# Patient Record
Sex: Male | Born: 1965 | Race: White | Hispanic: No | Marital: Single | Smoking: Never smoker
Health system: Southern US, Community
[De-identification: ages and names within clinical notes are randomized; demographics above are authoritative.]

## PROBLEM LIST (undated history)

## (undated) ENCOUNTER — Emergency Department (HOSPITAL_COMMUNITY): Payer: Medicaid Other

## (undated) DIAGNOSIS — S3992XA Unspecified injury of lower back, initial encounter: Secondary | ICD-10-CM

## (undated) DIAGNOSIS — E119 Type 2 diabetes mellitus without complications: Secondary | ICD-10-CM

## (undated) DIAGNOSIS — I10 Essential (primary) hypertension: Secondary | ICD-10-CM

## (undated) DIAGNOSIS — K5790 Diverticulosis of intestine, part unspecified, without perforation or abscess without bleeding: Secondary | ICD-10-CM

## (undated) DIAGNOSIS — G8929 Other chronic pain: Secondary | ICD-10-CM

## (undated) DIAGNOSIS — I639 Cerebral infarction, unspecified: Secondary | ICD-10-CM

## (undated) DIAGNOSIS — M549 Dorsalgia, unspecified: Secondary | ICD-10-CM

---

## 1999-11-16 ENCOUNTER — Emergency Department (HOSPITAL_COMMUNITY): Admission: EM | Admit: 1999-11-16 | Discharge: 1999-11-16 | Payer: Self-pay | Admitting: Emergency Medicine

## 2002-09-24 ENCOUNTER — Emergency Department (HOSPITAL_COMMUNITY): Admission: EM | Admit: 2002-09-24 | Discharge: 2002-09-24 | Payer: Self-pay | Admitting: Emergency Medicine

## 2006-03-27 ENCOUNTER — Emergency Department: Payer: Self-pay | Admitting: Emergency Medicine

## 2011-06-26 ENCOUNTER — Emergency Department (HOSPITAL_COMMUNITY): Payer: Self-pay

## 2011-06-26 ENCOUNTER — Emergency Department (HOSPITAL_COMMUNITY)
Admission: EM | Admit: 2011-06-26 | Discharge: 2011-06-27 | Disposition: A | Discharge status: Home / Self Care | Payer: No Typology Code available for payment source | Attending: Emergency Medicine | Admitting: Emergency Medicine

## 2011-06-26 DIAGNOSIS — M546 Pain in thoracic spine: Secondary | ICD-10-CM | POA: Insufficient documentation

## 2011-06-26 DIAGNOSIS — I10 Essential (primary) hypertension: Secondary | ICD-10-CM | POA: Insufficient documentation

## 2011-06-26 DIAGNOSIS — W208XXA Other cause of strike by thrown, projected or falling object, initial encounter: Secondary | ICD-10-CM | POA: Insufficient documentation

## 2011-06-26 DIAGNOSIS — R0789 Other chest pain: Secondary | ICD-10-CM | POA: Insufficient documentation

## 2011-06-26 LAB — URINALYSIS, DIPSTICK ONLY
Bilirubin Urine: NEGATIVE
Glucose, UA: NEGATIVE mg/dL
Hgb urine dipstick: NEGATIVE
Specific Gravity, Urine: 1.021 (ref 1.005–1.030)
pH: 6 (ref 5.0–8.0)

## 2011-06-26 LAB — POCT I-STAT, CHEM 8
BUN: 10 mg/dL (ref 6–23)
Chloride: 103 mEq/L (ref 96–112)
Potassium: 4.4 mEq/L (ref 3.5–5.1)
Sodium: 136 mEq/L (ref 135–145)

## 2011-06-26 LAB — CK TOTAL AND CKMB (NOT AT ARMC)
CK, MB: 2.5 ng/mL (ref 0.3–4.0)
Relative Index: 1.7 (ref 0.0–2.5)

## 2015-02-28 ENCOUNTER — Emergency Department: Payer: Self-pay | Admitting: Emergency Medicine

## 2015-02-28 LAB — CBC
HCT: 45.3 % (ref 40.0–52.0)
HGB: 15.3 g/dL (ref 13.0–18.0)
MCH: 30.1 pg (ref 26.0–34.0)
MCHC: 33.8 g/dL (ref 32.0–36.0)
MCV: 89 fL (ref 80–100)
PLATELETS: 208 10*3/uL (ref 150–440)
RBC: 5.1 10*6/uL (ref 4.40–5.90)
RDW: 13.2 % (ref 11.5–14.5)
WBC: 11.4 10*3/uL — AB (ref 3.8–10.6)

## 2015-02-28 LAB — BASIC METABOLIC PANEL
ANION GAP: 8 (ref 7–16)
BUN: 14 mg/dL
CALCIUM: 9 mg/dL
CHLORIDE: 105 mmol/L
Co2: 23 mmol/L
Creatinine: 0.75 mg/dL
EGFR (African American): >60
EGFR (Non-African Amer.): >60
GLUCOSE: 241 mg/dL — AB
POTASSIUM: 4 mmol/L
Sodium: 136 mmol/L

## 2015-02-28 LAB — TROPONIN I

## 2016-02-08 ENCOUNTER — Encounter: Payer: Self-pay | Admitting: Emergency Medicine

## 2016-02-08 ENCOUNTER — Emergency Department
Admission: EM | Admit: 2016-02-08 | Discharge: 2016-02-08 | Disposition: A | Discharge status: Home / Self Care | Payer: BLUE CROSS/BLUE SHIELD | Attending: Emergency Medicine | Admitting: Emergency Medicine

## 2016-02-08 ENCOUNTER — Emergency Department: Payer: BLUE CROSS/BLUE SHIELD

## 2016-02-08 DIAGNOSIS — X500XXA Overexertion from strenuous movement or load, initial encounter: Secondary | ICD-10-CM | POA: Diagnosis not present

## 2016-02-08 DIAGNOSIS — Y9289 Other specified places as the place of occurrence of the external cause: Secondary | ICD-10-CM | POA: Diagnosis not present

## 2016-02-08 DIAGNOSIS — I10 Essential (primary) hypertension: Secondary | ICD-10-CM | POA: Diagnosis not present

## 2016-02-08 DIAGNOSIS — M5441 Lumbago with sciatica, right side: Secondary | ICD-10-CM | POA: Diagnosis not present

## 2016-02-08 DIAGNOSIS — Y9389 Activity, other specified: Secondary | ICD-10-CM | POA: Diagnosis not present

## 2016-02-08 DIAGNOSIS — M5442 Lumbago with sciatica, left side: Secondary | ICD-10-CM | POA: Diagnosis not present

## 2016-02-08 DIAGNOSIS — Y998 Other external cause status: Secondary | ICD-10-CM | POA: Insufficient documentation

## 2016-02-08 DIAGNOSIS — S3992XA Unspecified injury of lower back, initial encounter: Secondary | ICD-10-CM | POA: Diagnosis present

## 2016-02-08 HISTORY — DX: Essential (primary) hypertension: I10

## 2016-02-08 MED ORDER — MELOXICAM 15 MG PO TABS: 15.0000 mg | ORAL_TABLET | Freq: Every day | ORAL | Status: DC

## 2016-02-08 MED ORDER — METHOCARBAMOL 500 MG PO TABS: 500.0000 mg | ORAL_TABLET | Freq: Four times a day (QID) | ORAL | Status: DC

## 2016-02-08 MED ORDER — HYDROCODONE-ACETAMINOPHEN 5-325 MG PO TABS
1.0000 | ORAL_TABLET | Freq: Once | ORAL | Status: AC
  Administered 2016-02-08: 1 via ORAL
  Filled 2016-02-08: qty 1

## 2016-02-08 MED ORDER — KETOROLAC TROMETHAMINE 60 MG/2ML IM SOLN
60.0000 mg | Freq: Once | INTRAMUSCULAR | Status: AC
  Administered 2016-02-08: 60 mg via INTRAMUSCULAR
  Filled 2016-02-08: qty 2

## 2016-02-08 NOTE — Discharge Instructions (Signed)

## 2016-02-08 NOTE — ED Notes (Signed)
Low back pain x 3 days. No new injury.

## 2016-02-08 NOTE — ED Notes (Signed)
Pt rreports that he broke his back a year ago and he has continued to have chronic back pain since then. He states that he felt a pop on Thursday when he was picking up something and he has had increased pain since then. He states it feels like burning and his feet go numb so he has to get up and walk around during the night.

## 2016-02-08 NOTE — ED Provider Notes (Signed)
[facility] Emergency Department Provider Note  ____________________________________________  Time seen: Approximately 3:14 PM  I have reviewed the triage vital signs and the nursing notes.   HISTORY  Chief Complaint Back Pain    HPI Theodore Hinton is a 50 y.o. male who presents emergency department complaining of lower back pain. Patient states that he had a broken back times one year ago. He has intermittent chronic pain but states that 4 days prior he was lifting something when he felt a "popping" sensation. He is now having radicular symptoms to bilateral lower extremities. He denies any bowel or bladder dysfunction. He denies any saddle anesthesia. Patient is complaining of increased lower back pain with radicular symptoms. It  constant, moderate to severe.   Past Medical History  Diagnosis Date  . Hypertension     There are no active problems to display for this patient.   History reviewed. No pertinent past surgical history.  Current Outpatient Rx  Name  Route  Sig  Dispense  Refill  . meloxicam (MOBIC) 15 MG tablet   Oral   Take 1 tablet (15 mg total) by mouth daily.   30 tablet   0   . methocarbamol (ROBAXIN) 500 MG tablet   Oral   Take 1 tablet (500 mg total) by mouth 4 (four) times daily.   16 tablet   0     Allergies Review of patient's allergies indicates no known allergies.  No family history on file.  Social History Social History  Substance Use Topics  . Smoking status: Never Smoker   . Smokeless tobacco: None  . Alcohol Use: Yes     Review of Systems  Constitutional: No fever/chills Cardiovascular: no chest pain. Respiratory: no cough. No SOB. Musculoskeletal:  positive for lower back pain with radicular symptoms. Skin: Negative for rash. Neurological: Negative for headaches, focal weakness or numbness. 10-point ROS otherwise negative.  ____________________________________________   PHYSICAL  EXAM:  VITAL SIGNS: ED Triage Vitals  Enc Vitals Group     BP 02/08/16 1248 161/101 mmHg     Pulse Rate 02/08/16 1248 86     Resp 02/08/16 1248 18     Temp 02/08/16 1248 98 F (36.7 C)     Temp Source 02/08/16 1248 Oral     SpO2 02/08/16 1248 98 %     Weight 02/08/16 1248 212 lb (96.163 kg)     Height 02/08/16 1248 5\' 8"  (1.727 m)     Head Cir --      Peak Flow --      Pain Score 02/08/16 1252 10     Pain Loc --      Pain Edu? --      Excl. in GC? --      Constitutional: Alert and oriented. Well appearing and in no acute distress. Eyes: Conjunctivae are normal. PERRL. EOMI. Head: Atraumatic. Cardiovascular: Normal rate, regular rhythm. Normal S1 and S2.  Good peripheral circulation. Respiratory: Normal respiratory effort without tachypnea or retractions. Lungs CTAB. Musculoskeletal:  no visible deformity to 2 spine upon inspection. Patient is tender to palpation midline spinal processes in the L1-L2 range. No palpable abnormality. Patient is tender to palpation of bilateral sciatic notches. Positive straight leg raise on the right negative on left. Dorsalis pedis pulses appreciated bilaterally lower extremities. Sensation equal and intact distal lower extremity. Neurologic:  Normal speech and language. No gross focal neurologic deficits are appreciated.  Skin:  Skin is warm, dry and intact. No rash  noted. Psychiatric: Mood and affect are normal. Speech and behavior are normal. Patient exhibits appropriate insight and judgement.   ____________________________________________   LABS (all labs ordered are listed, but only abnormal results are displayed)  Labs Reviewed - No data to display ____________________________________________  EKG   ____________________________________________  RADIOLOGY Festus Barren Jahnessa Vanduyn, personally viewed and evaluated these images (plain radiographs) as part of my medical decision making, as well as reviewing the written report by the  radiologist.  Dg Lumbar Spine Complete  02/08/2016  CLINICAL DATA:  Back pain since lifting four days ago. History of lumbar spine fracture in 2016. EXAM: LUMBAR SPINE - COMPLETE 4+ VIEW COMPARISON:  Radiographs and CT 02/28/2015. FINDINGS: There is no progressive loss of vertebral body height at the previously demonstrated L1 fracture. No evidence of acute lumbar spine fracture or progressive malalignment. There is stable lumbar spondylosis with mild loss of disc height and facet hypertrophy inferiorly. Paraspinal osteophytes are present on the right at L2-3. There is mild aortoiliac atherosclerosis. IMPRESSION: No acute osseous findings. Stable appearance of L1 compression deformity. Electronically Signed   By: Carey Bullocks M.D.   On: 02/08/2016 15:06    ____________________________________________    PROCEDURES  Procedure(s) performed:       Medications  HYDROcodone-acetaminophen (NORCO/VICODIN) 5-325 MG per tablet 1 tablet (1 tablet Oral Given 02/08/16 1429)  ketorolac (TORADOL) injection 60 mg (60 mg Intramuscular Given 02/08/16 1534)     ____________________________________________   INITIAL IMPRESSION / ASSESSMENT AND PLAN / ED COURSE  Pertinent labs & imaging results that were available during my care of the patient were reviewed by me and considered in my medical decision making (see chart for details).  Patient's diagnosis is consistent wilumbago with bilateral radicular symptoms. Negative x-ray here in the emergency department for any change in his compression fracture. Patient is given a shot of Toradol here in the emergency department. Patient will be discharged home with prescriptions for muscle relaxers and anti-inflammatories for symptom control. Patient is to follow up with his orthopedic surgeon if symptoms persist past this treatment course. Patient is given ED precautions to return to the ED for any worsening or new  symptoms.     ____________________________________________  FINAL CLINICAL IMPRESSION(S) / ED DIAGNOSES  Final diagnoses:  Acute midline low back pain with bilateral sciatica      NEW MEDICATIONS STARTED DURING THIS VISIT:  New Prescriptions   MELOXICAM (MOBIC) 15 MG TABLET    Take 1 tablet (15 mg total) by mouth daily.   METHOCARBAMOL (ROBAXIN) 500 MG TABLET    Take 1 tablet (500 mg total) by mouth 4 (four) times daily.        This chart was dictated using voice recognition software/Dragon. Despite best efforts to proofread, errors can occur which can change the meaning. Any change was purely unintentional.    Racheal Patches, PA-C 02/08/16 1554  Emily Filbert, MD 02/09/16 [phone]

## 2016-03-16 ENCOUNTER — Other Ambulatory Visit: Payer: Self-pay | Admitting: Orthopedic Surgery

## 2016-03-16 DIAGNOSIS — M5440 Lumbago with sciatica, unspecified side: Secondary | ICD-10-CM

## 2016-03-28 ENCOUNTER — Ambulatory Visit
Admission: RE | Admit: 2016-03-28 | Discharge: 2016-03-28 | Disposition: A | Discharge status: Home / Self Care | Payer: BLUE CROSS/BLUE SHIELD | Source: Ambulatory Visit | Attending: Orthopedic Surgery | Admitting: Orthopedic Surgery

## 2016-03-28 DIAGNOSIS — M5136 Other intervertebral disc degeneration, lumbar region: Secondary | ICD-10-CM | POA: Insufficient documentation

## 2016-03-28 DIAGNOSIS — M4806 Spinal stenosis, lumbar region: Secondary | ICD-10-CM | POA: Diagnosis not present

## 2016-03-28 DIAGNOSIS — M5440 Lumbago with sciatica, unspecified side: Secondary | ICD-10-CM

## 2016-03-28 DIAGNOSIS — M4856XA Collapsed vertebra, not elsewhere classified, lumbar region, initial encounter for fracture: Secondary | ICD-10-CM | POA: Diagnosis not present

## 2016-03-28 DIAGNOSIS — M5146 Schmorl's nodes, lumbar region: Secondary | ICD-10-CM | POA: Insufficient documentation

## 2016-03-28 DIAGNOSIS — M545 Low back pain: Secondary | ICD-10-CM | POA: Insufficient documentation

## 2016-04-06 ENCOUNTER — Other Ambulatory Visit: Payer: Self-pay | Admitting: Orthopedic Surgery

## 2016-04-06 DIAGNOSIS — M545 Low back pain: Secondary | ICD-10-CM

## 2016-04-06 DIAGNOSIS — M5126 Other intervertebral disc displacement, lumbar region: Secondary | ICD-10-CM

## 2016-04-06 DIAGNOSIS — M5136 Other intervertebral disc degeneration, lumbar region: Secondary | ICD-10-CM

## 2016-04-06 DIAGNOSIS — G8929 Other chronic pain: Secondary | ICD-10-CM

## 2016-04-12 ENCOUNTER — Ambulatory Visit
Admission: RE | Admit: 2016-04-12 | Discharge: 2016-04-12 | Disposition: A | Discharge status: Home / Self Care | Payer: Self-pay | Source: Ambulatory Visit | Attending: Orthopedic Surgery | Admitting: Orthopedic Surgery

## 2016-04-12 DIAGNOSIS — M545 Low back pain, unspecified: Secondary | ICD-10-CM

## 2016-04-12 DIAGNOSIS — M5126 Other intervertebral disc displacement, lumbar region: Secondary | ICD-10-CM

## 2016-04-12 DIAGNOSIS — M5136 Other intervertebral disc degeneration, lumbar region: Secondary | ICD-10-CM

## 2016-04-12 DIAGNOSIS — G8929 Other chronic pain: Secondary | ICD-10-CM

## 2016-04-12 MED ORDER — METHYLPREDNISOLONE ACETATE 40 MG/ML INJ SUSP (RADIOLOG
120.0000 mg | Freq: Once | INTRAMUSCULAR | Status: AC
  Administered 2016-04-12: 120 mg via EPIDURAL

## 2016-04-12 MED ORDER — IOPAMIDOL (ISOVUE-M 200) INJECTION 41%
1.0000 mL | Freq: Once | INTRAMUSCULAR | Status: AC
  Administered 2016-04-12: 1 mL via EPIDURAL

## 2016-04-12 NOTE — Discharge Instructions (Signed)

## 2016-07-19 ENCOUNTER — Emergency Department (HOSPITAL_COMMUNITY)
Admission: EM | Admit: 2016-07-19 | Discharge: 2016-07-19 | Disposition: A | Discharge status: Home / Self Care | Payer: Self-pay | Attending: Emergency Medicine | Admitting: Emergency Medicine

## 2016-07-19 ENCOUNTER — Emergency Department (HOSPITAL_COMMUNITY): Payer: Self-pay

## 2016-07-19 ENCOUNTER — Encounter (HOSPITAL_COMMUNITY): Payer: Self-pay | Admitting: Emergency Medicine

## 2016-07-19 DIAGNOSIS — R52 Pain, unspecified: Secondary | ICD-10-CM

## 2016-07-19 DIAGNOSIS — M5440 Lumbago with sciatica, unspecified side: Secondary | ICD-10-CM | POA: Insufficient documentation

## 2016-07-19 DIAGNOSIS — I10 Essential (primary) hypertension: Secondary | ICD-10-CM | POA: Insufficient documentation

## 2016-07-19 DIAGNOSIS — Z79899 Other long term (current) drug therapy: Secondary | ICD-10-CM | POA: Insufficient documentation

## 2016-07-19 DIAGNOSIS — K921 Melena: Secondary | ICD-10-CM | POA: Insufficient documentation

## 2016-07-19 HISTORY — DX: Unspecified injury of lower back, initial encounter: S39.92XA

## 2016-07-19 LAB — COMPREHENSIVE METABOLIC PANEL
ALBUMIN: 4 g/dL (ref 3.5–5.0)
ALK PHOS: 68 U/L (ref 38–126)
ALT: 35 U/L (ref 17–63)
ANION GAP: 8 (ref 5–15)
AST: 26 U/L (ref 15–41)
BILIRUBIN TOTAL: 1.8 mg/dL — AB (ref 0.3–1.2)
BUN: 11 mg/dL (ref 6–20)
CALCIUM: 9 mg/dL (ref 8.9–10.3)
CO2: 23 mmol/L (ref 22–32)
CREATININE: 0.83 mg/dL (ref 0.61–1.24)
Chloride: 102 mmol/L (ref 101–111)
GFR calc Af Amer: >60 mL/min (ref >60)
GFR calc non Af Amer: >60 mL/min (ref >60)
GLUCOSE: 259 mg/dL — AB (ref 65–99)
Potassium: 3.6 mmol/L (ref 3.5–5.1)
Sodium: 133 mmol/L — ABNORMAL LOW (ref 135–145)
TOTAL PROTEIN: 7.3 g/dL (ref 6.5–8.1)

## 2016-07-19 LAB — URINALYSIS, ROUTINE W REFLEX MICROSCOPIC
Glucose, UA: NEGATIVE mg/dL
Hgb urine dipstick: NEGATIVE
Leukocytes, UA: NEGATIVE
NITRITE: NEGATIVE
PROTEIN: 30 mg/dL — AB
Specific Gravity, Urine: 1.025 (ref 1.005–1.030)
pH: 6 (ref 5.0–8.0)

## 2016-07-19 LAB — CBC
HCT: 52.1 % — ABNORMAL HIGH (ref 39.0–52.0)
HEMOGLOBIN: 18.9 g/dL — AB (ref 13.0–17.0)
MCH: 31.9 pg (ref 26.0–34.0)
MCHC: 36.3 g/dL — AB (ref 30.0–36.0)
MCV: 88 fL (ref 78.0–100.0)
PLATELETS: 221 10*3/uL (ref 150–400)
RBC: 5.92 MIL/uL — ABNORMAL HIGH (ref 4.22–5.81)
RDW: 12.9 % (ref 11.5–15.5)
WBC: 11.4 10*3/uL — ABNORMAL HIGH (ref 4.0–10.5)

## 2016-07-19 LAB — URINE MICROSCOPIC-ADD ON

## 2016-07-19 MED ORDER — PREDNISONE 10 MG PO TABS: 20.0000 mg | ORAL_TABLET | Freq: Every day | ORAL | 0 refills | Status: DC

## 2016-07-19 MED ORDER — ONDANSETRON 4 MG PO TBDP
ORAL_TABLET | ORAL | 0 refills | Status: DC
Start: 2016-07-19 — End: 2020-12-01

## 2016-07-19 MED ORDER — HYDROMORPHONE HCL 1 MG/ML IJ SOLN
1.0000 mg | Freq: Once | INTRAMUSCULAR | Status: AC
  Administered 2016-07-19: 1 mg via INTRAVENOUS
  Filled 2016-07-19: qty 1

## 2016-07-19 MED ORDER — HYDROCODONE-ACETAMINOPHEN 5-325 MG PO TABS: 1.0000 | ORAL_TABLET | Freq: Four times a day (QID) | ORAL | 0 refills | Status: DC | PRN

## 2016-07-19 MED ORDER — LISINOPRIL 20 MG PO TABS: 20.0000 mg | ORAL_TABLET | Freq: Every day | ORAL | 1 refills | Status: DC

## 2016-07-19 MED ORDER — LISINOPRIL 10 MG PO TABS
20.0000 mg | ORAL_TABLET | Freq: Once | ORAL | Status: AC
  Administered 2016-07-19: 20 mg via ORAL
  Filled 2016-07-19: qty 2

## 2016-07-19 MED ORDER — METHYLPREDNISOLONE SODIUM SUCC 125 MG IJ SOLR
125.0000 mg | Freq: Once | INTRAMUSCULAR | Status: AC
  Administered 2016-07-19: 125 mg via INTRAVENOUS
  Filled 2016-07-19: qty 2

## 2016-07-19 MED ORDER — ONDANSETRON HCL 4 MG/2ML IJ SOLN
4.0000 mg | Freq: Once | INTRAMUSCULAR | Status: AC
  Administered 2016-07-19: 4 mg via INTRAVENOUS
  Filled 2016-07-19: qty 2

## 2016-07-19 NOTE — ED Triage Notes (Signed)
Patient complaining of back pain x 2 days with rectal bleeding starting today. States he has history of back problems.

## 2016-07-19 NOTE — Discharge Instructions (Signed)
Follow-up with your family doctor next week. He needs to check your back your blood pressure and keep an eye on your sugar.

## 2016-07-19 NOTE — ED Provider Notes (Signed)
[facility]-EMERGENCY DEPT Provider Note   CSN: [id number] Arrival date & time: 07/19/16  1706     History   Chief Complaint Chief Complaint  Patient presents with  . Back Pain  . Rectal Bleeding    HPI Theodore Hinton is a 50 y.o. male.  Patient complains of pain in his lower back radiating down both legs. Patient has had long history of back problems and has had injections in his back before   The history is provided by the patient. No language interpreter was used.  Back Pain   This is a recurrent problem. The problem occurs constantly. The problem has not changed since onset.The pain is associated with no known injury. The pain is present in the lumbar spine. The quality of the pain is described as burning and aching. Radiates to: Radiates down both legs. The pain is at a severity of 6/10. The pain is moderate. The symptoms are aggravated by bending. The pain is the same all the time. Pertinent negatives include no chest pain, no headaches and no abdominal pain. He has tried NSAIDs for the symptoms. The treatment provided no relief. Risk factors include lack of exercise.  Rectal Bleeding  Associated symptoms: no abdominal pain     Past Medical History:  Diagnosis Date  . Back injury   . Hypertension     There are no active problems to display for this patient.   History reviewed. No pertinent surgical history.     Home Medications    Prior to Admission medications   Medication Sig Start Date End Date Taking? Authorizing Provider  gabapentin (NEURONTIN) 300 MG capsule Take by mouth. 03/09/16 05/08/16  Historical Provider, MD  HYDROcodone-acetaminophen (NORCO/VICODIN) 5-325 MG tablet Take 1 tablet by mouth every 6 (six) hours as needed. 07/19/16   Bethann BerkshireJoseph Amylia Collazos, MD  lisinopril (PRINIVIL,ZESTRIL) 20 MG tablet Take 1 tablet (20 mg total) by mouth daily. 07/19/16   Bethann BerkshireJoseph Elior Robinette, MD  meloxicam (MOBIC) 15 MG tablet Take 1 tablet (15 mg total) by mouth daily. 02/08/16   Delorise RoyalsJonathan  D Cuthriell, PA-C  methocarbamol (ROBAXIN) 500 MG tablet Take 1 tablet (500 mg total) by mouth 4 (four) times daily. 02/08/16   Delorise RoyalsJonathan D Cuthriell, PA-C  ondansetron (ZOFRAN ODT) 4 MG disintegrating tablet 4mg  ODT q4 hours prn nausea/vomit 07/19/16   Bethann BerkshireJoseph Edmund Holcomb, MD  predniSONE (DELTASONE) 10 MG tablet Take 2 tablets (20 mg total) by mouth daily. 07/19/16   Bethann BerkshireJoseph Makailyn Mccormick, MD  traMADol (ULTRAM) 50 MG tablet Take by mouth. 03/09/16   Historical Provider, MD    Family History History reviewed. No pertinent family history.  Social History Social History  Substance Use Topics  . Smoking status: Never Smoker  . Smokeless tobacco: Current User    Types: Chew  . Alcohol use Yes     Comment: occasionally     Allergies   Review of patient's allergies indicates no known allergies.   Review of Systems Review of Systems  Constitutional: Negative for appetite change and fatigue.  HENT: Negative for congestion, ear discharge and sinus pressure.   Eyes: Negative for discharge.  Respiratory: Negative for cough.   Cardiovascular: Negative for chest pain.  Gastrointestinal: Positive for hematochezia. Negative for abdominal pain and diarrhea.  Genitourinary: Negative for frequency and hematuria.  Musculoskeletal: Positive for back pain.  Skin: Negative for rash.  Neurological: Negative for seizures and headaches.  Psychiatric/Behavioral: Negative for hallucinations.     Physical Exam Updated Vital Signs BP (S) (!) 159/118 (BP  Location: Left Arm) Comment: taken twice for validation  Pulse 94   Temp 98.9 F (37.2 C) (Oral)   Resp 16   Ht 5\' 8"  (1.727 m)   Wt 198 lb (89.8 kg)   SpO2 99%   BMI 30.11 kg/m   Physical Exam  Constitutional: He is oriented to person, place, and time. He appears well-developed.  HENT:  Head: Normocephalic.  Eyes: Conjunctivae and EOM are normal. No scleral icterus.  Neck: Neck supple. No thyromegaly present.  Cardiovascular: Normal rate and regular  rhythm.  Exam reveals no gallop and no friction rub.   No murmur heard. Pulmonary/Chest: No stridor. He has no wheezes. He has no rales. He exhibits no tenderness.  Abdominal: He exhibits no distension. There is no tenderness. There is no rebound.  Musculoskeletal: Normal range of motion. He exhibits tenderness. He exhibits no edema.  Tenderness to lumbar spine,  patient has positive straight leg raise to both legs. Normal strength  Lymphadenopathy:    He has no cervical adenopathy.  Neurological: He is oriented to person, place, and time. He exhibits normal muscle tone. Coordination normal.  Skin: No rash noted. No erythema.  Psychiatric: He has a normal mood and affect. His behavior is normal.     ED Treatments / Results  Labs (all labs ordered are listed, but only abnormal results are displayed) Labs Reviewed  COMPREHENSIVE METABOLIC PANEL - Abnormal; Notable for the following:       Result Value   Sodium 133 (*)    Glucose, Bld 259 (*)    Total Bilirubin 1.8 (*)    All other components within normal limits  CBC - Abnormal; Notable for the following:    WBC 11.4 (*)    RBC 5.92 (*)    Hemoglobin 18.9 (*)    HCT 52.1 (*)    MCHC 36.3 (*)    All other components within normal limits  URINALYSIS, ROUTINE W REFLEX MICROSCOPIC (NOT AT [facility]) - Abnormal; Notable for the following:    Bilirubin Urine MODERATE (*)    Ketones, ur TRACE (*)    Protein, ur 30 (*)    All other components within normal limits  URINE MICROSCOPIC-ADD ON - Abnormal; Notable for the following:    Squamous Epithelial / LPF 6-30 (*)    Bacteria, UA RARE (*)    All other components within normal limits  TYPE AND SCREEN    EKG  EKG Interpretation None       Radiology Mr Lumbar Spine Wo Contrast  Result Date: 07/19/2016 CLINICAL DATA:  Low back pain over the last few days without known injury. EXAM: MRI LUMBAR SPINE WITHOUT CONTRAST TECHNIQUE: Multiplanar, multisequence MR imaging of the lumbar spine  was performed. No intravenous contrast was administered. COMPARISON:  MRI of the lumbar spine 03/28/2016. FINDINGS: Segmentation: 5 non rib-bearing lumbar type vertebral bodies are present. Alignment: AP alignment is anatomic. Mild leftward curvature is centered at L2. Vertebrae: A remote superior endplate compression fracture is noted at L1. Edematous changes along the left inferior aspect of L4 associated with a Schmorl's node, similar to the prior study. Conus medullaris: Extends to the L1-2 level and appears normal. Paraspinal and other soft tissues: Limited imaging of the abdomen is unremarkable. There is no significant adenopathy. The paraspinous musculature is within normal limits. Disc levels: T12-L1:  Negative. L1-2: Mild facet hypertrophy is present without significant stenosis. L2-3: A broad-based disc protrusion is asymmetric to the right. Mild subarticular narrowing is present bilaterally. The  foramina are patent. L3-4: A broad-based disc protrusion is present. Mild facet hypertrophy is noted bilaterally. Mild central and foraminal narrowing bilaterally is stable. L4-5: A broad-based disc protrusion is present. Short pedicles and facet hypertrophy contribute to moderate subarticular narrowing bilaterally. Mild foraminal narrowing bilaterally is stable. L5-S1: A mild broad-based disc protrusion is present. Facet hypertrophy and spurring is worse right than left. Mild subarticular narrowing is worse on the right. Moderate right and mild left foraminal stenosis is stable. IMPRESSION: 1. 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 2. Mild subarticular narrowing bilaterally at L2-3. 3. Mild central and bilateral foraminal stenosis at L3-4. 4. Moderate subarticular and mild foraminal narrowing bilaterally at L4-5. 5. Mild subarticular narrowing at L5-S1 is worse on the right. 6. Moderate right and mild left foraminal stenosis at L5-S1 is stable.  Electronically Signed   By: Marin Robertshristopher  Mattern M.D.   On: 07/19/2016 20:38    Procedures Procedures (including critical care time)  Medications Ordered in ED Medications  lisinopril (PRINIVIL,ZESTRIL) tablet 20 mg (not administered)  HYDROmorphone (DILAUDID) injection 1 mg (1 mg Intravenous Given 07/19/16 1945)  ondansetron (ZOFRAN) injection 4 mg (4 mg Intravenous Given 07/19/16 1945)  methylPREDNISolone sodium succinate (SOLU-MEDROL) 125 mg/2 mL injection 125 mg (125 mg Intravenous Given 07/19/16 1945)     Initial Impression / Assessment and Plan / ED Course  I have reviewed the triage vital signs and the nursing notes.  Pertinent labs & imaging results that were available during my care of the patient were reviewed by me and considered in my medical decision making (see chart for details).  Clinical Course    MRI shows chronic problems with back. No acute changes. Patient will be put on steroids and pain medicine and nausea medicine. Patient's blood pressure is elevated he will be started on lisinopril. He states he's been told numerous times blood pressure has been elevated but has not started any medicine. He also has an elevated glucose he will follow-up with his PCP about that. Patient will follow-up with PCP next week  Final Clinical Impressions(s) / ED Diagnoses   Final diagnoses:  Pain  Midline low back pain with sciatica, sciatica laterality unspecified  HTN (hypertension), benign    New Prescriptions New Prescriptions   HYDROCODONE-ACETAMINOPHEN (NORCO/VICODIN) 5-325 MG TABLET    Take 1 tablet by mouth every 6 (six) hours as needed.   LISINOPRIL (PRINIVIL,ZESTRIL) 20 MG TABLET    Take 1 tablet (20 mg total) by mouth daily.   ONDANSETRON (ZOFRAN ODT) 4 MG DISINTEGRATING TABLET    4mg  ODT q4 hours prn nausea/vomit   PREDNISONE (DELTASONE) 10 MG TABLET    Take 2 tablets (20 mg total) by mouth daily.     Bethann BerkshireJoseph Vishnu Moeller, MD 07/19/16 2200

## 2016-07-20 ENCOUNTER — Emergency Department (HOSPITAL_COMMUNITY)
Admission: EM | Admit: 2016-07-20 | Discharge: 2016-07-20 | Disposition: A | Discharge status: Home / Self Care | Payer: Self-pay | Attending: Emergency Medicine | Admitting: Emergency Medicine

## 2016-07-20 ENCOUNTER — Encounter (HOSPITAL_COMMUNITY): Payer: Self-pay | Admitting: Cardiology

## 2016-07-20 ENCOUNTER — Emergency Department (HOSPITAL_COMMUNITY): Payer: Self-pay

## 2016-07-20 DIAGNOSIS — I10 Essential (primary) hypertension: Secondary | ICD-10-CM | POA: Insufficient documentation

## 2016-07-20 DIAGNOSIS — R197 Diarrhea, unspecified: Secondary | ICD-10-CM

## 2016-07-20 DIAGNOSIS — F17220 Nicotine dependence, chewing tobacco, uncomplicated: Secondary | ICD-10-CM | POA: Insufficient documentation

## 2016-07-20 DIAGNOSIS — G8929 Other chronic pain: Secondary | ICD-10-CM | POA: Insufficient documentation

## 2016-07-20 DIAGNOSIS — K5792 Diverticulitis of intestine, part unspecified, without perforation or abscess without bleeding: Secondary | ICD-10-CM | POA: Insufficient documentation

## 2016-07-20 DIAGNOSIS — Z79899 Other long term (current) drug therapy: Secondary | ICD-10-CM | POA: Insufficient documentation

## 2016-07-20 DIAGNOSIS — R739 Hyperglycemia, unspecified: Secondary | ICD-10-CM | POA: Insufficient documentation

## 2016-07-20 DIAGNOSIS — R112 Nausea with vomiting, unspecified: Secondary | ICD-10-CM

## 2016-07-20 DIAGNOSIS — M549 Dorsalgia, unspecified: Secondary | ICD-10-CM | POA: Insufficient documentation

## 2016-07-20 HISTORY — DX: Other chronic pain: G89.29

## 2016-07-20 HISTORY — DX: Diverticulosis of intestine, part unspecified, without perforation or abscess without bleeding: K57.90

## 2016-07-20 HISTORY — DX: Dorsalgia, unspecified: M54.9

## 2016-07-20 LAB — COMPREHENSIVE METABOLIC PANEL
ALT: 40 U/L (ref 17–63)
AST: 25 U/L (ref 15–41)
Albumin: 4.3 g/dL (ref 3.5–5.0)
Alkaline Phosphatase: 69 U/L (ref 38–126)
Anion gap: 13 (ref 5–15)
BUN: 13 mg/dL (ref 6–20)
CHLORIDE: 98 mmol/L — AB (ref 101–111)
CO2: 22 mmol/L (ref 22–32)
CREATININE: 0.84 mg/dL (ref 0.61–1.24)
Calcium: 10.9 mg/dL — ABNORMAL HIGH (ref 8.9–10.3)
GFR calc Af Amer: >60 mL/min (ref >60)
GLUCOSE: 356 mg/dL — AB (ref 65–99)
Potassium: 3.6 mmol/L (ref 3.5–5.1)
Sodium: 133 mmol/L — ABNORMAL LOW (ref 135–145)
Total Bilirubin: 1.6 mg/dL — ABNORMAL HIGH (ref 0.3–1.2)
Total Protein: 7.8 g/dL (ref 6.5–8.1)

## 2016-07-20 LAB — CBC WITH DIFFERENTIAL/PLATELET
Basophils Absolute: 0 10*3/uL (ref 0.0–0.1)
Basophils Relative: 0 %
Eosinophils Absolute: 0 10*3/uL (ref 0.0–0.7)
Eosinophils Relative: 0 %
HEMATOCRIT: 54.5 % — AB (ref 39.0–52.0)
Hemoglobin: 19.9 g/dL — ABNORMAL HIGH (ref 13.0–17.0)
LYMPHS ABS: 2.3 10*3/uL (ref 0.7–4.0)
LYMPHS PCT: 16 %
MCH: 31.7 pg (ref 26.0–34.0)
MCHC: 36.5 g/dL — AB (ref 30.0–36.0)
MCV: 86.8 fL (ref 78.0–100.0)
MONO ABS: 0.7 10*3/uL (ref 0.1–1.0)
MONOS PCT: 5 %
NEUTROS ABS: 11.1 10*3/uL — AB (ref 1.7–7.7)
Neutrophils Relative %: 79 %
Platelets: 257 10*3/uL (ref 150–400)
RBC: 6.28 MIL/uL — ABNORMAL HIGH (ref 4.22–5.81)
RDW: 12.8 % (ref 11.5–15.5)
WBC: 14 10*3/uL — ABNORMAL HIGH (ref 4.0–10.5)

## 2016-07-20 LAB — POC OCCULT BLOOD, ED: Fecal Occult Bld: NEGATIVE

## 2016-07-20 LAB — URINALYSIS, ROUTINE W REFLEX MICROSCOPIC
BILIRUBIN URINE: NEGATIVE
GLUCOSE, UA: 500 mg/dL — AB
Hgb urine dipstick: NEGATIVE
KETONES UR: NEGATIVE mg/dL
Leukocytes, UA: NEGATIVE
Nitrite: NEGATIVE
Protein, ur: NEGATIVE mg/dL
pH: 6.5 (ref 5.0–8.0)

## 2016-07-20 LAB — CBG MONITORING, ED: GLUCOSE-CAPILLARY: 266 mg/dL — AB (ref 65–99)

## 2016-07-20 LAB — LIPASE, BLOOD: LIPASE: 33 U/L (ref 11–51)

## 2016-07-20 LAB — TROPONIN I: Troponin I: <0.03 ng/mL (ref <0.03)

## 2016-07-20 MED ORDER — IOPAMIDOL (ISOVUE-300) INJECTION 61%
100.0000 mL | Freq: Once | INTRAVENOUS | Status: AC | PRN
  Administered 2016-07-20: 100 mL via INTRAVENOUS

## 2016-07-20 MED ORDER — DIATRIZOATE MEGLUMINE & SODIUM 66-10 % PO SOLN
ORAL | Status: AC
  Filled 2016-07-20: qty 30

## 2016-07-20 MED ORDER — SODIUM CHLORIDE 0.9 % IV BOLUS (SEPSIS)
1000.0000 mL | Freq: Once | INTRAVENOUS | Status: AC
  Administered 2016-07-20: 1000 mL via INTRAVENOUS

## 2016-07-20 MED ORDER — LISINOPRIL 10 MG PO TABS
10.0000 mg | ORAL_TABLET | Freq: Once | ORAL | Status: AC
  Administered 2016-07-20: 10 mg via ORAL
  Filled 2016-07-20: qty 1

## 2016-07-20 MED ORDER — FENTANYL CITRATE (PF) 100 MCG/2ML IJ SOLN
50.0000 ug | INTRAMUSCULAR | Status: DC | PRN
  Administered 2016-07-20: 50 ug via INTRAVENOUS
  Filled 2016-07-20: qty 2

## 2016-07-20 MED ORDER — ONDANSETRON HCL 4 MG/2ML IJ SOLN
4.0000 mg | INTRAMUSCULAR | Status: DC | PRN
  Administered 2016-07-20: 4 mg via INTRAVENOUS
  Filled 2016-07-20: qty 2

## 2016-07-20 MED ORDER — ONDANSETRON HCL 4 MG PO TABS: 4.0000 mg | ORAL_TABLET | Freq: Three times a day (TID) | ORAL | 0 refills | Status: DC | PRN

## 2016-07-20 MED ORDER — SODIUM CHLORIDE 0.9 % IV SOLN: INTRAVENOUS | Status: DC

## 2016-07-20 MED ORDER — FAMOTIDINE IN NACL 20-0.9 MG/50ML-% IV SOLN
20.0000 mg | Freq: Once | INTRAVENOUS | Status: AC
  Administered 2016-07-20: 20 mg via INTRAVENOUS
  Filled 2016-07-20: qty 50

## 2016-07-20 NOTE — Discharge Instructions (Signed)
Eat a bland diet, avoiding greasy, fatty, fried foods, as well as spicy and acidic foods or beverages.  Avoid eating within the hour or 2 before going to bed or laying down.  Also avoid teas, colas, coffee, chocolate, pepermint and spearment.  Take over the counter pepcid, one tablet by mouth twice a day, for the next 3 to 4 weeks.  May also take over the counter maalox/mylanta, as directed on packaging, as needed for discomfort.  Take the prescription as directed.  Call your regular medical doctor today to schedule a follow up appointment in the next 2 days to re-check your blood pressure and obtain further testing regarding your elevated blood sugar. Call the GI doctor today to schedule a follow up appointment within the next week.  Return to the Emergency Department immediately if worsening.

## 2016-07-20 NOTE — ED Provider Notes (Signed)
[facility] DEPT Provider Note   CSN: [id number] Arrival date & time: 07/20/16  1044     History   Chief Complaint Chief Complaint  Patient presents with  . Emesis    HPI Theodore Hinton is a 50 y.o. male.  HPI  Pt was seen at 1100. Per pt, c/o gradual onset and persistence of multiple intermittent episodes of N/V/D that began yesterday.   Describes the stools as "seeing blood in them," and describes the emesis as "dark" since yesterday.  Denies abd pain, no CP/SOB, no back pain, no fevers.  Pt also c/o gradual onset and persistence of constant acute flair of his chronic low back "pain" for the past several days.  Denies any change in his usual chronic pain pattern.  Pain worsens with palpation of the area and body position changes. Denies incont/retention of bowel or bladder, no saddle anesthesia, no focal motor weakness, no tingling/numbness in extremities, no fevers, no injury, no abd pain. The patient has a significant history of similar symptoms previously, recently being evaluated for this complaint and multiple prior evals for same. Pt was evaluated in the ED for this complaint yesterday with reassuring MRI LS.      Past Medical History:  Diagnosis Date  . Back injury   . Chronic back pain   . Diverticulosis   . Hypertension     There are no active problems to display for this patient.   History reviewed. No pertinent surgical history.     Home Medications    Prior to Admission medications   Medication Sig Start Date End Date Taking? Authorizing Provider  gabapentin (NEURONTIN) 300 MG capsule Take by mouth. 03/09/16 05/08/16  Historical Provider, MD  HYDROcodone-acetaminophen (NORCO/VICODIN) 5-325 MG tablet Take 1 tablet by mouth every 6 (six) hours as needed. 07/19/16   Bethann BerkshireJoseph Zammit, MD  lisinopril (PRINIVIL,ZESTRIL) 20 MG tablet Take 1 tablet (20 mg total) by mouth daily. 07/19/16   Bethann BerkshireJoseph Zammit, MD  meloxicam (MOBIC) 15 MG tablet Take 1 tablet (15 mg total) by  mouth daily. 02/08/16   Delorise RoyalsJonathan D Cuthriell, PA-C  methocarbamol (ROBAXIN) 500 MG tablet Take 1 tablet (500 mg total) by mouth 4 (four) times daily. 02/08/16   Delorise RoyalsJonathan D Cuthriell, PA-C  ondansetron (ZOFRAN ODT) 4 MG disintegrating tablet 4mg  ODT q4 hours prn nausea/vomit 07/19/16   Bethann BerkshireJoseph Zammit, MD  predniSONE (DELTASONE) 10 MG tablet Take 2 tablets (20 mg total) by mouth daily. 07/19/16   Bethann BerkshireJoseph Zammit, MD  traMADol (ULTRAM) 50 MG tablet Take by mouth. 03/09/16   Historical Provider, MD    Family History History reviewed. No pertinent family history.  Social History Social History  Substance Use Topics  . Smoking status: Never Smoker  . Smokeless tobacco: Current User    Types: Chew  . Alcohol use Yes     Comment: occasionally     Allergies   Review of patient's allergies indicates no known allergies.   Review of Systems Review of Systems ROS: Statement: All systems negative except as marked or noted in the HPI; Constitutional: Negative for fever and chills. ; ; Eyes: Negative for eye pain, redness and discharge. ; ; ENMT: Negative for ear pain, hoarseness, nasal congestion, sinus pressure and sore throat. ; ; Cardiovascular: Negative for chest pain, palpitations, diaphoresis, dyspnea and peripheral edema. ; ; Respiratory: Negative for cough, wheezing and stridor. ; ; Gastrointestinal: +N/V/D, "dark emesis," "blood in stool." Negative for abdominal pain, hematemesis, jaundice and rectal bleeding. . ; ; Genitourinary: Negative  for dysuria, flank pain and hematuria. ; ; Musculoskeletal: +chronic LBP. Negative for neck pain. Negative for swelling and trauma.; ; Skin: Negative for pruritus, rash, abrasions, blisters, bruising and skin lesion.; ; Neuro: Negative for headache, lightheadedness and neck stiffness. Negative for weakness, altered level of consciousness, altered mental status, extremity weakness, paresthesias, involuntary movement, seizure and syncope.      Physical Exam Updated  Vital Signs BP (!) 161/125   Pulse 113   Temp 98 F (36.7 C) (Oral)   Resp 19   Ht 5\' 8"  (1.727 m)   Wt 198 lb (89.8 kg)   SpO2 95%   BMI 30.11 kg/m   BP 144/99   Pulse 88   Temp 98 F (36.7 C) (Oral)   Resp 21   Ht 5\' 8"  (1.727 m)   Wt 198 lb (89.8 kg)   SpO2 98%   BMI 30.11 kg/m    Physical Exam 1105: Physical examination:  Nursing notes reviewed; Vital signs and O2 SAT reviewed;  Constitutional: Well developed, Well nourished, Well hydrated, In no acute distress; Head:  Normocephalic, atraumatic; Eyes: EOMI, PERRL, No scleral icterus; ENMT: Mouth and pharynx normal, Mucous membranes moist; Neck: Supple, Full range of motion, No lymphadenopathy; Cardiovascular: Tachycardic rate and rhythm, No gallop; Respiratory: Breath sounds clear & equal bilaterally, No wheezes.  Speaking full sentences with ease, Normal respiratory effort/excursion; Chest: Nontender, Movement normal; Abdomen: Soft, +mild diffuse tenderness to palp. No rebound or guarding. Nondistended, Normal bowel sounds. Rectal exam performed w/permission of pt and ED RN chaperone present.  Anal tone normal.  Non-tender, soft brown stool in rectal vault, heme neg.  No fissures, no external hemorrhoids, no palp masses.;; Spine:  No midline CS, TS, LS tenderness. +TTP bilat lumbar paraspinal muscles.;; Genitourinary: No CVA tenderness; Extremities: Pulses normal, No tenderness, No edema, No calf edema or asymmetry.; Neuro: AA&Ox3, Major CN grossly intact.  Speech clear. No gross focal motor or sensory deficits in extremities.; Skin: Color normal, Warm, Dry.   ED Treatments / Results  Labs (all labs ordered are listed, but only abnormal results are displayed)   EKG  EKG Interpretation  Date/Time:  Wednesday July 20 2016 10:53:52 EDT Ventricular Rate:  108 PR Interval:    QRS Duration: 91 QT Interval:  330 QTC Calculation: 443 R Axis:   -80 Text Interpretation:  Sinus tachycardia Probable left atrial enlargement  Inferior infarct, old Probable anterior infarct, age indeterminate When compared with ECG of 02/28/2015 Rate faster Otherwise no significant change Confirmed by [facility]  MD, Nicholos Johns [phone]) on 07/20/2016 12:39:31 PM       Radiology   Procedures Procedures (including critical care time)  Medications Ordered in ED Medications  fentaNYL (SUBLIMAZE) injection 50 mcg (50 mcg Intravenous Given 07/20/16 1123)  ondansetron (ZOFRAN) injection 4 mg (4 mg Intravenous Given 07/20/16 1123)  0.9 %  sodium chloride infusion (not administered)  diatrizoate meglumine-sodium (GASTROGRAFIN) 66-10 % solution (not administered)  famotidine (PEPCID) IVPB 20 mg premix (20 mg Intravenous New Bag/Given 07/20/16 1248)  sodium chloride 0.9 % bolus 1,000 mL (0 mLs Intravenous Stopped 07/20/16 1226)  iopamidol (ISOVUE-300) 61 % injection 100 mL (100 mLs Intravenous Contrast Given 07/20/16 1159)  sodium chloride 0.9 % bolus 1,000 mL (1,000 mLs Intravenous New Bag/Given 07/20/16 1249)     Initial Impression / Assessment and Plan / ED Course  I have reviewed the triage vital signs and the nursing notes.  Pertinent labs & imaging results that were available during my care of the  patient were reviewed by me and considered in my medical decision making (see chart for details).  MDM Reviewed: previous chart, nursing note and vitals Reviewed previous: labs, MRI and ECG Interpretation: labs, ECG, x-ray and CT scan   Results for orders placed or performed during the hospital encounter of 07/20/16  Comprehensive metabolic panel  Result Value Ref Range   Sodium 133 (L) 135 - 145 mmol/L   Potassium 3.6 3.5 - 5.1 mmol/L   Chloride 98 (L) 101 - 111 mmol/L   CO2 22 22 - 32 mmol/L   Glucose, Bld 356 (H) 65 - 99 mg/dL   BUN 13 6 - 20 mg/dL   Creatinine, Ser 1.61 0.61 - 1.24 mg/dL   Calcium 09.6 (H) 8.9 - 10.3 mg/dL   Total Protein 7.8 6.5 - 8.1 g/dL   Albumin 4.3 3.5 - 5.0 g/dL   AST 25 15 - 41 U/L   ALT 40 17 - 63 U/L     Alkaline Phosphatase 69 38 - 126 U/L   Total Bilirubin 1.6 (H) 0.3 - 1.2 mg/dL   GFR calc non Af Amer >60 >60 mL/min   GFR calc Af Amer >60 >60 mL/min   Anion gap 13 5 - 15  Lipase, blood  Result Value Ref Range   Lipase 33 11 - 51 U/L  Troponin I  Result Value Ref Range   Troponin I <0.03 <0.03 ng/mL  CBC with Differential  Result Value Ref Range   WBC 14.0 (H) 4.0 - 10.5 K/uL   RBC 6.28 (H) 4.22 - 5.81 MIL/uL   Hemoglobin 19.9 (H) 13.0 - 17.0 g/dL   HCT 04.5 (H) 40.9 - 81.1 %   MCV 86.8 78.0 - 100.0 fL   MCH 31.7 26.0 - 34.0 pg   MCHC 36.5 (H) 30.0 - 36.0 g/dL   RDW 91.4 78.2 - 95.6 %   Platelets 257 150 - 400 K/uL   Neutrophils Relative % 79 %   Neutro Abs 11.1 (H) 1.7 - 7.7 K/uL   Lymphocytes Relative 16 %   Lymphs Abs 2.3 0.7 - 4.0 K/uL   Monocytes Relative 5 %   Monocytes Absolute 0.7 0.1 - 1.0 K/uL   Eosinophils Relative 0 %   Eosinophils Absolute 0.0 0.0 - 0.7 K/uL   Basophils Relative 0 %   Basophils Absolute 0.0 0.0 - 0.1 K/uL  POC occult blood, ED  Result Value Ref Range   Fecal Occult Bld NEGATIVE NEGATIVE  CBG monitoring, ED  Result Value Ref Range   Glucose-Capillary 266 (H) 65 - 99 mg/dL   Dg Chest 2 View Result Date: 07/20/2016 CLINICAL DATA:  Back pain and hypertension. New onset vomiting and tachycardia. EXAM: CHEST  2 VIEW COMPARISON:  02/28/2015 FINDINGS: Artifact overlies the chest. Heart size is normal. Mediastinal shadows are normal. The lungs are clear. No effusions. Old rib fracture of the left posterior seventh rib. IMPRESSION: No active cardiopulmonary disease. Electronically Signed   By: Paulina Fusi M.D.   On: 07/20/2016 12:09   Mr Lumbar Spine Wo Contrast Result Date: 07/19/2016 CLINICAL DATA:  Low back pain over the last few days without known injury. EXAM: MRI LUMBAR SPINE WITHOUT CONTRAST TECHNIQUE: Multiplanar, multisequence MR imaging of the lumbar spine was performed. No intravenous contrast was administered. COMPARISON:  MRI of the  lumbar spine 03/28/2016. FINDINGS: Segmentation: 5 non rib-bearing lumbar type vertebral bodies are present. Alignment: AP alignment is anatomic. Mild leftward curvature is centered at L2. Vertebrae: A remote superior endplate  compression fracture is noted at L1. Edematous changes along the left inferior aspect of L4 associated with a Schmorl's node, similar to the prior study. Conus medullaris: Extends to the L1-2 level and appears normal. Paraspinal and other soft tissues: Limited imaging of the abdomen is unremarkable. There is no significant adenopathy. The paraspinous musculature is within normal limits. Disc levels: T12-L1:  Negative. L1-2: Mild facet hypertrophy is present without significant stenosis. L2-3: A broad-based disc protrusion is asymmetric to the right. Mild subarticular narrowing is present bilaterally. The foramina are patent. L3-4: A broad-based disc protrusion is present. Mild facet hypertrophy is noted bilaterally. Mild central and foraminal narrowing bilaterally is stable. L4-5: A broad-based disc protrusion is present. Short pedicles and facet hypertrophy contribute to moderate subarticular narrowing bilaterally. Mild foraminal narrowing bilaterally is stable. L5-S1: A mild broad-based disc protrusion is present. Facet hypertrophy and spurring is worse right than left. Mild subarticular narrowing is worse on the right. Moderate right and mild left foraminal stenosis is stable. IMPRESSION: 1. 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 2. Mild subarticular narrowing bilaterally at L2-3. 3. Mild central and bilateral foraminal stenosis at L3-4. 4. Moderate subarticular and mild foraminal narrowing bilaterally at L4-5. 5. Mild subarticular narrowing at L5-S1 is worse on the right. 6. Moderate right and mild left foraminal stenosis at L5-S1 is stable. Electronically Signed   By: Christopher  Mattern M.D.  Marin Roberts On: 07/19/2016 20:38   Ct Abdomen  Pelvis W Contrast Result Date: 07/20/2016 CLINICAL DATA:  Back pain since last night. Elevated blood pressure. EXAM: CT ABDOMEN AND PELVIS WITH CONTRAST TECHNIQUE: Multidetector CT imaging of the abdomen and pelvis was performed using the standard protocol following bolus administration of intravenous contrast. CONTRAST:  100mL ISOVUE-300 IOPAMIDOL (ISOVUE-300) INJECTION 61% COMPARISON:  None. FINDINGS: Lower chest and abdominal wall: Small fatty bilateral inguinal hernia. Mildly thickened appearance of the distal esophagus with mild surrounding fluid. No pneumomediastinum. No fluid collection. Hepatobiliary: No focal liver abnormality.No evidence of biliary obstruction or stone. Pancreas: Unremarkable. Spleen: Unremarkable. Adrenals/Urinary Tract: Negative adrenals. No hydronephrosis or stone. Unremarkable bladder. Stomach/Bowel: Extensive colonic and terminal ileal diverticulosis. No active inflammation. No bowel obstruction. Appendectomy. Reproductive:No pathologic findings. Vascular/Lymphatic: Aortic and branch vessel atherosclerosis. No acute vascular abnormality. No mass or adenopathy. Other: No ascites or pneumoperitoneum. Musculoskeletal: Degenerative changes in lumbar spine as described on MRI from yesterday. Remote L1 compression fracture. IMPRESSION: 1. Probable distal esophagitis. 2. Extensive diverticulosis without active inflammation. 3. Small fatty bilateral inguinal hernia. 4.  Aortic Atherosclerosis (ICD10-170.0) Electronically Signed   By: Marnee SpringJonathon  Watts M.D.   On: 07/20/2016 12:31    1400:  Pt has been given his usual HTN meds while in the ED.  CBG trending downward with IVF; not acidotic. Pt has tol PO well while in the ED without N/V.  No stooling while in the ED.  Abd benign, VSS. Feels better and wants to go home now. Pt with elevated glucose on multiple ED evals since 02/2015. Pt strongly encouraged to f/u with PMD regarding his BP and glucose for good continuity of care and control of  his chronic medical conditions; verb understanding.  Dx and testing d/w pt and family.  Questions answered.  Verb understanding, agreeable to d/c home with outpt f/u.      Final Clinical Impressions(s) / ED Diagnoses   Final diagnoses:  None    New Prescriptions New Prescriptions   No medications on file     Nicholos JohnsKathleen  Clarene Duke, DO 07/23/16 [phone]

## 2016-07-20 NOTE — ED Triage Notes (Signed)
Seen here last night for back pain and was told his blood pressure too high.  Back this morning with new onset of vomiting and feeling like his heart is racing.

## 2016-07-28 ENCOUNTER — Encounter (INDEPENDENT_AMBULATORY_CARE_PROVIDER_SITE_OTHER): Payer: Self-pay | Admitting: Internal Medicine

## 2016-07-29 ENCOUNTER — Inpatient Hospital Stay: Payer: Self-pay

## 2016-08-10 ENCOUNTER — Encounter (INDEPENDENT_AMBULATORY_CARE_PROVIDER_SITE_OTHER): Payer: Self-pay | Admitting: Internal Medicine

## 2016-08-10 ENCOUNTER — Ambulatory Visit (INDEPENDENT_AMBULATORY_CARE_PROVIDER_SITE_OTHER): Payer: Self-pay | Admitting: Internal Medicine

## 2016-08-12 ENCOUNTER — Encounter (INDEPENDENT_AMBULATORY_CARE_PROVIDER_SITE_OTHER): Payer: Self-pay | Admitting: Internal Medicine

## 2016-08-12 ENCOUNTER — Ambulatory Visit (INDEPENDENT_AMBULATORY_CARE_PROVIDER_SITE_OTHER): Payer: Self-pay | Admitting: Internal Medicine

## 2016-09-13 ENCOUNTER — Ambulatory Visit (INDEPENDENT_AMBULATORY_CARE_PROVIDER_SITE_OTHER): Payer: Self-pay | Admitting: Internal Medicine

## 2016-09-13 ENCOUNTER — Encounter (INDEPENDENT_AMBULATORY_CARE_PROVIDER_SITE_OTHER): Payer: Self-pay | Admitting: Internal Medicine

## 2016-09-26 ENCOUNTER — Ambulatory Visit (INDEPENDENT_AMBULATORY_CARE_PROVIDER_SITE_OTHER): Payer: Self-pay | Admitting: Internal Medicine

## 2016-09-27 ENCOUNTER — Encounter (INDEPENDENT_AMBULATORY_CARE_PROVIDER_SITE_OTHER): Payer: Self-pay | Admitting: Internal Medicine

## 2019-02-04 ENCOUNTER — Emergency Department
Admission: EM | Admit: 2019-02-04 | Discharge: 2019-02-04 | Disposition: A | Discharge status: Home / Self Care | Payer: Self-pay | Attending: Emergency Medicine | Admitting: Emergency Medicine

## 2019-02-04 ENCOUNTER — Emergency Department: Payer: Self-pay

## 2019-02-04 ENCOUNTER — Other Ambulatory Visit: Payer: Self-pay

## 2019-02-04 ENCOUNTER — Encounter: Payer: Self-pay | Admitting: Emergency Medicine

## 2019-02-04 DIAGNOSIS — R109 Unspecified abdominal pain: Secondary | ICD-10-CM

## 2019-02-04 DIAGNOSIS — F17220 Nicotine dependence, chewing tobacco, uncomplicated: Secondary | ICD-10-CM | POA: Insufficient documentation

## 2019-02-04 DIAGNOSIS — R1032 Left lower quadrant pain: Secondary | ICD-10-CM | POA: Insufficient documentation

## 2019-02-04 DIAGNOSIS — I10 Essential (primary) hypertension: Secondary | ICD-10-CM | POA: Insufficient documentation

## 2019-02-04 DIAGNOSIS — Z79899 Other long term (current) drug therapy: Secondary | ICD-10-CM | POA: Insufficient documentation

## 2019-02-04 LAB — BASIC METABOLIC PANEL
Anion gap: 13 (ref 5–15)
BUN: 9 mg/dL (ref 6–20)
CALCIUM: 9 mg/dL (ref 8.9–10.3)
CO2: 17 mmol/L — ABNORMAL LOW (ref 22–32)
CREATININE: 0.65 mg/dL (ref 0.61–1.24)
Chloride: 104 mmol/L (ref 98–111)
GFR calc Af Amer: >60 mL/min (ref >60)
Glucose, Bld: 282 mg/dL — ABNORMAL HIGH (ref 70–99)
Potassium: 3.6 mmol/L (ref 3.5–5.1)
SODIUM: 134 mmol/L — AB (ref 135–145)

## 2019-02-04 LAB — CBC
HCT: 50.8 % (ref 39.0–52.0)
Hemoglobin: 18.2 g/dL — ABNORMAL HIGH (ref 13.0–17.0)
MCH: 30.6 pg (ref 26.0–34.0)
MCHC: 35.8 g/dL (ref 30.0–36.0)
MCV: 85.5 fL (ref 80.0–100.0)
Platelets: 256 10*3/uL (ref 150–400)
RBC: 5.94 MIL/uL — ABNORMAL HIGH (ref 4.22–5.81)
RDW: 12 % (ref 11.5–15.5)
WBC: 10.2 10*3/uL (ref 4.0–10.5)
nRBC: 0 % (ref 0.0–0.2)

## 2019-02-04 LAB — URINALYSIS, COMPLETE (UACMP) WITH MICROSCOPIC
BACTERIA UA: NONE SEEN
BILIRUBIN URINE: NEGATIVE
Glucose, UA: >=500 mg/dL — AB
HGB URINE DIPSTICK: NEGATIVE
KETONES UR: 5 mg/dL — AB
Leukocytes,Ua: NEGATIVE
NITRITE: NEGATIVE
PROTEIN: NEGATIVE mg/dL
Specific Gravity, Urine: 1.017 (ref 1.005–1.030)
pH: 7 (ref 5.0–8.0)

## 2019-02-04 MED ORDER — TRAMADOL HCL 50 MG PO TABS: 50.0000 mg | ORAL_TABLET | Freq: Four times a day (QID) | ORAL | 0 refills | Status: AC | PRN

## 2019-02-04 MED ORDER — IBUPROFEN 600 MG PO TABS: 600.0000 mg | ORAL_TABLET | Freq: Four times a day (QID) | ORAL | 0 refills | Status: DC | PRN

## 2019-02-04 MED ORDER — MORPHINE SULFATE (PF) 4 MG/ML IV SOLN
4.0000 mg | Freq: Once | INTRAVENOUS | Status: AC
  Administered 2019-02-04: 4 mg via INTRAVENOUS
  Filled 2019-02-04: qty 1

## 2019-02-04 MED ORDER — ONDANSETRON HCL 4 MG/2ML IJ SOLN
4.0000 mg | Freq: Once | INTRAMUSCULAR | Status: AC
  Administered 2019-02-04: 4 mg via INTRAVENOUS
  Filled 2019-02-04: qty 2

## 2019-02-04 NOTE — ED Notes (Signed)
NAD noted at time of D/C. Pt denies questions or concerns. Pt ambulatory to the lobby at this time. Pt refused wheelchair to the lobby.  MD aware of patient's BP at time of D/C, states okay for D/C.

## 2019-02-04 NOTE — Discharge Instructions (Addendum)
It is possible that your pain is nerve pain related to your prior spinal fracture.  You should take the ibuprofen every 6 hours to decrease inflammation, the tramadol only if needed for more severe pain.  Make an appointment to follow-up with your spine specialist and your primary care doctor.  Return to the ER for new, worsening, or persistent pain, weakness or numbness, difficulty walking, or any other new or worsening symptoms that concern you.

## 2019-02-04 NOTE — ED Triage Notes (Addendum)
Pt presents to ED via POV with c/o L sided flank pain. Pt states was sent by PCP at [facility], pt's states PCP ruled out kidney stone but was unable to do x-ray. Pt states was placed on muscle relaxer but states "I can't take this shit, it puts me to sleep". Pt reports difficulty urinating. Pt reports 2 episodes of vomiting blood, states dark in nature.

## 2019-02-04 NOTE — ED Provider Notes (Signed)
[facility] Emergency Department Provider Note ____________________________________________   First MD Initiated Contact with Patient 02/04/19 1529     (approximate)  I have reviewed the triage vital signs and the nursing notes.   HISTORY  Chief Complaint Back Pain and Flank Pain    HPI Theodore Hinton is a 53 y.o. male with PMH as noted below who presents with left lower quadrant and left flank pain, gradual onset over approximately last month but worse in the last 5 days, associated with nausea, not associated with any changes in his bowel movements.  He does report some difficulty urinating and some hematuria.  Patient also reports some hemoptysis last night.  He states that he was coughing and coughed up a small amount of blood twice although this has since resolved.  He denies any actual vomiting or hematemesis.  Past Medical History:  Diagnosis Date  . Back injury   . Chronic back pain   . Diverticulosis   . Hypertension     There are no active problems to display for this patient.   History reviewed. No pertinent surgical history.  Prior to Admission medications   Medication Sig Start Date End Date Taking? Authorizing Provider  HYDROcodone-acetaminophen (NORCO/VICODIN) 5-325 MG tablet Take 1 tablet by mouth every 6 (six) hours as needed. 07/19/16   Bethann BerkshireZammit, Joseph, MD  ibuprofen (ADVIL,MOTRIN) 600 MG tablet Take 1 tablet (600 mg total) by mouth every 6 (six) hours as needed. 02/04/19   Dionne BucySiadecki, Chemere Steffler, MD  lisinopril (PRINIVIL,ZESTRIL) 20 MG tablet Take 1 tablet (20 mg total) by mouth daily. 07/19/16   Bethann BerkshireZammit, Joseph, MD  meloxicam (MOBIC) 15 MG tablet Take 1 tablet (15 mg total) by mouth daily. Patient not taking: Reported on 07/20/2016 02/08/16   Cuthriell, Delorise RoyalsJonathan D, PA-C  methocarbamol (ROBAXIN) 500 MG tablet Take 1 tablet (500 mg total) by mouth 4 (four) times daily. Patient not taking: Reported on 07/20/2016 02/08/16   Cuthriell, Delorise RoyalsJonathan D,  PA-C  ondansetron (ZOFRAN ODT) 4 MG disintegrating tablet 4mg  ODT q4 hours prn nausea/vomit Patient not taking: Reported on 07/20/2016 07/19/16   Bethann BerkshireZammit, Joseph, MD  ondansetron (ZOFRAN) 4 MG tablet Take 1 tablet (4 mg total) by mouth every 8 (eight) hours as needed for nausea or vomiting. 07/20/16   Samuel JesterMcManus, Kathleen, DO  oxyCODONE-acetaminophen (PERCOCET/ROXICET) 5-325 MG tablet Take 1 tablet by mouth every 6 (six) hours.    [provider]  predniSONE (DELTASONE) 10 MG tablet Take 2 tablets (20 mg total) by mouth daily. 07/19/16   Bethann BerkshireZammit, Joseph, MD  traMADol (ULTRAM) 50 MG tablet Take 1 tablet (50 mg total) by mouth every 6 (six) hours as needed. 02/04/19 02/04/20  Dionne BucySiadecki, Teanna Elem, MD    Allergies Patient has no known allergies.  History reviewed. No pertinent family history.  Social History Social History   Tobacco Use  . Smoking status: Never Smoker  . Smokeless tobacco: Current User    Types: Chew  Substance Use Topics  . Alcohol use: Yes    Comment: occasionally  . Drug use: No    Review of Systems  Constitutional: No fever. Eyes: No redness. ENT: No sore throat. Cardiovascular: Denies chest pain. Respiratory: Denies shortness of breath. Gastrointestinal: Positive for nausea. Genitourinary: Positive for hematuria. Musculoskeletal: Positive for back pain. Skin: Negative for rash. Neurological: Negative for focal weakness or numbness.   ____________________________________________   PHYSICAL EXAM:  VITAL SIGNS: ED Triage Vitals  Enc Vitals Group     BP --  Pulse Rate 02/04/19 1358 83     Resp 02/04/19 1358 20     Temp 02/04/19 1358 99 F (37.2 C)     Temp Source 02/04/19 1358 Oral     SpO2 02/04/19 1358 100 %     Weight 02/04/19 1359 196 lb (88.9 kg)     Height 02/04/19 1359 5\' 8"  (1.727 m)     Head Circumference --      Peak Flow --      Pain Score 02/04/19 1358 10     Pain Loc --      Pain Edu? --      Excl. in GC? --      Constitutional: Alert and oriented.  Uncomfortable appearing but in no acute distress. Eyes: Conjunctivae are normal.  Head: Atraumatic. Nose: No congestion/rhinnorhea. Mouth/Throat: Mucous membranes are moist.   Neck: Normal range of motion.  Cardiovascular: Normal rate, regular rhythm. Grossly normal heart sounds.  Good peripheral circulation. Respiratory: Normal respiratory effort.  No retractions. Lungs CTAB. Gastrointestinal: Soft with mild left lower quadrant tenderness. No distention.  Genitourinary: No CVA tenderness.  Mild to moderate right flank tenderness. Musculoskeletal: Extremities warm and well perfused.  Neurologic:  Normal speech and language. No gross focal neurologic deficits are appreciated.  5/5 motor strength and intact sensation of bilateral lower extremities. Skin:  Skin is warm and dry. No rash noted. Psychiatric: Mood and affect are normal. Speech and behavior are normal.  ____________________________________________   LABS (all labs ordered are listed, but only abnormal results are displayed)  Labs Reviewed  URINALYSIS, COMPLETE (UACMP) WITH MICROSCOPIC - Abnormal; Notable for the following components:      Result Value   Color, Urine YELLOW (*)    APPearance CLEAR (*)    Glucose, UA >=500 (*)    Ketones, ur 5 (*)    All other components within normal limits  BASIC METABOLIC PANEL - Abnormal; Notable for the following components:   Sodium 134 (*)    CO2 17 (*)    Glucose, Bld 282 (*)    All other components within normal limits  CBC - Abnormal; Notable for the following components:   RBC 5.94 (*)    Hemoglobin 18.2 (*)    All other components within normal limits   ____________________________________________  EKG  ____________________________________________  RADIOLOGY  CT abdomen: No acute abnormality.  Chronic L1 compression fracture. CXR: No focal infiltrate or other acute  abnormality  ____________________________________________   PROCEDURES  Procedure(s) performed: No  Procedures  Critical Care performed: No ____________________________________________   INITIAL IMPRESSION / ASSESSMENT AND PLAN / ED COURSE  Pertinent labs & imaging results that were available during my care of the patient were reviewed by me and considered in my medical decision making (see chart for details).  53 year old male with PMH as noted above presents with left flank pain rating to the left lower abdomen over the last month, but worse in the last 5 days.  It is not associated with trauma.  He has had some urinary symptoms.  He also reports cough with a small amount of hemoptysis yesterday which has resolved.  On exam he is overall relatively well-appearing.  His vital signs are normal.  The remainder of the exam is as described above; he has some mild left lower quadrant and moderate left flank tenderness with no other significant findings.  Differential includes ureteral stone, pyelonephritis, diverticulitis, colitis, musculoskeletal pain, or radiculopathy.  We will obtain labs, UA, CT abdomen and reassess.  The small amount of hemoptysis is of unclear significance.  He has no active respiratory symptoms at this time and from additional history it is clear that he is not describing hematemesis.  We will obtain a chest x-ray to rule out acute pulmonary etiology although suspect most likely bronchitis.  ----------------------------------------- 4:55 PM on 02/04/2019 -----------------------------------------  Lab work-up, chest x-ray, and CT are all within normal limits except for elevated glucose.  Overall I suspect most likely musculoskeletal etiology or radiculopathy, especially as the patient has a chronic L1 fracture which could be related to his acute pain.  He has no neurologic deficits and is stable for discharge home.  He is more comfortable now.  I counseled the  patient on the results of the work-up.  I recommend that he follow-up with his spine specialist and primary care doctor and he agrees.  Return precautions given, and he expresses understanding. ____________________________________________   FINAL CLINICAL IMPRESSION(S) / ED DIAGNOSES  Final diagnoses:  Left flank pain      NEW MEDICATIONS STARTED DURING THIS VISIT:  New Prescriptions   IBUPROFEN (ADVIL,MOTRIN) 600 MG TABLET    Take 1 tablet (600 mg total) by mouth every 6 (six) hours as needed.   TRAMADOL (ULTRAM) 50 MG TABLET    Take 1 tablet (50 mg total) by mouth every 6 (six) hours as needed.     Note:  This document was prepared using Dragon voice recognition software and may include unintentional dictation errors.    Dionne Bucy, MD 02/04/19 [phone]

## 2020-09-21 ENCOUNTER — Encounter (HOSPITAL_BASED_OUTPATIENT_CLINIC_OR_DEPARTMENT_OTHER): Payer: Self-pay | Admitting: *Deleted

## 2020-09-21 ENCOUNTER — Emergency Department (HOSPITAL_BASED_OUTPATIENT_CLINIC_OR_DEPARTMENT_OTHER)
Admission: EM | Admit: 2020-09-21 | Discharge: 2020-09-21 | Disposition: A | Discharge status: Home / Self Care | Payer: Medicaid Other | Attending: Emergency Medicine | Admitting: Emergency Medicine

## 2020-09-21 ENCOUNTER — Other Ambulatory Visit: Payer: Self-pay

## 2020-09-21 ENCOUNTER — Emergency Department (HOSPITAL_BASED_OUTPATIENT_CLINIC_OR_DEPARTMENT_OTHER): Payer: Medicaid Other

## 2020-09-21 DIAGNOSIS — Z79899 Other long term (current) drug therapy: Secondary | ICD-10-CM | POA: Insufficient documentation

## 2020-09-21 DIAGNOSIS — I10 Essential (primary) hypertension: Secondary | ICD-10-CM | POA: Insufficient documentation

## 2020-09-21 DIAGNOSIS — R079 Chest pain, unspecified: Secondary | ICD-10-CM | POA: Insufficient documentation

## 2020-09-21 LAB — CBC WITH DIFFERENTIAL/PLATELET
Abs Immature Granulocytes: 0.06 10*3/uL (ref 0.00–0.07)
Basophils Absolute: 0.1 10*3/uL (ref 0.0–0.1)
Basophils Relative: 1 %
Eosinophils Absolute: 0.4 10*3/uL (ref 0.0–0.5)
Eosinophils Relative: 4 %
HCT: 51.2 % (ref 39.0–52.0)
Hemoglobin: 18.2 g/dL — ABNORMAL HIGH (ref 13.0–17.0)
Immature Granulocytes: 1 %
Lymphocytes Relative: 24 %
Lymphs Abs: 2.4 10*3/uL (ref 0.7–4.0)
MCH: 31.3 pg (ref 26.0–34.0)
MCHC: 35.5 g/dL (ref 30.0–36.0)
MCV: 88 fL (ref 80.0–100.0)
Monocytes Absolute: 0.5 10*3/uL (ref 0.1–1.0)
Monocytes Relative: 5 %
Neutro Abs: 6.7 10*3/uL (ref 1.7–7.7)
Neutrophils Relative %: 65 %
Platelets: 240 10*3/uL (ref 150–400)
RBC: 5.82 MIL/uL — ABNORMAL HIGH (ref 4.22–5.81)
RDW: 11.9 % (ref 11.5–15.5)
WBC: 10.1 10*3/uL (ref 4.0–10.5)
nRBC: 0 % (ref 0.0–0.2)

## 2020-09-21 LAB — BASIC METABOLIC PANEL
Anion gap: 11 (ref 5–15)
BUN: 7 mg/dL (ref 6–20)
CO2: 26 mmol/L (ref 22–32)
Calcium: 9.3 mg/dL (ref 8.9–10.3)
Chloride: 99 mmol/L (ref 98–111)
Creatinine, Ser: 0.77 mg/dL (ref 0.61–1.24)
GFR, Estimated: >60 mL/min (ref >60)
Glucose, Bld: 226 mg/dL — ABNORMAL HIGH (ref 70–99)
Potassium: 3.8 mmol/L (ref 3.5–5.1)
Sodium: 136 mmol/L (ref 135–145)

## 2020-09-21 LAB — TROPONIN I (HIGH SENSITIVITY): Troponin I (High Sensitivity): 4 ng/L (ref <18)

## 2020-09-21 MED ORDER — OXYCODONE-ACETAMINOPHEN 5-325 MG PO TABS
1.0000 | ORAL_TABLET | Freq: Once | ORAL | Status: AC
  Administered 2020-09-21: 1 via ORAL
  Filled 2020-09-21: qty 1

## 2020-09-21 NOTE — ED Triage Notes (Signed)
Chest pain for a month. Burning to the right side of his chest. He was seen at [facility] for same and told he might have shingles. No rash ever appeared. He started having SOB and tightness across his chest 2 days ago. If his shirt touches his right chest it makes his skin burn.

## 2020-09-21 NOTE — Discharge Instructions (Addendum)
Please follow-up with your primary doctor regarding your symptoms you experience today.  Return to ER if you develop worsening chest pain, difficulty in breathing or other new concerning symptom.

## 2020-09-22 NOTE — ED Provider Notes (Signed)
[facility] EMERGENCY DEPARTMENT Provider Note   CSN: [id number] Arrival date & time: 09/21/20  1659     History Chief Complaint  Patient presents with  . Chest Pain    Theodore Hinton is a 54 y.o. male. Presents to ER with concern for chest pain. Patient reports that he has been having chest pains over the past couple months, burning sensation across his right side of his chest, reports that he was at [facility] and told him he may have shingles. Reports that symptoms are not improving but also not worsening. Not associated with exertion, no alleviating factors. Has not taken medication for this today. No associated shortness of breath. Denies knowing of any rash. Pain is worse with touching his skin. Denies smoking history, denies family history of CAD, has personal history of hypertension. HPI     Past Medical History:  Diagnosis Date  . Back injury   . Chronic back pain   . Diverticulosis   . Hypertension     There are no problems to display for this patient.   History reviewed. No pertinent surgical history.     No family history on file.  Social History   Tobacco Use  . Smoking status: Never Smoker  . Smokeless tobacco: Current User    Types: Chew  Substance Use Topics  . Alcohol use: Yes    Comment: occasionally  . Drug use: No    Home Medications Prior to Admission medications   Medication Sig Start Date End Date Taking? Authorizing Provider  gabapentin (NEURONTIN) 300 MG capsule Take by mouth. 02/21/17  Yes [provider]  HYDROcodone-acetaminophen (NORCO/VICODIN) 5-325 MG tablet Take 1 tablet by mouth every 6 (six) hours as needed. 07/19/16   Bethann Berkshire, MD  ibuprofen (ADVIL,MOTRIN) 600 MG tablet Take 1 tablet (600 mg total) by mouth every 6 (six) hours as needed. 02/04/19   Dionne Bucy, MD  lisinopril (PRINIVIL,ZESTRIL) 20 MG tablet Take 1 tablet (20 mg total) by mouth daily. 07/19/16   Bethann Berkshire, MD  meloxicam (MOBIC) 15  MG tablet Take 1 tablet (15 mg total) by mouth daily. Patient not taking: Reported on 07/20/2016 02/08/16   Cuthriell, Delorise Royals, PA-C  methocarbamol (ROBAXIN) 500 MG tablet Take 1 tablet (500 mg total) by mouth 4 (four) times daily. Patient not taking: Reported on 07/20/2016 02/08/16   Cuthriell, Delorise Royals, PA-C  ondansetron (ZOFRAN ODT) 4 MG disintegrating tablet 4mg  ODT q4 hours prn nausea/vomit Patient not taking: Reported on 07/20/2016 07/19/16   07/21/16, MD  ondansetron (ZOFRAN) 4 MG tablet Take 1 tablet (4 mg total) by mouth every 8 (eight) hours as needed for nausea or vomiting. 07/20/16   07/22/16, DO  oxyCODONE-acetaminophen (PERCOCET/ROXICET) 5-325 MG tablet Take 1 tablet by mouth every 6 (six) hours.    [provider]  predniSONE (DELTASONE) 10 MG tablet Take 2 tablets (20 mg total) by mouth daily. 07/19/16   07/21/16, MD    Allergies    Patient has no known allergies.  Review of Systems   Review of Systems  Constitutional: Negative for chills and fever.  HENT: Negative for ear pain and sore throat.   Eyes: Negative for pain and visual disturbance.  Respiratory: Negative for cough and shortness of breath.   Cardiovascular: Positive for chest pain. Negative for palpitations.  Gastrointestinal: Negative for abdominal pain and vomiting.  Genitourinary: Negative for dysuria and hematuria.  Musculoskeletal: Negative for arthralgias and back pain.  Skin: Negative for color change  and rash.  Neurological: Negative for seizures and syncope.  All other systems reviewed and are negative.   Physical Exam Updated Vital Signs BP (!) 185/106 (BP Location: Right Arm)   Pulse 72   Temp 98 F (36.7 C) (Oral)   Resp 17   Ht 5\' 8"  (1.727 m)   Wt 81.6 kg   SpO2 98%   BMI 27.37 kg/m   Physical Exam Vitals and nursing note reviewed.  Constitutional:      Appearance: He is well-developed.  HENT:     Head: Normocephalic and atraumatic.  Eyes:      Conjunctiva/sclera: Conjunctivae normal.  Cardiovascular:     Rate and Rhythm: Normal rate and regular rhythm.     Heart sounds: No murmur heard.   Pulmonary:     Effort: Pulmonary effort is normal. No respiratory distress.     Breath sounds: Normal breath sounds.  Abdominal:     Palpations: Abdomen is soft.     Tenderness: There is no abdominal tenderness.  Musculoskeletal:     Cervical back: Neck supple.  Skin:    General: Skin is warm and dry.     Capillary Refill: Capillary refill takes less than 2 seconds.     Comments: No skin lesions appreciated on careful inspection of trunk  Neurological:     General: No focal deficit present.     Mental Status: He is alert and oriented to person, place, and time.     ED Results / Procedures / Treatments   Labs (all labs ordered are listed, but only abnormal results are displayed) Labs Reviewed  CBC WITH DIFFERENTIAL/PLATELET - Abnormal; Notable for the following components:      Result Value   RBC 5.82 (*)    Hemoglobin 18.2 (*)    All other components within normal limits  BASIC METABOLIC PANEL - Abnormal; Notable for the following components:   Glucose, Bld 226 (*)    All other components within normal limits  TROPONIN I (HIGH SENSITIVITY)    EKG EKG Interpretation  Date/Time:  Monday September 21 2020 17:01:02 EDT Ventricular Rate:  93 PR Interval:  176 QRS Duration: 78 QT Interval:  358 QTC Calculation: 445 R Axis:   7 Text Interpretation: Normal sinus rhythm Inferior infarct , age undetermined Abnormal ECG Confirmed by 10-31-1998 (Marianna Fuss) on 09/21/2020 8:32:20 PM Also confirmed by 09/23/2020 (Marianna Fuss), editor [id number] [phone])  on 09/22/2020 2:46:48 PM   Radiology DG Chest 2 View  Result Date: 09/21/2020 CLINICAL DATA:  Right-sided chest pain. EXAM: CHEST - 2 VIEW COMPARISON:  June 26, 2011 FINDINGS: The heart size and mediastinal contours are within normal limits. Both lungs are clear. The  visualized skeletal structures are unremarkable. IMPRESSION: No active cardiopulmonary disease. Electronically Signed   By: June 28, 2011 M.D.   On: 09/21/2020 20:46    Procedures Procedures (including critical care time)  Medications Ordered in ED Medications  oxyCODONE-acetaminophen (PERCOCET/ROXICET) 5-325 MG per tablet 1 tablet (1 tablet Oral Given 09/21/20 2046)    ED Course  I have reviewed the triage vital signs and the nursing notes.  Pertinent labs & imaging results that were available during my care of the patient were reviewed by me and considered in my medical decision making (see chart for details).    MDM Rules/Calculators/A&P                         54 year old male who presented to ER with  concern for right-sided lower chest pain. Pain occurring for months, reports diagnosed with shingles. Based on his description of pain, postherpetic neuralgia is high on differential. Do not appreciate any lesions today. He has no acute ischemic changes on EKG, troponins normal limits, doubt ACS. His CXR is clear per my review and per radiology. Believe he can be discharged and managed in the outpatient setting, recommended follow-up with his primary doctor.  After the discussed management above, the patient was determined to be safe for discharge.  The patient was in agreement with this plan and all questions regarding their care were answered.  ED return precautions were discussed and the patient will return to the ED with any significant worsening of condition.    Final Clinical Impression(s) / ED Diagnoses Final diagnoses:  Chest pain, unspecified type    Rx / DC Orders ED Discharge Orders    None       Milagros Loll, MD 09/22/20 [phone]

## 2020-12-01 ENCOUNTER — Other Ambulatory Visit: Payer: Self-pay

## 2020-12-01 ENCOUNTER — Emergency Department (HOSPITAL_BASED_OUTPATIENT_CLINIC_OR_DEPARTMENT_OTHER)
Admission: EM | Admit: 2020-12-01 | Discharge: 2020-12-01 | Disposition: A | Discharge status: Home / Self Care | Payer: Medicaid Other | Attending: Emergency Medicine | Admitting: Emergency Medicine

## 2020-12-01 ENCOUNTER — Emergency Department (HOSPITAL_BASED_OUTPATIENT_CLINIC_OR_DEPARTMENT_OTHER): Payer: Self-pay

## 2020-12-01 ENCOUNTER — Encounter (HOSPITAL_BASED_OUTPATIENT_CLINIC_OR_DEPARTMENT_OTHER): Payer: Self-pay | Admitting: *Deleted

## 2020-12-01 DIAGNOSIS — R112 Nausea with vomiting, unspecified: Secondary | ICD-10-CM | POA: Insufficient documentation

## 2020-12-01 DIAGNOSIS — R079 Chest pain, unspecified: Secondary | ICD-10-CM | POA: Insufficient documentation

## 2020-12-01 DIAGNOSIS — I10 Essential (primary) hypertension: Secondary | ICD-10-CM | POA: Insufficient documentation

## 2020-12-01 DIAGNOSIS — Z79899 Other long term (current) drug therapy: Secondary | ICD-10-CM | POA: Insufficient documentation

## 2020-12-01 DIAGNOSIS — R0789 Other chest pain: Secondary | ICD-10-CM

## 2020-12-01 DIAGNOSIS — F17290 Nicotine dependence, other tobacco product, uncomplicated: Secondary | ICD-10-CM | POA: Insufficient documentation

## 2020-12-01 DIAGNOSIS — M545 Low back pain, unspecified: Secondary | ICD-10-CM | POA: Insufficient documentation

## 2020-12-01 DIAGNOSIS — R739 Hyperglycemia, unspecified: Secondary | ICD-10-CM

## 2020-12-01 LAB — COMPREHENSIVE METABOLIC PANEL
ALT: 27 U/L (ref 0–44)
AST: 27 U/L (ref 15–41)
Albumin: 3.9 g/dL (ref 3.5–5.0)
Alkaline Phosphatase: 70 U/L (ref 38–126)
Anion gap: 12 (ref 5–15)
BUN: 9 mg/dL (ref 6–20)
CO2: 21 mmol/L — ABNORMAL LOW (ref 22–32)
Calcium: 9 mg/dL (ref 8.9–10.3)
Chloride: 99 mmol/L (ref 98–111)
Creatinine, Ser: 0.67 mg/dL (ref 0.61–1.24)
GFR, Estimated: >60 mL/min (ref >60)
Glucose, Bld: 313 mg/dL — ABNORMAL HIGH (ref 70–99)
Potassium: 3.7 mmol/L (ref 3.5–5.1)
Sodium: 132 mmol/L — ABNORMAL LOW (ref 135–145)
Total Bilirubin: 1.3 mg/dL — ABNORMAL HIGH (ref 0.3–1.2)
Total Protein: 7.3 g/dL (ref 6.5–8.1)

## 2020-12-01 LAB — CBC WITH DIFFERENTIAL/PLATELET
Abs Immature Granulocytes: 0.07 10*3/uL (ref 0.00–0.07)
Basophils Absolute: 0.1 10*3/uL (ref 0.0–0.1)
Basophils Relative: 1 %
Eosinophils Absolute: 0.5 10*3/uL (ref 0.0–0.5)
Eosinophils Relative: 4 %
HCT: 51 % (ref 39.0–52.0)
Hemoglobin: 18.4 g/dL — ABNORMAL HIGH (ref 13.0–17.0)
Immature Granulocytes: 1 %
Lymphocytes Relative: 21 %
Lymphs Abs: 2.7 10*3/uL (ref 0.7–4.0)
MCH: 31.6 pg (ref 26.0–34.0)
MCHC: 36.1 g/dL — ABNORMAL HIGH (ref 30.0–36.0)
MCV: 87.6 fL (ref 80.0–100.0)
Monocytes Absolute: 0.6 10*3/uL (ref 0.1–1.0)
Monocytes Relative: 5 %
Neutro Abs: 8.5 10*3/uL — ABNORMAL HIGH (ref 1.7–7.7)
Neutrophils Relative %: 68 %
Platelets: 249 10*3/uL (ref 150–400)
RBC: 5.82 MIL/uL — ABNORMAL HIGH (ref 4.22–5.81)
RDW: 12.2 % (ref 11.5–15.5)
WBC: 12.5 10*3/uL — ABNORMAL HIGH (ref 4.0–10.5)
nRBC: 0 % (ref 0.0–0.2)

## 2020-12-01 LAB — TROPONIN I (HIGH SENSITIVITY): Troponin I (High Sensitivity): 4 ng/L (ref <18)

## 2020-12-01 MED ORDER — KETOROLAC TROMETHAMINE 60 MG/2ML IM SOLN
60.0000 mg | Freq: Once | INTRAMUSCULAR | Status: AC
  Administered 2020-12-01: 60 mg via INTRAMUSCULAR
  Filled 2020-12-01: qty 2

## 2020-12-01 MED ORDER — ONDANSETRON 4 MG PO TBDP: ORAL_TABLET | ORAL | 0 refills | Status: DC

## 2020-12-01 MED ORDER — PANTOPRAZOLE SODIUM 40 MG PO TBEC
40.0000 mg | DELAYED_RELEASE_TABLET | Freq: Once | ORAL | Status: AC
  Administered 2020-12-01: 40 mg via ORAL
  Filled 2020-12-01: qty 1

## 2020-12-01 MED ORDER — ONDANSETRON 4 MG PO TBDP
4.0000 mg | ORAL_TABLET | Freq: Once | ORAL | Status: AC
  Administered 2020-12-01: 4 mg via ORAL
  Filled 2020-12-01: qty 1

## 2020-12-01 MED ORDER — MORPHINE SULFATE (PF) 4 MG/ML IV SOLN
4.0000 mg | Freq: Once | INTRAVENOUS | Status: AC
  Administered 2020-12-01: 4 mg via INTRAMUSCULAR
  Filled 2020-12-01: qty 1

## 2020-12-01 MED ORDER — METFORMIN HCL 500 MG PO TABS: 500.0000 mg | ORAL_TABLET | Freq: Two times a day (BID) | ORAL | 0 refills | Status: DC

## 2020-12-01 MED ORDER — ESOMEPRAZOLE MAGNESIUM 40 MG PO CPDR: 40.0000 mg | DELAYED_RELEASE_CAPSULE | Freq: Every day | ORAL | 0 refills | Status: DC

## 2020-12-01 NOTE — ED Provider Notes (Signed)
[facility] EMERGENCY DEPARTMENT Provider Note   CSN: [id number] Arrival date & time: 12/01/20  1113     History Chief Complaint  Patient presents with  . Chest Pain    Ishaaq Penna is a 54 y.o. male here with chest pain. Patient has been having left sided chest pain and back pain for the last 3 weeks.  States that the pain is like a burning sensation.  Patient had this pain before and it made his troponins and evaluate cardiac problems.  Of note he states that been going on for a year or two after he was taken off pain medicine by his pain doctor.  Patient was seen previously for this as well.  Patient was referred to GI but never had an endoscopy.  Patient had some nausea vomiting and burning sensation patient is PPI for now.  The history is provided by the patient.       Past Medical History:  Diagnosis Date  . Back injury   . Chronic back pain   . Diverticulosis   . Hypertension     There are no problems to display for this patient.   History reviewed. No pertinent surgical history.     No family history on file.  Social History   Tobacco Use  . Smoking status: Never Smoker  . Smokeless tobacco: Current User    Types: Chew  Substance Use Topics  . Alcohol use: Yes    Comment: occasionally  . Drug use: No    Home Medications Prior to Admission medications   Medication Sig Start Date End Date Taking? Authorizing Provider  gabapentin (NEURONTIN) 300 MG capsule Take by mouth. 02/21/17   [provider]  HYDROcodone-acetaminophen (NORCO/VICODIN) 5-325 MG tablet Take 1 tablet by mouth every 6 (six) hours as needed. 07/19/16   Bethann Berkshire, MD  ibuprofen (ADVIL,MOTRIN) 600 MG tablet Take 1 tablet (600 mg total) by mouth every 6 (six) hours as needed. 02/04/19   Dionne Bucy, MD  lisinopril (PRINIVIL,ZESTRIL) 20 MG tablet Take 1 tablet (20 mg total) by mouth daily. 07/19/16   Bethann Berkshire, MD  meloxicam (MOBIC) 15 MG tablet Take 1  tablet (15 mg total) by mouth daily. Patient not taking: Reported on 07/20/2016 02/08/16   Cuthriell, Delorise Royals, PA-C  methocarbamol (ROBAXIN) 500 MG tablet Take 1 tablet (500 mg total) by mouth 4 (four) times daily. Patient not taking: Reported on 07/20/2016 02/08/16   Cuthriell, Delorise Royals, PA-C  ondansetron (ZOFRAN ODT) 4 MG disintegrating tablet 4mg  ODT q4 hours prn nausea/vomit Patient not taking: Reported on 07/20/2016 07/19/16   07/21/16, MD  ondansetron (ZOFRAN) 4 MG tablet Take 1 tablet (4 mg total) by mouth every 8 (eight) hours as needed for nausea or vomiting. 07/20/16   07/22/16, DO  oxyCODONE-acetaminophen (PERCOCET/ROXICET) 5-325 MG tablet Take 1 tablet by mouth every 6 (six) hours.    [provider]  predniSONE (DELTASONE) 10 MG tablet Take 2 tablets (20 mg total) by mouth daily. 07/19/16   07/21/16, MD    Allergies    Patient has no known allergies.  Review of Systems   Review of Systems  Cardiovascular: Positive for chest pain.  All other systems reviewed and are negative.   Physical Exam Updated Vital Signs BP (!) 160/106 (BP Location: Right Arm)   Pulse 85   Temp 98 F (36.7 C)   Resp 18   Ht 5\' 8"  (1.727 m)   Wt 65.8 kg   SpO2  96%   BMI 22.05 kg/m   Physical Exam Vitals and nursing note reviewed.  Constitutional:      Appearance: He is well-developed.  HENT:     Head: Normocephalic.  Eyes:     Extraocular Movements: Extraocular movements intact.     Pupils: Pupils are equal, round, and reactive to light.  Cardiovascular:     Rate and Rhythm: Normal rate and regular rhythm.     Heart sounds: Normal heart sounds.  Pulmonary:     Effort: Pulmonary effort is normal.     Breath sounds: Normal breath sounds.  Chest:     Comments: Mild L scapula tenderness, no deformity  Abdominal:     General: Bowel sounds are normal.     Palpations: Abdomen is soft.  Musculoskeletal:        General: Normal range of motion.     Cervical back:  Normal range of motion and neck supple.  Skin:    General: Skin is warm.     Capillary Refill: Capillary refill takes less than 2 seconds.  Neurological:     General: No focal deficit present.     Mental Status: He is alert and oriented to person, place, and time.  Psychiatric:        Mood and Affect: Mood normal.        Behavior: Behavior normal.     ED Results / Procedures / Treatments   Labs (all labs ordered are listed, but only abnormal results are displayed) Labs Reviewed  CBC WITH DIFFERENTIAL/PLATELET - Abnormal; Notable for the following components:      Result Value   WBC 12.5 (*)    RBC 5.82 (*)    Hemoglobin 18.4 (*)    MCHC 36.1 (*)    Neutro Abs 8.5 (*)    All other components within normal limits  COMPREHENSIVE METABOLIC PANEL - Abnormal; Notable for the following components:   Sodium 132 (*)    CO2 21 (*)    Glucose, Bld 313 (*)    Total Bilirubin 1.3 (*)    All other components within normal limits  TROPONIN I (HIGH SENSITIVITY)    EKG EKG Interpretation  Date/Time:  Tuesday December 01 2020 11:23:52 EST Ventricular Rate:  92 PR Interval:  172 QRS Duration: 84 QT Interval:  384 QTC Calculation: 474 R Axis:   -39 Text Interpretation: Normal sinus rhythm Left axis deviation Inferior infarct , age undetermined Cannot rule out Anterior infarct , age undetermined Abnormal ECG NSR, t wave changes in v43-3 old Confirmed by Coralee Pesa [id number]) on 12/01/2020 11:33:49 AM   Radiology DG Chest 2 View  Result Date: 12/01/2020 CLINICAL DATA:  Left scapular pain radiating into chest. EXAM: CHEST - 2 VIEW COMPARISON:  09/21/2020. FINDINGS: Mediastinum and hilar structures normal. Low lung volumes with mild right base atelectasis. No pleural effusion or pneumothorax. Old left seventh rib fracture noted. No acute bony abnormality identified. IMPRESSION: 1.  Low lung volumes with mild right base atelectasis. 2. Old left seventh rib fracture. No acute bony  abnormality. No pneumothorax. Electronically Signed   By: Maisie Fus  Register   On: 12/01/2020 12:12    Procedures Procedures (including critical care time)  Medications Ordered in ED Medications  ketorolac (TORADOL) injection 60 mg (has no administration in time range)  morphine 4 MG/ML injection 4 mg (has no administration in time range)  pantoprazole (PROTONIX) EC tablet 40 mg (has no administration in time range)  ondansetron (ZOFRAN-ODT) disintegrating tablet 4 mg (has  no administration in time range)    ED Course  I have reviewed the triage vital signs and the nursing notes.  Pertinent labs & imaging results that were available during my care of the patient were reviewed by me and considered in my medical decision making (see chart for details).    MDM Rules/Calculators/A&P                         Binnie Vonderhaar is a 54 y.o. male here with chest pain for 3 weeks. Burning sensation. Likely reflux. Also mild reproducible L scapula tenderness. I doubt ACS and trop neg x 1 sufficient. CXR and labs unremarkable. Of note, patient's glucose is 300. Even though its not a fasting glucose, suspect that he may be developing diabetes. Will start on metformin, will give nexium and refer to GI outpatient   Final Clinical Impression(s) / ED Diagnoses Final diagnoses:  None    Rx / DC Orders ED Discharge Orders    None       Charlynne Pander, MD 12/01/20 1521

## 2020-12-01 NOTE — ED Triage Notes (Signed)
C/o left upper back pain which radiates to left chest x 3 weeks. HX chronic chest pain

## 2020-12-01 NOTE — Discharge Instructions (Signed)
Your blood sugar is elevated. Take metformin 500 mg twice daily. You need to see your primary care doctor and get evaluation for diabetes   You may have reflux. Take nexium daily.   Take zofran for nausea   Stay hydrated   Your heart enzyme is normal today   See GI for endoscopy   Return to ER if you have worse chest pain, trouble breathing, fever, vomiting

## 2021-03-07 ENCOUNTER — Encounter (HOSPITAL_BASED_OUTPATIENT_CLINIC_OR_DEPARTMENT_OTHER): Payer: Self-pay | Admitting: *Deleted

## 2021-03-07 ENCOUNTER — Emergency Department (HOSPITAL_BASED_OUTPATIENT_CLINIC_OR_DEPARTMENT_OTHER)
Admission: EM | Admit: 2021-03-07 | Discharge: 2021-03-07 | Disposition: A | Discharge status: Home / Self Care | Payer: Medicaid Other | Attending: Emergency Medicine | Admitting: Emergency Medicine

## 2021-03-07 ENCOUNTER — Other Ambulatory Visit: Payer: Self-pay

## 2021-03-07 ENCOUNTER — Emergency Department (HOSPITAL_BASED_OUTPATIENT_CLINIC_OR_DEPARTMENT_OTHER): Payer: Medicaid Other

## 2021-03-07 DIAGNOSIS — K6389 Other specified diseases of intestine: Secondary | ICD-10-CM | POA: Insufficient documentation

## 2021-03-07 DIAGNOSIS — I10 Essential (primary) hypertension: Secondary | ICD-10-CM | POA: Insufficient documentation

## 2021-03-07 DIAGNOSIS — K639 Disease of intestine, unspecified: Secondary | ICD-10-CM

## 2021-03-07 DIAGNOSIS — R109 Unspecified abdominal pain: Secondary | ICD-10-CM

## 2021-03-07 DIAGNOSIS — E119 Type 2 diabetes mellitus without complications: Secondary | ICD-10-CM | POA: Insufficient documentation

## 2021-03-07 DIAGNOSIS — M5432 Sciatica, left side: Secondary | ICD-10-CM

## 2021-03-07 DIAGNOSIS — M5442 Lumbago with sciatica, left side: Secondary | ICD-10-CM | POA: Insufficient documentation

## 2021-03-07 HISTORY — DX: Type 2 diabetes mellitus without complications: E11.9

## 2021-03-07 MED ORDER — METHOCARBAMOL 500 MG PO TABS: 500.0000 mg | ORAL_TABLET | Freq: Three times a day (TID) | ORAL | 0 refills | Status: DC | PRN

## 2021-03-07 MED ORDER — IBUPROFEN 800 MG PO TABS: 800.0000 mg | ORAL_TABLET | Freq: Three times a day (TID) | ORAL | 0 refills | Status: DC

## 2021-03-07 MED ORDER — KETOROLAC TROMETHAMINE 30 MG/ML IJ SOLN
30.0000 mg | Freq: Once | INTRAMUSCULAR | Status: AC
  Administered 2021-03-07: 30 mg via INTRAMUSCULAR
  Filled 2021-03-07: qty 1

## 2021-03-07 MED ORDER — DICLOFENAC SODIUM 1 % EX GEL: 2.0000 g | Freq: Four times a day (QID) | CUTANEOUS | 0 refills | Status: DC | PRN

## 2021-03-07 NOTE — ED Triage Notes (Addendum)
Left lower back pain x 1 week. Reports increased pain with movement. Ambulatory. Denies known recent injury.  Also reports vomiting x 1 day

## 2021-03-07 NOTE — ED Provider Notes (Signed)
Emergency Department Provider Note   I have reviewed the triage vital signs and the nursing notes.   HISTORY  Chief Complaint Back Pain   HPI Theodore Hinton is a 55 y.o. male presents to the ED with left lower back pain for the last week. No known trauma. No dysuria, hesitancy, or urgency. No CP or SOB. No numbness or weakness in the legs. No groin numbness. No urinary retention, incontinence, or stool incontinence. Patient has had sciatica in the past and seen a spine physician but symptoms improved and so has not seen them recently.   Past Medical History:  Diagnosis Date  . Back injury   . Chronic back pain   . Diabetes mellitus without complication (HCC)   . Diverticulosis   . Hypertension     There are no problems to display for this patient.   History reviewed. No pertinent surgical history.  Allergies Patient has no known allergies.  History reviewed. No pertinent family history.  Social History Social History   Tobacco Use  . Smoking status: Never Smoker  . Smokeless tobacco: Current User    Types: Chew  Substance Use Topics  . Alcohol use: Yes    Comment: occasionally  . Drug use: No    Review of Systems  Constitutional: No fever/chills Eyes: No visual changes. ENT: No sore throat. Cardiovascular: Denies chest pain. Respiratory: Denies shortness of breath. Gastrointestinal: No abdominal pain.  Positive left flank pain. No nausea, no vomiting.  No diarrhea.  No constipation. Genitourinary: Negative for dysuria. Musculoskeletal: Positive for back pain. Skin: Negative for rash. Neurological: Negative for headaches, focal weakness or numbness.  10-point ROS otherwise negative.  ____________________________________________   PHYSICAL EXAM:  VITAL SIGNS: ED Triage Vitals  Enc Vitals Group     BP 03/07/21 1258 (!) 195/116     Pulse Rate 03/07/21 1258 85     Resp 03/07/21 1258 18     Temp 03/07/21 1258 98.1 F (36.7 C)     Temp Source  03/07/21 1258 Oral     SpO2 03/07/21 1258 98 %     Weight 03/07/21 1259 178 lb (80.7 kg)     Height 03/07/21 1259 5\' 8"  (1.727 m)   Constitutional: Alert and oriented. Well appearing and in no acute distress. Eyes: Conjunctivae are normal. Head: Atraumatic. Nose: No congestion/rhinnorhea. Mouth/Throat: Mucous membranes are moist.  Neck: No stridor.  Cardiovascular: Normal rate, regular rhythm. Good peripheral circulation. Grossly normal heart sounds.   Respiratory: Normal respiratory effort.  No retractions. Lungs CTAB. Gastrointestinal: Soft and nontender. No distention.  Musculoskeletal: No lower extremity tenderness nor edema. No gross deformities of extremities. Diffuse midline and paraspinal tenderness in the lower back.  Neurologic:  Normal speech and language. No gross focal neurologic deficits are appreciated.  Skin:  Skin is warm, dry and intact. No rash noted.  ____________________________________________  RADIOLOGY  CT renal reviewed.  ____________________________________________   PROCEDURES  Procedure(s) performed:   Procedures  None ____________________________________________   INITIAL IMPRESSION / ASSESSMENT AND PLAN / ED COURSE  Pertinent labs & imaging results that were available during my care of the patient were reviewed by me and considered in my medical decision making (see chart for details).   Patient presents to the ED with 1 week of low back and flank pain. No fever or UTI symptoms. Doubt aortic dissection with 1 week of pain and equal pulses on exam. Seems most consistent with radicular pain vs myofascial strain. No red flag signs/symptoms to  prompt emergent MRI. CT renal with no acute bony abnormality or kidney stone. Plan for supportive care and re-establish care with his spine team. He plans to call on Monday.    ____________________________________________  FINAL CLINICAL IMPRESSION(S) / ED DIAGNOSES  Final diagnoses:  Sciatica of left  side  Left flank pain  Colon wall thickening     MEDICATIONS GIVEN DURING THIS VISIT:  Medications  ketorolac (TORADOL) 30 MG/ML injection 30 mg (30 mg Intramuscular Given 03/07/21 1348)     NEW OUTPATIENT MEDICATIONS STARTED DURING THIS VISIT:  Discharge Medication List as of 03/07/2021  2:50 PM    START taking these medications   Details  diclofenac Sodium (VOLTAREN) 1 % GEL Apply 2 g topically 4 (four) times daily as needed., Starting Sun 03/07/2021, Normal        Note:  This document was prepared using Dragon voice recognition software and may include unintentional dictation errors.  Alona Bene, MD, [facility]    Xiomara Sevillano, Arlyss Repress, MD 03/09/21 [phone]

## 2021-03-07 NOTE — Discharge Instructions (Signed)
You were seen in the emergency room today with back and flank pain.  Your CT scan showed degenerative disc disease in your back but no fractures.  You do not have a kidney stone.  I am treating you with medications for your back pain and will have you call your spine physician team tomorrow to schedule a follow-up appointment.  You may need additional injections in your back or reevaluation if your symptoms do not resolve.  If you develop weakness or numbness in your legs, inability to hold your urine or get your urine completely out, or begin stooling on your self please call 911 and return to the emergency department immediately.  Your CT scan also found some thickening in 2 areas of your sigmoid colon.  The radiologist is recommending a colonoscopy to get a better look at these areas and make sure they are not masses or cancer.  I have placed a referral in our system but if you have seen another gastroenterologist in the past you can reach out to them to schedule a follow-up appointment and schedule a colonoscopy.

## 2021-03-10 ENCOUNTER — Ambulatory Visit: Payer: Medicaid Other | Admitting: Physician Assistant

## 2021-07-01 IMAGING — CT CT RENAL STONE PROTOCOL
2 of 4 series · 15 of 46 positions shown, 17 images · non-contrast
Comparison: 02/04/2019; 07/20/2026

CLINICAL DATA: Left-sided flank pain.

EXAM:
CT ABDOMEN AND PELVIS WITHOUT CONTRAST
TECHNIQUE: Multidetector CT imaging of the abdomen and pelvis was performed
following the standard protocol without IV contrast.

[Series 2: axial st · axial · 0.88mm/px · z∈[+384,+824]mm · 12 of 100 slices shown, 14 images]
[im 8/100  soft-tissue]
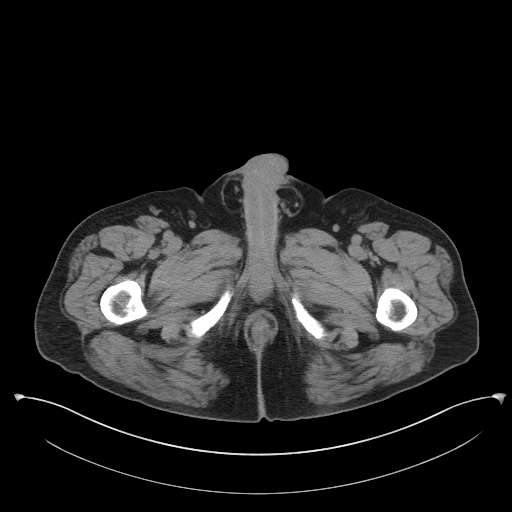
[im 8/100  bone]
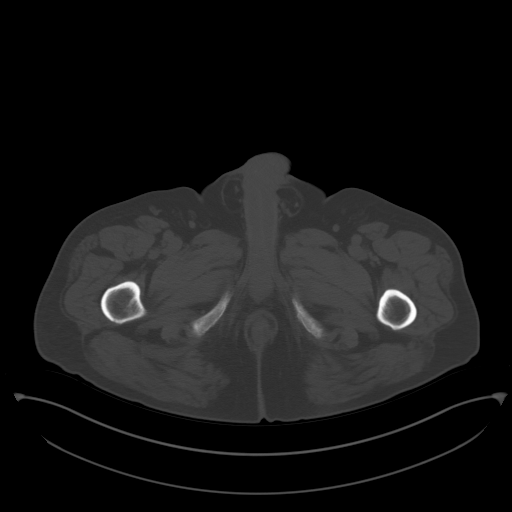
[im 16/100  soft-tissue]
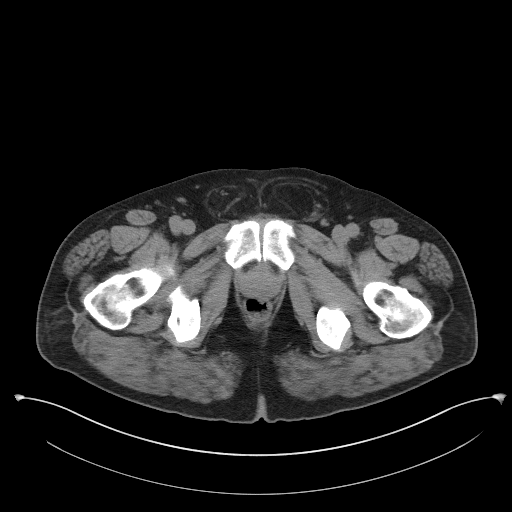
[im 24/100  soft-tissue]
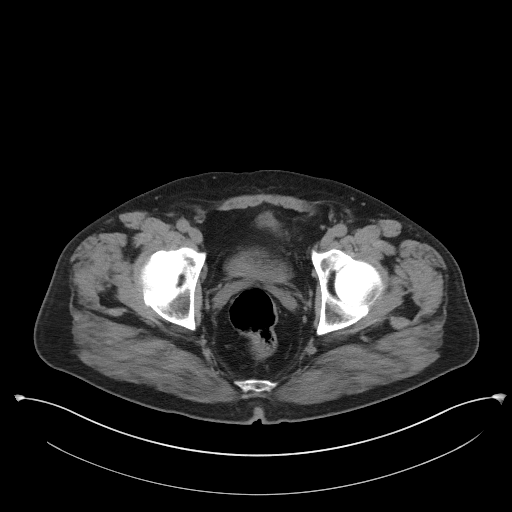
[im 32/100  soft-tissue]
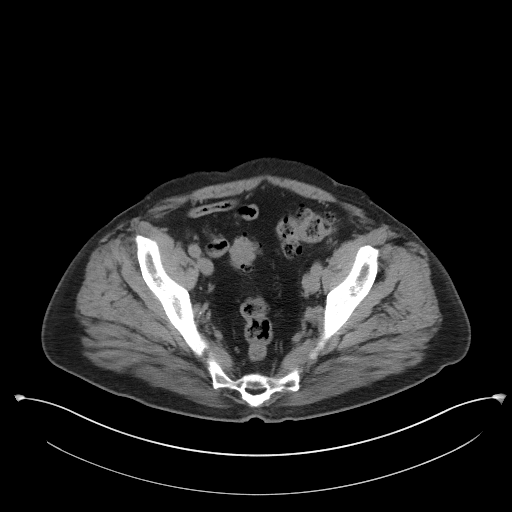
[im 40/100  soft-tissue]
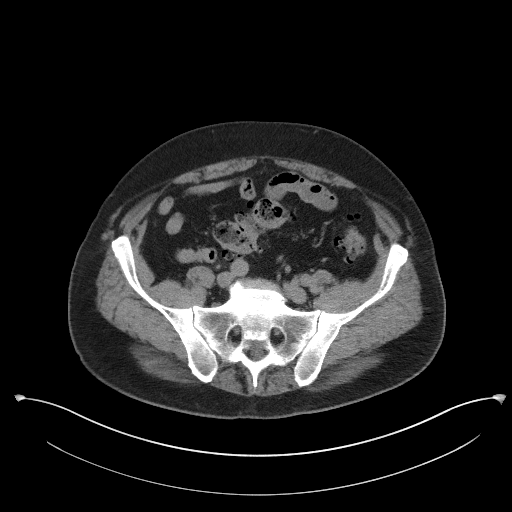
[im 48/100  soft-tissue]
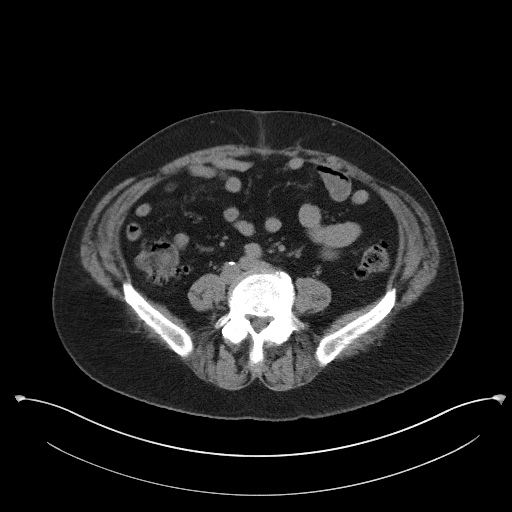
[im 56/100  soft-tissue]
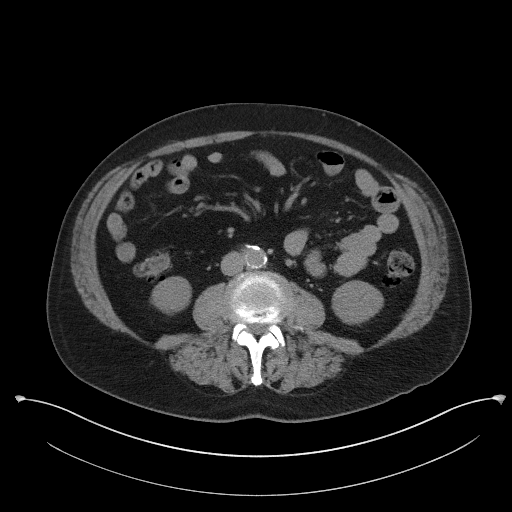
[im 64/100  soft-tissue]
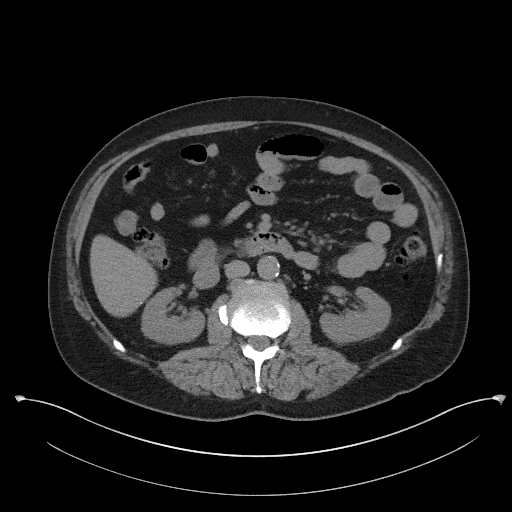
[im 72/100  soft-tissue]
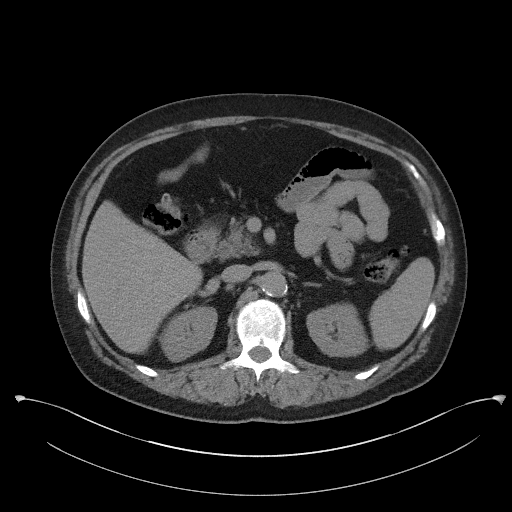
[im 72/100  bone]
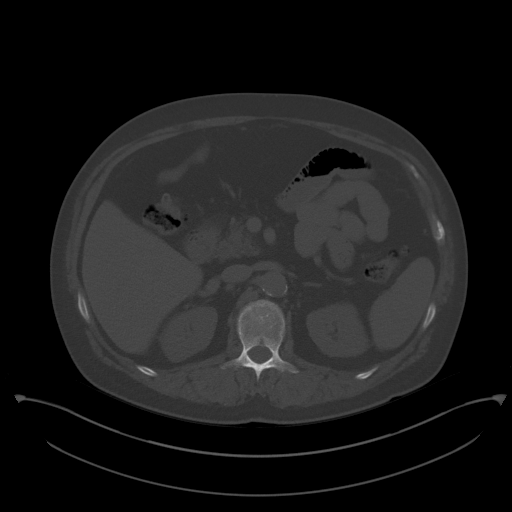
[im 80/100  soft-tissue]
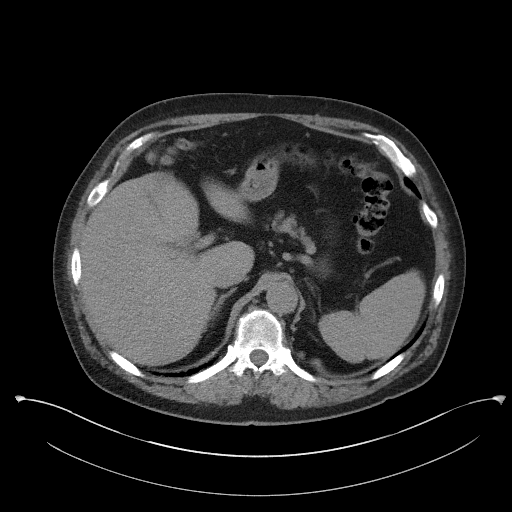
[im 88/100  soft-tissue]
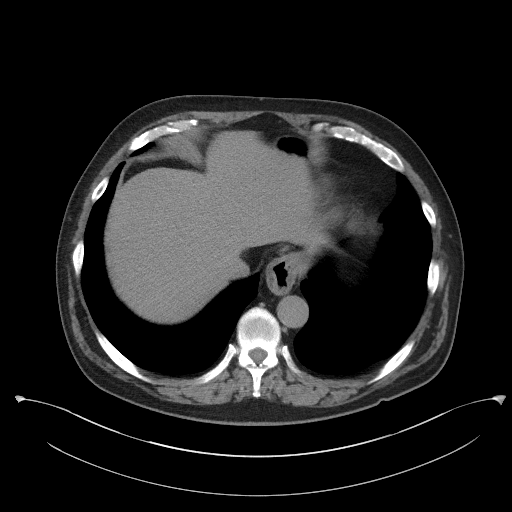
[im 96/100  soft-tissue]
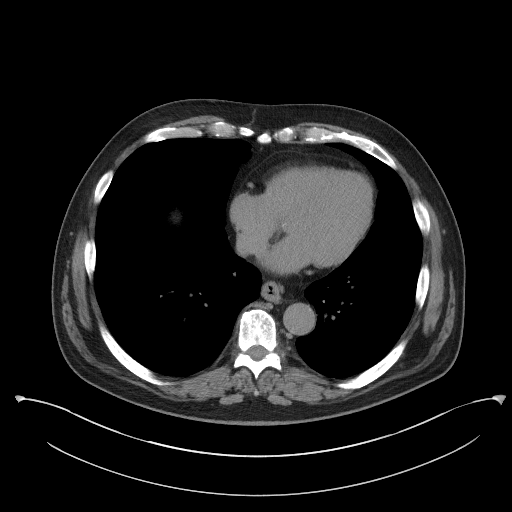

[Series 5: coronal st · coronal · 0.87mm/px · 3 of 103 slices shown]
[im 35/103  soft-tissue]
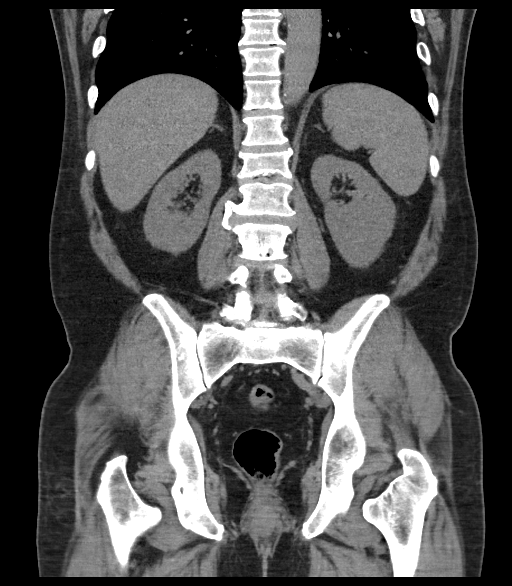
[im 46/103  soft-tissue]
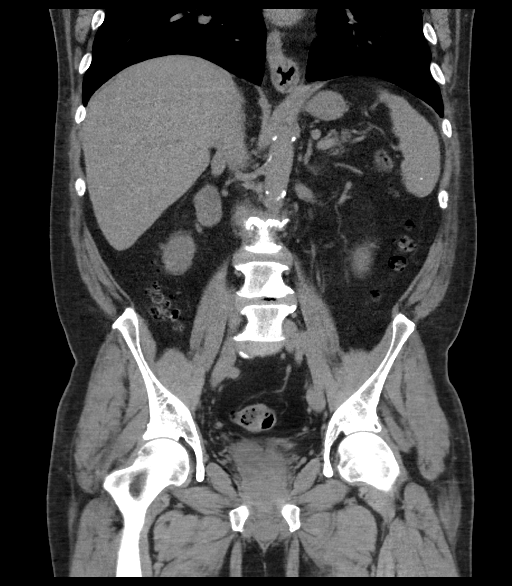
[im 57/103  soft-tissue]
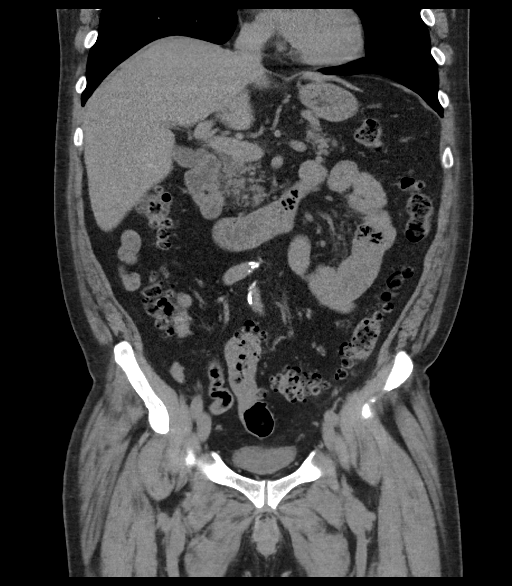

[15 of 46 positions shown; findings below may reference images not displayed]

FINDINGS: The lack of intravenous contrast limits the ability to evaluate
solid abdominal organs.

Lower chest: Limited visualization of the lower thorax is negative
for focal airspace opacity or pleural effusion.

Normal heart size. Calcifications within the root of the ascending
aorta. No pericardial effusion.

Hepatobiliary: Normal hepatic contour. Normal noncontrast appearance
of the gallbladder. No radiopaque gallstones. No ascites.

Pancreas: Normal appearance of the pancreas.

Spleen: Normal appearance of the spleen.

Adrenals/Urinary Tract: Normal noncontrast appearance of the
bilateral kidneys. No renal stones. No renal stones are seen along
the expected location of either ureter or within the urinary
bladder. Several phleboliths are seen with the lower pelvis
bilaterally. Normal noncontrast appearance of the urinary bladder
given underdistention. No urine obstruction or perinephric
stranding.

Normal noncontrast appearance of the bilateral adrenal glands.

Stomach/Bowel: Rather extensive colonic diverticulosis involving
near the entirety of the colon without definitive evidence of
superimposed acute diverticulitis however there are areas of
circumferential wall thickening involving the sigmoid colon within
the left lower abdomen (coronal image 63, series 5) and within the
midline of the pelvis (image 56, series 5), not resulting in enteric
obstruction. Normal noncontrast appearance of the terminal ileum.
Post appendectomy. Small hiatal hernia. No pneumoperitoneum,
pneumatosis or portal venous gas.

Vascular/Lymphatic: Calcified atherosclerotic plaque within a
tortuous but normal caliber abdominal aorta. Incidental note made of
a circumaortic left renal vein.

No bulky retroperitoneal, mesenteric, pelvic or inguinal
lymphadenopathy on this noncontrast examination.

Reproductive: Normal noncontrast appearance of the prostate gland.
No free fluid within the pelvic cul-de-sac.

Other: Small indirect mesenteric fat containing hernias are seen
bilaterally, left greater than right, similar to the [DATE]
examination. Tiny mesenteric fat containing periumbilical hernia.

Musculoskeletal: No acute or aggressive osseous abnormalities.
Moderate severe multilevel lumbar spine DDD, worse at L4-L5 with
disc space height loss, endplate irregularity and sclerosis. Minimal
(approximately 3 mm) of retrolisthesis of L3 upon L4, presumably
degenerative in etiology. Mild degenerative change the bilateral
hips with joint space loss, subchondral sclerosis and subchondral
cyst formation.
IMPRESSION: 1. No definite explanation for patient's left-sided flank pain.
Specifically, no evidence of nephrolithiasis, urinary or enteric
obstruction.
2. Extensive colonic diverticulosis without definitive evidence of
superimposed acute diverticulitis,
3. Two discrete areas of circumferential wall thickening involving
the sigmoid colon not resulting in enteric obstruction. If not
recently performed, further evaluation with colonoscopy is advised
to exclude the presence of an underlying mass.
4. Small hiatal hernia.
5. Small bilateral mesenteric fat containing indirect inguinal
hernias, left greater than right.
6. Moderate severe multilevel lumbar spine DDD, worse at L4-L5.
7. Aortic atherosclerosis ([id number]).

## 2021-07-13 DIAGNOSIS — E1142 Type 2 diabetes mellitus with diabetic polyneuropathy: Secondary | ICD-10-CM | POA: Insufficient documentation

## 2021-07-28 ENCOUNTER — Other Ambulatory Visit: Payer: Self-pay | Admitting: Orthopedic Surgery

## 2021-07-28 DIAGNOSIS — G8929 Other chronic pain: Secondary | ICD-10-CM

## 2021-08-18 ENCOUNTER — Inpatient Hospital Stay: Admission: RE | Admit: 2021-08-18 | Payer: Medicaid Other | Source: Ambulatory Visit

## 2022-01-06 ENCOUNTER — Emergency Department (HOSPITAL_BASED_OUTPATIENT_CLINIC_OR_DEPARTMENT_OTHER)
Admission: EM | Admit: 2022-01-06 | Discharge: 2022-01-06 | Disposition: A | Discharge status: Home / Self Care | Payer: Medicaid Other | Attending: Emergency Medicine | Admitting: Emergency Medicine

## 2022-01-06 ENCOUNTER — Emergency Department (HOSPITAL_BASED_OUTPATIENT_CLINIC_OR_DEPARTMENT_OTHER): Payer: Medicaid Other

## 2022-01-06 ENCOUNTER — Other Ambulatory Visit: Payer: Self-pay

## 2022-01-06 ENCOUNTER — Encounter (HOSPITAL_BASED_OUTPATIENT_CLINIC_OR_DEPARTMENT_OTHER): Payer: Self-pay | Admitting: *Deleted

## 2022-01-06 DIAGNOSIS — Z79899 Other long term (current) drug therapy: Secondary | ICD-10-CM | POA: Insufficient documentation

## 2022-01-06 DIAGNOSIS — M5410 Radiculopathy, site unspecified: Secondary | ICD-10-CM | POA: Insufficient documentation

## 2022-01-06 DIAGNOSIS — I160 Hypertensive urgency: Secondary | ICD-10-CM

## 2022-01-06 DIAGNOSIS — I999 Unspecified disorder of circulatory system: Secondary | ICD-10-CM

## 2022-01-06 DIAGNOSIS — I10 Essential (primary) hypertension: Secondary | ICD-10-CM | POA: Insufficient documentation

## 2022-01-06 DIAGNOSIS — R072 Precordial pain: Secondary | ICD-10-CM | POA: Insufficient documentation

## 2022-01-06 DIAGNOSIS — Z7984 Long term (current) use of oral hypoglycemic drugs: Secondary | ICD-10-CM | POA: Insufficient documentation

## 2022-01-06 DIAGNOSIS — R202 Paresthesia of skin: Secondary | ICD-10-CM | POA: Insufficient documentation

## 2022-01-06 DIAGNOSIS — E119 Type 2 diabetes mellitus without complications: Secondary | ICD-10-CM | POA: Insufficient documentation

## 2022-01-06 LAB — BASIC METABOLIC PANEL
Anion gap: 9 (ref 5–15)
BUN: 9 mg/dL (ref 6–20)
CO2: 24 mmol/L (ref 22–32)
Calcium: 8.5 mg/dL — ABNORMAL LOW (ref 8.9–10.3)
Chloride: 104 mmol/L (ref 98–111)
Creatinine, Ser: 0.77 mg/dL (ref 0.61–1.24)
GFR, Estimated: >60 mL/min (ref >60)
Glucose, Bld: 222 mg/dL — ABNORMAL HIGH (ref 70–99)
Potassium: 3.5 mmol/L (ref 3.5–5.1)
Sodium: 137 mmol/L (ref 135–145)

## 2022-01-06 LAB — CBC
HCT: 45.2 % (ref 39.0–52.0)
Hemoglobin: 16.5 g/dL (ref 13.0–17.0)
MCH: 32 pg (ref 26.0–34.0)
MCHC: 36.5 g/dL — ABNORMAL HIGH (ref 30.0–36.0)
MCV: 87.6 fL (ref 80.0–100.0)
Platelets: 215 10*3/uL (ref 150–400)
RBC: 5.16 MIL/uL (ref 4.22–5.81)
RDW: 12.4 % (ref 11.5–15.5)
WBC: 10.4 10*3/uL (ref 4.0–10.5)
nRBC: 0 % (ref 0.0–0.2)

## 2022-01-06 LAB — TROPONIN I (HIGH SENSITIVITY)
Troponin I (High Sensitivity): 4 ng/L (ref <18)
Troponin I (High Sensitivity): 4 ng/L (ref <18)

## 2022-01-06 LAB — CBG MONITORING, ED: Glucose-Capillary: 236 mg/dL — ABNORMAL HIGH (ref 70–99)

## 2022-01-06 MED ORDER — OXYCODONE-ACETAMINOPHEN 5-325 MG PO TABS
1.0000 | ORAL_TABLET | Freq: Once | ORAL | Status: AC
  Administered 2022-01-06: 1 via ORAL
  Filled 2022-01-06: qty 1

## 2022-01-06 MED ORDER — PREDNISONE 10 MG PO TABS
50.0000 mg | ORAL_TABLET | Freq: Every day | ORAL | 0 refills | Status: DC
  Filled 2022-01-06: qty 25, 5d supply, fill #0

## 2022-01-06 MED ORDER — MORPHINE SULFATE (PF) 4 MG/ML IV SOLN
8.0000 mg | Freq: Once | INTRAVENOUS | Status: AC
  Administered 2022-01-06: 8 mg via INTRAVENOUS
  Filled 2022-01-06: qty 2

## 2022-01-06 MED ORDER — AMLODIPINE BESYLATE 5 MG PO TABS
5.0000 mg | ORAL_TABLET | Freq: Every day | ORAL | 1 refills | Status: DC
  Filled 2022-01-06: qty 30, 30d supply, fill #0

## 2022-01-06 MED ORDER — METHOCARBAMOL 500 MG PO TABS
500.0000 mg | ORAL_TABLET | Freq: Two times a day (BID) | ORAL | 0 refills | Status: DC
  Filled 2022-01-06: qty 10, 5d supply, fill #0

## 2022-01-06 MED ORDER — NAPROXEN 500 MG PO TABS
500.0000 mg | ORAL_TABLET | Freq: Two times a day (BID) | ORAL | 0 refills | Status: DC
  Filled 2022-01-06: qty 20, 10d supply, fill #0

## 2022-01-06 MED ORDER — OXYCODONE-ACETAMINOPHEN 5-325 MG PO TABS
1.0000 | ORAL_TABLET | Freq: Four times a day (QID) | ORAL | 0 refills | Status: DC | PRN
  Filled 2022-01-06: qty 6, 2d supply, fill #0

## 2022-01-06 MED ORDER — ASPIRIN 81 MG PO CHEW
324.0000 mg | CHEWABLE_TABLET | Freq: Once | ORAL | Status: AC
  Administered 2022-01-06: 324 mg via ORAL
  Filled 2022-01-06: qty 4

## 2022-01-06 NOTE — Discharge Instructions (Signed)
You were seen in the ER for back pain, leg pain and chest pain.  Your cardiac work-up is normal.  We recommend that you see your primary care doctor to see if you need further evaluation. Please return to the ER if you have worsening chest pain, shortness of breath, pain radiating to your jaw, shoulder, or back, sweats or fainting. Otherwise see the Cardiologist or your primary care doctor as requested.  Your leg evaluation right now is normal.  The pain appears to be radicular in nature.  It appears that your spine doctor has ordered an MRI of your back.  We recommend that you contact your spine surgeon as soon as possible to figure out how to get that MRI completed.  We also recommend that you see your spine surgeon for further evaluation after the MRI.  Finally, we are unsure why your legs turn blue.  We recommend that he see the vascular surgeon for further evaluation.   Return to the ER if you start having worsening back pain, your legs turn blue again.
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
## 2022-01-06 NOTE — ED Notes (Signed)
Dr. Rhunette Croft aware of pts BP of 188/95, states ok to discharge. Pt prescribed BP meds and informed to follow up with cardiologist and PCP. Pt A&OX4 ambulatory at d/c with independent steady gait. Pt verbalized understanding of d/c instructions, prescriptions and the importance of follow up care.

## 2022-01-06 NOTE — ED Provider Notes (Signed)
[facility] EMERGENCY DEPARTMENT Provider Note   CSN: [id number] Arrival date & time: 01/06/22  1412     History  Chief Complaint  Patient presents with   Back Pain   Chest Pain    Theodore Hinton is a 56 y.o. male.  HPI    56 year old male comes in with chief complaint of chest pain and back pain.  Patient has history of hypertension, per chronic back pain, diabetes and severe back injury.  He indicates, that about 2 weeks ago he started having back pain.  Back pain is radiating down his left leg.  About a week ago, he had an episode where both of his legs turn blue.  That episode lasted for few minutes and then resolved spontaneously.  He also had associated numbness in his feet.  The back pain persisted.  He also had an episode of chest pain that lasted just for few seconds about a week ago.  Chest pain is described as sharp pain, and it was nonradiating.  He did not have any associated shortness of breath.  Patient denies any abdominal pain.  At no point did he have any focal one-sided numbness, weakness, slurred speech.   Patient denies any heavy smoking, drinking, illicit drug use.  He does not have any coronary artery disease or stroke history.  Now his primary issue has been back pain.  The back pain starts in the back and radiates down the left leg.  He has history of sciatica, the pain is similar to that but has been quite intense.  He is taking over-the-counter medications without significant relief.  In the last week, he has not had any chest pain.  He denies any exertional chest pain or shortness of breath.  He also denies any discoloration of his leg in the last week.   Home Medications Prior to Admission medications   Medication Sig Start Date End Date Taking? Authorizing Provider  methocarbamol (ROBAXIN) 500 MG tablet Take 1 tablet (500 mg total) by mouth 2 (two) times daily. 01/06/22  Yes Lania Zawistowski, MD  naproxen (NAPROSYN) 500 MG tablet Take 1  tablet (500 mg total) by mouth 2 (two) times daily. 01/06/22  Yes Derwood KaplanNanavati, Romin Divita, MD  oxyCODONE-acetaminophen (PERCOCET/ROXICET) 5-325 MG tablet Take 1 tablet by mouth every 6 (six) hours as needed for severe pain. 01/06/22  Yes Derwood KaplanNanavati, Annalaura Sauseda, MD  predniSONE (DELTASONE) 10 MG tablet Take 5 tablets (50 mg total) by mouth daily. 01/06/22  Yes Derwood KaplanNanavati, Tangy Drozdowski, MD  diclofenac Sodium (VOLTAREN) 1 % GEL Apply 2 g topically 4 (four) times daily as needed. 03/07/21   Long, Arlyss RepressJoshua G, MD  esomeprazole (NEXIUM) 40 MG capsule Take 1 capsule (40 mg total) by mouth daily. 12/01/20   Charlynne PanderYao, David Hsienta, MD  gabapentin (NEURONTIN) 300 MG capsule Take by mouth. 02/21/17   [provider]  lisinopril (PRINIVIL,ZESTRIL) 20 MG tablet Take 1 tablet (20 mg total) by mouth daily. 07/19/16   Bethann BerkshireZammit, Joseph, MD  metFORMIN (GLUCOPHAGE) 500 MG tablet Take 1 tablet (500 mg total) by mouth 2 (two) times daily with a meal. 12/01/20   Charlynne PanderYao, David Hsienta, MD  ondansetron (ZOFRAN ODT) 4 MG disintegrating tablet 4mg  ODT q4 hours prn nausea/vomit 12/01/20   Charlynne PanderYao, David Hsienta, MD  ondansetron (ZOFRAN) 4 MG tablet Take 1 tablet (4 mg total) by mouth every 8 (eight) hours as needed for nausea or vomiting. 07/20/16   Samuel JesterMcManus, Kathleen, DO      Allergies    Patient has  no known allergies.    Review of Systems   Review of Systems  All other systems reviewed and are negative.  Physical Exam Updated Vital Signs BP (!) 188/95    Pulse 64    Temp 98.3 F (36.8 C) (Oral)    Resp 20    Ht 5\' 8"  (1.727 m)    Wt 80.7 kg    SpO2 99%    BMI 27.05 kg/m  Physical Exam Vitals and nursing note reviewed.  Constitutional:      Appearance: He is well-developed.  HENT:     Head: Atraumatic.  Cardiovascular:     Rate and Rhythm: Normal rate.     Pulses:          Radial pulses are 2+ on the right side and 2+ on the left side.       Dorsalis pedis pulses are 1+ on the right side and 1+ on the left side.     Heart sounds: Normal heart  sounds. No murmur heard. Pulmonary:     Effort: Pulmonary effort is normal.     Breath sounds: Normal breath sounds.  Abdominal:     General: There is no abdominal bruit.     Palpations: There is no mass.     Tenderness: There is no abdominal tenderness.  Musculoskeletal:     Cervical back: Neck supple.     Right lower leg: No edema.     Left lower leg: No edema.     Comments: Positive passive straight leg raise test on the left side, focal lumbar spine tenderness over L4-L6  Skin:    General: Skin is warm.     Coloration: Skin is not cyanotic or pale.     Findings: No ecchymosis or erythema.  Neurological:     Mental Status: He is alert and oriented to person, place, and time.    ED Results / Procedures / Treatments   Labs (all labs ordered are listed, but only abnormal results are displayed) Labs Reviewed  BASIC METABOLIC PANEL - Abnormal; Notable for the following components:      Result Value   Glucose, Bld 222 (*)    Calcium 8.5 (*)    All other components within normal limits  CBC - Abnormal; Notable for the following components:   MCHC 36.5 (*)    All other components within normal limits  CBG MONITORING, ED - Abnormal; Notable for the following components:   Glucose-Capillary 236 (*)    All other components within normal limits  TROPONIN I (HIGH SENSITIVITY)  TROPONIN I (HIGH SENSITIVITY)    EKG EKG Interpretation  Date/Time:  Thursday January 06 2022 14:31:08 EST Ventricular Rate:  85 PR Interval:  186 QRS Duration: 78 QT Interval:  382 QTC Calculation: 454 R Axis:   -12 Text Interpretation: Normal sinus rhythm Low voltage QRS Inferior infarct , age undetermined Cannot rule out Anterior infarct , age undetermined Abnormal ECG When compared with ECG of 01-Dec-2020 11:23, No significant change since last tracing Confirmed by 03-Dec-2020 [phone]) on 01/06/2022 3:04:56 PM  Radiology DG Chest Portable 1 View  Result Date: 01/06/2022 CLINICAL DATA:  Chest  pain EXAM: PORTABLE CHEST 1 VIEW COMPARISON:  12/01/2020 FINDINGS: The heart size and mediastinal contours are within normal limits. Both lungs are clear. No pleural effusion or pneumothorax. The visualized skeletal structures are unremarkable. IMPRESSION: No active disease. Electronically Signed   By: 12/03/2020 M.D.   On: 01/06/2022 16:13    Procedures Procedures  Medications Ordered in ED Medications  aspirin chewable tablet 324 mg (324 mg Oral Given 01/06/22 1603)  morphine (PF) 4 MG/ML injection 8 mg (8 mg Intravenous Given 01/06/22 1604)  oxyCODONE-acetaminophen (PERCOCET/ROXICET) 5-325 MG per tablet 1 tablet (1 tablet Oral Given 01/06/22 1913)    ED Course/ Medical Decision Making/ A&P                           Medical Decision Making Problems Addressed: Hypertensive urgency: chronic illness or injury with severe exacerbation, progression, or side effects of treatment    Details: Poor Lee controlled, noncompliant Impaired circulation of lower extremity: undiagnosed new problem with uncertain prognosis Precordial chest pain: undiagnosed new problem with uncertain prognosis Radicular pain: chronic illness or injury with exacerbation, progression, or side effects of treatment  Amount and/or Complexity of Data Reviewed Independent Historian: friend External Data Reviewed: radiology and notes. Labs: ordered. Radiology: ordered and independent interpretation performed.    Details: X-ray of the chest is negative for any acute findings.  No pleural effusion, no mediastinal widening. ECG/medicine tests: ordered.  Risk OTC drugs. Prescription drug management.    This patient presents to the ED with chief complaint(s) of back pain radiating to his leg.  That is his primary complaint.  Secondarily, he mentions that a week ago he had chest pain and also his legs turned blue.   With pertinent past medical history of uncontrolled hypertension, diabetes which further complicates the  presenting complaint. The complaint involves an extensive differential diagnosis and treatment options and also carries with it a high risk of complications and morbidity.    The differential diagnosis includes  Differential diagnosis includes: ACS syndrome Aortic dissection Pericardial effusion / tamponade PE Musculoskeletal pain PUD / Gastritis / Esophagitis Esophageal spasm Aortic thrombosis AAA Herniated disc Sciatica   Had a long conversation with the patient.  He has multiple complaints which cover multiple days.  His primary complaint is leg pain and back pain. The initial plan is to review patient's prior work-up including the MRI and CT scan of his abdomen.  Start with basic labs, troponins, EKG and chest x-ray.  Reassess the patient and decide if further advanced imaging is indicated.  Additional history obtained: Records reviewed  previous MRI lumbar spine and CT scan abdomen and pelvis  Reassessment and review: Lab Tests: I Ordered, and personally interpreted labs.  The pertinent results include: Normal-appearing CBC, initial troponin which is also below cutoff for myocardial injury, normal creatinine, normal white count.   Imaging Studies ordered: I independently visualized and interpreted the following imaging X-ray of the chest which is reassuring    Patient reassessed again. Our plan is that patient will follow-up with his PCP for blood pressure management and chest pain evaluation. We have concluded that his back pain likely is because of sciatica and not because of life-threatening issue such as dissection or AAA at this time.  His abdominal exam, vascular exam are all reassuring and he has not had chest pain since that 1 episode last week.  We will not proceed with CT dissection study.  I have reviewed patient's prior CT scans there were no evidence of AAA at that time which is also reassuring.  It appears that a lumbar MRI has been ordered by his spine doctor.  We  have advised that the patient call the clinic to ensure that he gets the spine MRI that was ordered and then follow-up with the spine team.  In the interim we will focus on pain control.  For the blue legs, there is no claudication and he has palpable and equal dorsalis pedis bilaterally.  Patient is quite sure that his extremity had turned blue, his friend at the bedside also confirms.  We will send him to vascular surgery to see if they can further help with diagnosis.  Strict ER return precautions have been discussed, and patient is agreeing with the plan and is comfortable with the workup done and the recommendations from the ER.   Cardiac Monitoring: The patient was maintained on a cardiac monitor.  I personally viewed and interpreted the cardiac monitor which showed an underlying rhythm of:  sinus rhythm  Medicines ordered and prescription drug management: I ordered the following medications IV pain medication for back pain, patient did get better with the medicine.   Reevaluation of the patient after these medicines showed that the patient    improved  Complexity of problems addressed: Patients presentation is most consistent with  exacerbation of chronic illness, acute illness/injury with systemic symptoms, and acute complicated illness/injury requiring diagnostic workup During patient's assessment  Disposition: After consideration of the diagnostic results and the patients response to treatment,  I feel that the patent would benefit from discharge   .    Final Clinical Impression(s) / ED Diagnoses Final diagnoses:  Radicular pain  Precordial chest pain  Impaired circulation of lower extremity    Rx / DC Orders ED Discharge Orders          Ordered    oxyCODONE-acetaminophen (PERCOCET/ROXICET) 5-325 MG tablet  Every 6 hours PRN        01/06/22 1909    naproxen (NAPROSYN) 500 MG tablet  2 times daily        01/06/22 1909    predniSONE (DELTASONE) 10 MG tablet  Daily         01/06/22 1909    methocarbamol (ROBAXIN) 500 MG tablet  2 times daily        01/06/22 1909              Derwood Kaplan, MD 01/06/22 [phone]

## 2022-01-07 ENCOUNTER — Other Ambulatory Visit (HOSPITAL_BASED_OUTPATIENT_CLINIC_OR_DEPARTMENT_OTHER): Payer: Self-pay

## 2022-02-28 ENCOUNTER — Encounter (HOSPITAL_BASED_OUTPATIENT_CLINIC_OR_DEPARTMENT_OTHER): Payer: Self-pay | Admitting: Urology

## 2022-02-28 ENCOUNTER — Emergency Department (HOSPITAL_BASED_OUTPATIENT_CLINIC_OR_DEPARTMENT_OTHER)
Admission: EM | Admit: 2022-02-28 | Discharge: 2022-02-28 | Disposition: A | Discharge status: Home / Self Care | Payer: Medicaid Other | Attending: Emergency Medicine | Admitting: Emergency Medicine

## 2022-02-28 ENCOUNTER — Other Ambulatory Visit: Payer: Self-pay

## 2022-02-28 DIAGNOSIS — M549 Dorsalgia, unspecified: Secondary | ICD-10-CM | POA: Insufficient documentation

## 2022-02-28 DIAGNOSIS — Z7984 Long term (current) use of oral hypoglycemic drugs: Secondary | ICD-10-CM | POA: Insufficient documentation

## 2022-02-28 DIAGNOSIS — G8929 Other chronic pain: Secondary | ICD-10-CM | POA: Insufficient documentation

## 2022-02-28 DIAGNOSIS — Z79899 Other long term (current) drug therapy: Secondary | ICD-10-CM | POA: Insufficient documentation

## 2022-02-28 DIAGNOSIS — I10 Essential (primary) hypertension: Secondary | ICD-10-CM | POA: Insufficient documentation

## 2022-02-28 DIAGNOSIS — E119 Type 2 diabetes mellitus without complications: Secondary | ICD-10-CM | POA: Insufficient documentation

## 2022-02-28 MED ORDER — DEXAMETHASONE SODIUM PHOSPHATE 10 MG/ML IJ SOLN
10.0000 mg | Freq: Once | INTRAMUSCULAR | Status: AC
  Administered 2022-02-28: 10 mg via INTRAMUSCULAR
  Filled 2022-02-28: qty 1

## 2022-02-28 MED ORDER — HYDROMORPHONE HCL 1 MG/ML IJ SOLN
1.0000 mg | Freq: Once | INTRAMUSCULAR | Status: AC
  Administered 2022-02-28: 1 mg via INTRAMUSCULAR
  Filled 2022-02-28: qty 1

## 2022-02-28 MED ORDER — OXYCODONE-ACETAMINOPHEN 5-325 MG PO TABS: 1.0000 | ORAL_TABLET | Freq: Four times a day (QID) | ORAL | 0 refills | Status: DC | PRN
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
## 2022-02-28 NOTE — ED Provider Notes (Signed)
?[facility] EMERGENCY DEPARTMENT ?Provider Note ? ? ?CSN: [id number] ?Arrival date & time: 02/28/22  1836 ? ?  ? ?History ? ?Chief Complaint  ?Patient presents with  ? Hip Pain  ? ? ?Theodore Hinton is a 56 y.o. male. ? ? ?Hip Pain ? ? ?This is a 56 year old male with chronic back pain x8 years, hypertension, diabetes presenting today with hip pain/back pain.  The pain has been a constant problem for the last 8 years, it is flared up in the last few weeks.  He denies any trauma to the back but does state he has been exerting himself more physically.  He is not having any lower extremity weakness, urinary incontinence or stool incontinence, saddle anesthesia.  No fevers, no nausea or vomiting.  Has been trying his ibuprofen and Tylenol and steroid packs but none of that has been helping.  States it feels like his normal pain but it is worse and he just needs "some relief".  He has an MRI scheduled for 1 April through his neurosurgeon Dr. Monday.  States he is satisfied with the care and he used to get steroid injections in his back today having done that for some time and he is not sure why.  He states he would like recommendations for secondary neurosurgeon.  He states he has not tried physical therapy as the surgeon states this is a surgical issue and physical therapy did not help for the patient. ? ?Home Medications ?Prior to Admission medications   ?Medication Sig Start Date End Date Taking? Authorizing Provider  ?amLODipine (NORVASC) 5 MG tablet Take 1 tablet (5 mg total) by mouth daily. 01/06/22   Derwood Kaplan, MD  ?diclofenac Sodium (VOLTAREN) 1 % GEL Apply 2 g topically 4 (four) times daily as needed. 03/07/21   Long, Arlyss Repress, MD  ?esomeprazole (NEXIUM) 40 MG capsule Take 1 capsule (40 mg total) by mouth daily. 12/01/20   Charlynne Pander, MD  ?gabapentin (NEURONTIN) 300 MG capsule Take by mouth. 02/21/17   [provider]  ?lisinopril (PRINIVIL,ZESTRIL) 20 MG tablet Take 1 tablet (20 mg  total) by mouth daily. 07/19/16   Bethann Berkshire, MD  ?metFORMIN (GLUCOPHAGE) 500 MG tablet Take 1 tablet (500 mg total) by mouth 2 (two) times daily with a meal. 12/01/20   Charlynne Pander, MD  ?methocarbamol (ROBAXIN) 500 MG tablet Take 1 tablet (500 mg total) by mouth 2 (two) times daily. 01/06/22   Derwood Kaplan, MD  ?naproxen (NAPROSYN) 500 MG tablet Take 1 tablet (500 mg total) by mouth 2 (two) times daily. 01/06/22   Derwood Kaplan, MD  ?ondansetron (ZOFRAN ODT) 4 MG disintegrating tablet 4mg  ODT q4 hours prn nausea/vomit 12/01/20   12/03/20, MD  ?ondansetron (ZOFRAN) 4 MG tablet Take 1 tablet (4 mg total) by mouth every 8 (eight) hours as needed for nausea or vomiting. 07/20/16   07/22/16, DO  ?oxyCODONE-acetaminophen (PERCOCET/ROXICET) 5-325 MG tablet Take 1 tablet by mouth every 6 (six) hours as needed for severe pain. 01/06/22   03/06/22, MD  ?predniSONE (DELTASONE) 10 MG tablet Take 5 tablets (50 mg total) by mouth daily. 01/06/22   03/06/22, MD  ?   ? ?Allergies    ?Patient has no known allergies.   ? ?Review of Systems   ?Review of Systems ? ?Physical Exam ?Updated Vital Signs ?BP (!) 186/120 (BP Location: Right Arm)   Pulse 86   Temp 98.1 ?F (36.7 ?C) (Oral)   Resp 16  Ht 5\' 8"  (1.727 m)   Wt 80.7 kg   SpO2 97%   BMI 27.05 kg/m?  ?Physical Exam ?Vitals and nursing note reviewed. Exam conducted with a chaperone present.  ?Constitutional:   ?   General: He is not in acute distress. ?   Appearance: Normal appearance.  ?HENT:  ?   Head: Normocephalic and atraumatic.  ?Eyes:  ?   General: No scleral icterus. ?   Extraocular Movements: Extraocular movements intact.  ?   Pupils: Pupils are equal, round, and reactive to light.  ?Cardiovascular:  ?   Rate and Rhythm: Normal rate and regular rhythm.  ?   Pulses: Normal pulses.  ?Musculoskeletal:     ?   General: Tenderness present.  ?Skin: ?   Coloration: Skin is not jaundiced.  ?Neurological:  ?   Mental Status: He is  alert. Mental status is at baseline.  ?   Coordination: Coordination normal.  ?   Comments: Cranial nerves III through XII are grossly intact.  Grip strength equal bilaterally, patellar reflex intact.  Positive straight leg raise bilaterally.  ? ? ?ED Results / Procedures / Treatments   ?Labs ?(all labs ordered are listed, but only abnormal results are displayed) ?Labs Reviewed - No data to display ? ?EKG ?None ? ?Radiology ?No results found. ? ?Procedures ?Procedures  ? ? ?Medications Ordered in ED ?Medications - No data to display ? ?ED Course/ Medical Decision Making/ A&P ?  ?                        ?Medical Decision Making ?Risk ?Prescription drug management. ? ? ?This is a 56 year old male presenting today with back pain.  His colleague is an independent story and at bedside.  Physical exam is notable for positive straight leg raise bilaterally.  There are no new neurofocal deficits.  His vitals are stable, he is not febrile but he is hypertensive at 186/120.  States his blood pressure is "always high and he is in pain.  I reviewed his chart, this has been an issue for some time, he does have a MRI scheduled.  I do think this is an acute on chronic exacerbation of his chronic pain.  I did consider cauda equina, epidural abscess, slipped disc but based on physical exam and history I do not think anything new or emergent is the source.  We will provide patient a shot of pain medicine and steroids here.  I encouraged him to follow-up with his neurosurgeon, will provide resources for a second surgery group if he wanted second opinion.  Give him a short course of pain medicine, encouraged him to continue taking the Tylenol and ibuprofen as prescribed by his neurosurgeon.  Patient is comfortable with this plan. ? ? ? ? ? ? ? ?Final Clinical Impression(s) / ED Diagnoses ?Final diagnoses:  ?None  ? ? ?Rx / DC Orders ?ED Discharge Orders   ? ? None  ? ?  ? ? ?  ?56, PA-C ?02/28/22 2300 ? ?  ?03/02/22,  MD ?03/10/22 0715 ? ?

## 2022-02-28 NOTE — Discharge Instructions (Addendum)
Follow-up with neurosurgery.  Take the pain medicine as needed.  ?

## 2022-03-20 ENCOUNTER — Encounter (HOSPITAL_COMMUNITY): Payer: Self-pay | Admitting: Emergency Medicine

## 2022-03-20 ENCOUNTER — Emergency Department (HOSPITAL_COMMUNITY)
Admission: EM | Admit: 2022-03-20 | Discharge: 2022-03-21 | Discharge status: Against Medical Advice | Payer: Managed Care, Other (non HMO) | Attending: Physician Assistant | Admitting: Physician Assistant

## 2022-03-20 DIAGNOSIS — Z5321 Procedure and treatment not carried out due to patient leaving prior to being seen by health care provider: Secondary | ICD-10-CM | POA: Diagnosis not present

## 2022-03-20 DIAGNOSIS — G8929 Other chronic pain: Secondary | ICD-10-CM | POA: Insufficient documentation

## 2022-03-20 DIAGNOSIS — M545 Low back pain, unspecified: Secondary | ICD-10-CM | POA: Insufficient documentation

## 2022-03-20 NOTE — ED Triage Notes (Signed)
Patient here with complaint of chronic lower back pain, has an MRI scheduled to evaluate pain, patient states he has no medications to help with pain at home and requests help with pain management. Patient is alert, oriented, and in no apparent distress at this time. ?

## 2022-03-21 MED ORDER — OXYCODONE-ACETAMINOPHEN 5-325 MG PO TABS
1.0000 | ORAL_TABLET | Freq: Once | ORAL | Status: AC
  Administered 2022-03-21: 1 via ORAL
  Filled 2022-03-21 (×2): qty 1

## 2022-05-02 IMAGING — DX DG CHEST 1V PORT
2 series · 2 of 2 positions shown · non-contrast
Comparison: 12/01/2020

CLINICAL DATA: Chest pain

EXAM:
PORTABLE CHEST 1 VIEW

[chest ap (1 of 2)]
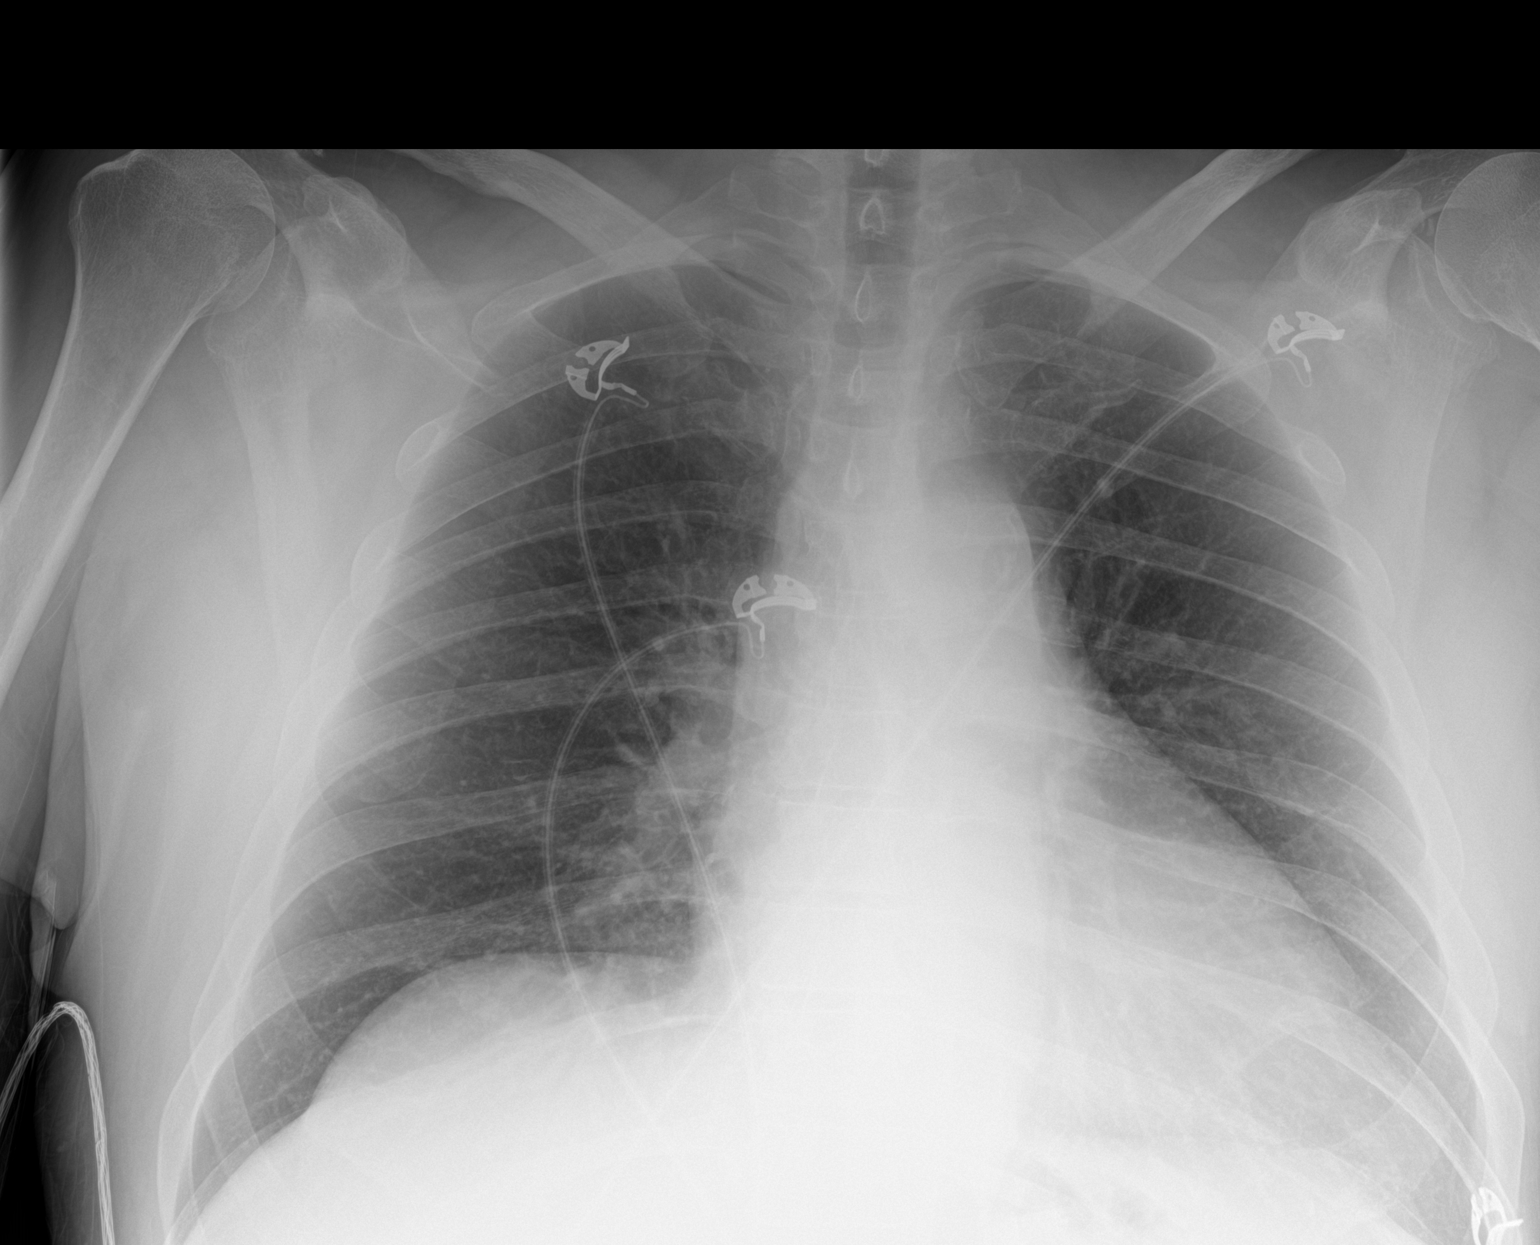

[chest ap (2 of 2)]
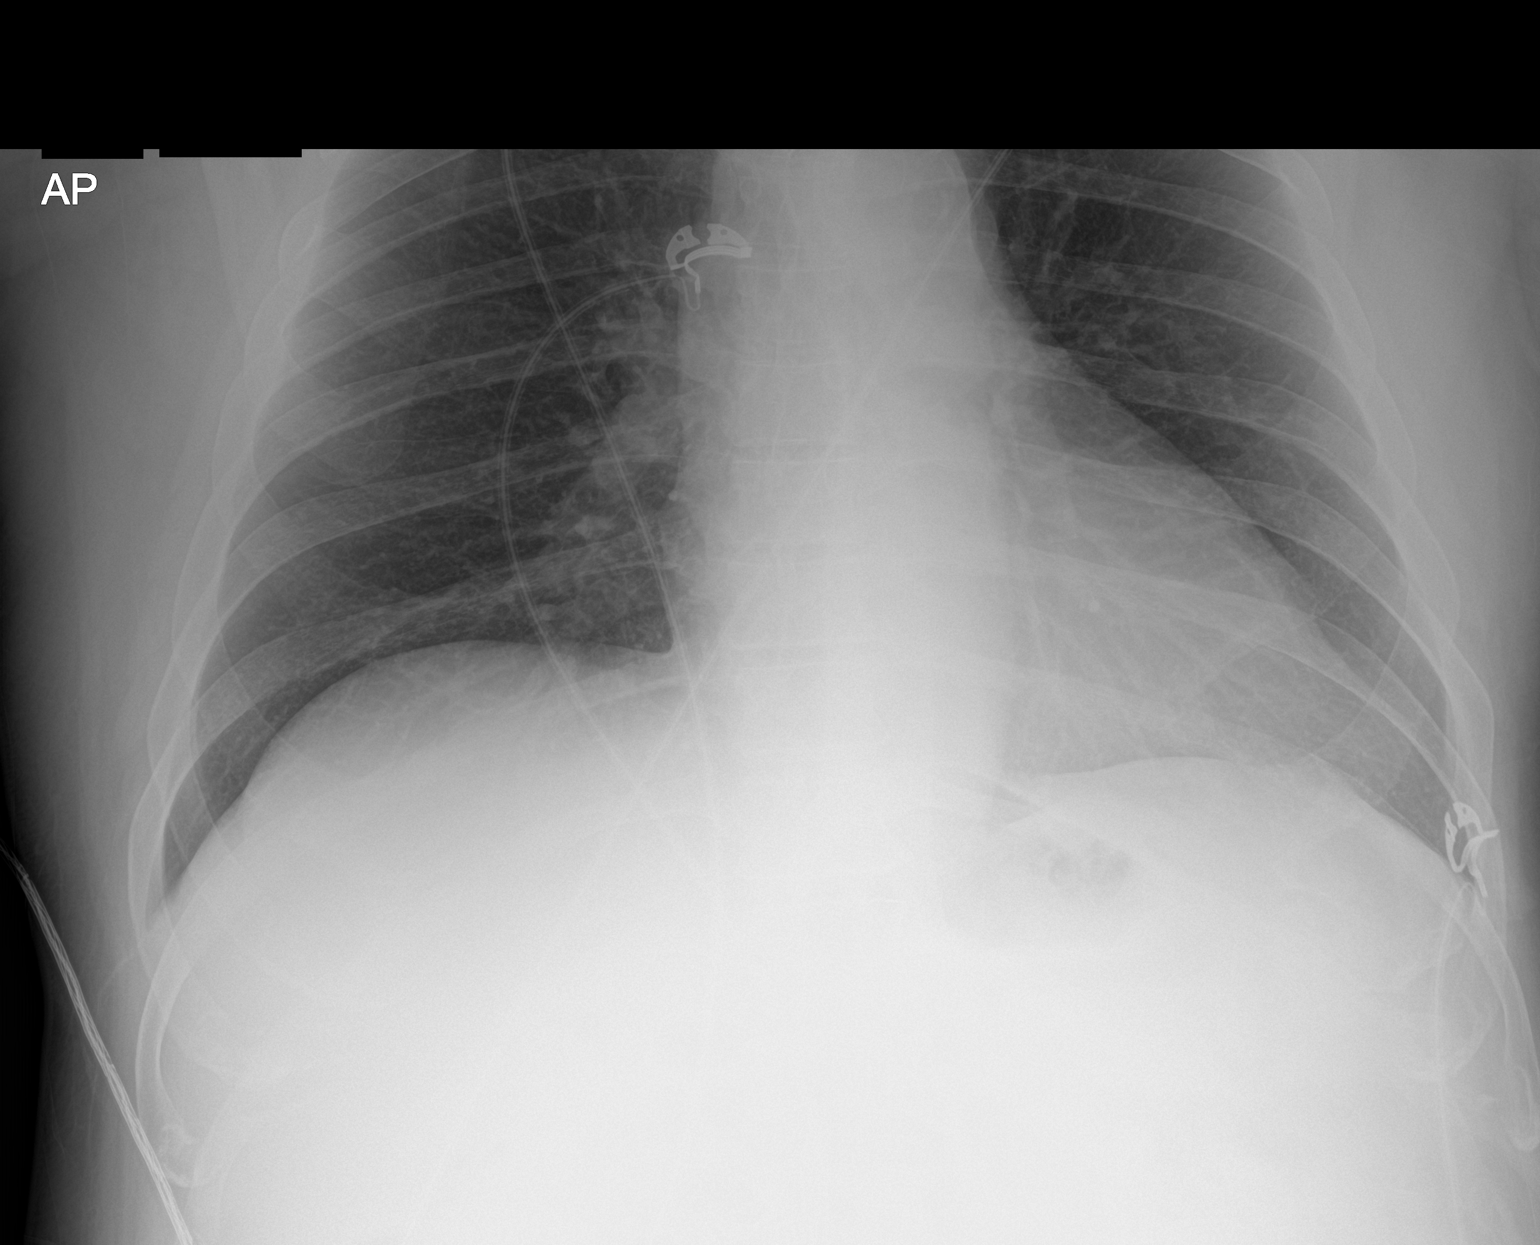

[2 of 2 positions shown; findings below may reference images not displayed]

FINDINGS: The heart size and mediastinal contours are within normal limits.
Both lungs are clear. No pleural effusion or pneumothorax. The
visualized skeletal structures are unremarkable.
IMPRESSION: No active disease.

## 2022-05-17 ENCOUNTER — Other Ambulatory Visit: Payer: Self-pay | Admitting: Orthopedic Surgery

## 2022-05-17 DIAGNOSIS — M48062 Spinal stenosis, lumbar region with neurogenic claudication: Secondary | ICD-10-CM

## 2022-05-17 DIAGNOSIS — G8929 Other chronic pain: Secondary | ICD-10-CM

## 2022-06-17 ENCOUNTER — Emergency Department (HOSPITAL_BASED_OUTPATIENT_CLINIC_OR_DEPARTMENT_OTHER): Payer: Managed Care, Other (non HMO)

## 2022-06-17 ENCOUNTER — Emergency Department (HOSPITAL_BASED_OUTPATIENT_CLINIC_OR_DEPARTMENT_OTHER)
Admission: EM | Admit: 2022-06-17 | Discharge: 2022-06-17 | Disposition: A | Discharge status: Home / Self Care | Payer: Managed Care, Other (non HMO) | Source: Home / Self Care | Attending: Emergency Medicine | Admitting: Emergency Medicine

## 2022-06-17 ENCOUNTER — Other Ambulatory Visit: Payer: Self-pay

## 2022-06-17 ENCOUNTER — Encounter (HOSPITAL_BASED_OUTPATIENT_CLINIC_OR_DEPARTMENT_OTHER): Payer: Self-pay

## 2022-06-17 DIAGNOSIS — R519 Headache, unspecified: Secondary | ICD-10-CM | POA: Insufficient documentation

## 2022-06-17 DIAGNOSIS — Z79899 Other long term (current) drug therapy: Secondary | ICD-10-CM | POA: Insufficient documentation

## 2022-06-17 DIAGNOSIS — I10 Essential (primary) hypertension: Secondary | ICD-10-CM | POA: Insufficient documentation

## 2022-06-17 DIAGNOSIS — E119 Type 2 diabetes mellitus without complications: Secondary | ICD-10-CM | POA: Insufficient documentation

## 2022-06-17 DIAGNOSIS — Z7984 Long term (current) use of oral hypoglycemic drugs: Secondary | ICD-10-CM | POA: Insufficient documentation

## 2022-06-17 DIAGNOSIS — I639 Cerebral infarction, unspecified: Secondary | ICD-10-CM | POA: Diagnosis not present

## 2022-06-17 DIAGNOSIS — I63512 Cerebral infarction due to unspecified occlusion or stenosis of left middle cerebral artery: Secondary | ICD-10-CM | POA: Diagnosis not present

## 2022-06-17 LAB — CBC WITH DIFFERENTIAL/PLATELET
Abs Immature Granulocytes: 0.04 10*3/uL (ref 0.00–0.07)
Basophils Absolute: 0.1 10*3/uL (ref 0.0–0.1)
Basophils Relative: 1 %
Eosinophils Absolute: 0.3 10*3/uL (ref 0.0–0.5)
Eosinophils Relative: 3 %
HCT: 55.1 % — ABNORMAL HIGH (ref 39.0–52.0)
Hemoglobin: 19.4 g/dL — ABNORMAL HIGH (ref 13.0–17.0)
Immature Granulocytes: 0 %
Lymphocytes Relative: 20 %
Lymphs Abs: 1.9 10*3/uL (ref 0.7–4.0)
MCH: 31.1 pg (ref 26.0–34.0)
MCHC: 35.2 g/dL (ref 30.0–36.0)
MCV: 88.3 fL (ref 80.0–100.0)
Monocytes Absolute: 0.5 10*3/uL (ref 0.1–1.0)
Monocytes Relative: 6 %
Neutro Abs: 6.5 10*3/uL (ref 1.7–7.7)
Neutrophils Relative %: 70 %
Platelets: 194 10*3/uL (ref 150–400)
RBC: 6.24 MIL/uL — ABNORMAL HIGH (ref 4.22–5.81)
RDW: 13.5 % (ref 11.5–15.5)
WBC: 9.4 10*3/uL (ref 4.0–10.5)
nRBC: 0 % (ref 0.0–0.2)

## 2022-06-17 LAB — CBG MONITORING, ED: Glucose-Capillary: 316 mg/dL — ABNORMAL HIGH (ref 70–99)

## 2022-06-17 LAB — BASIC METABOLIC PANEL
Anion gap: 10 (ref 5–15)
BUN: 9 mg/dL (ref 6–20)
CO2: 19 mmol/L — ABNORMAL LOW (ref 22–32)
Calcium: 8.9 mg/dL (ref 8.9–10.3)
Chloride: 104 mmol/L (ref 98–111)
Creatinine, Ser: 0.57 mg/dL — ABNORMAL LOW (ref 0.61–1.24)
GFR, Estimated: >60 mL/min (ref >60)
Glucose, Bld: 265 mg/dL — ABNORMAL HIGH (ref 70–99)
Potassium: 4.5 mmol/L (ref 3.5–5.1)
Sodium: 133 mmol/L — ABNORMAL LOW (ref 135–145)

## 2022-06-17 LAB — TROPONIN I (HIGH SENSITIVITY): Troponin I (High Sensitivity): 3 ng/L (ref <18)

## 2022-06-17 MED ORDER — LISINOPRIL 20 MG PO TABS: 20.0000 mg | ORAL_TABLET | Freq: Every day | ORAL | 0 refills | Status: DC

## 2022-06-17 MED ORDER — METFORMIN HCL 500 MG PO TABS: 500.0000 mg | ORAL_TABLET | Freq: Every day | ORAL | 0 refills | Status: DC

## 2022-06-17 MED ORDER — AMLODIPINE BESYLATE 5 MG PO TABS: 5.0000 mg | ORAL_TABLET | Freq: Every day | ORAL | 0 refills | Status: DC

## 2022-06-17 MED ORDER — HYDRALAZINE HCL 25 MG PO TABS
25.0000 mg | ORAL_TABLET | Freq: Once | ORAL | Status: AC
  Administered 2022-06-17: 25 mg via ORAL
  Filled 2022-06-17: qty 1

## 2022-06-17 MED ORDER — HYDROCODONE-ACETAMINOPHEN 5-325 MG PO TABS
2.0000 | ORAL_TABLET | Freq: Once | ORAL | Status: AC
  Administered 2022-06-17: 2 via ORAL
  Filled 2022-06-17: qty 2

## 2022-06-17 NOTE — Discharge Instructions (Signed)
I have refilled your prescriptions.  Please take amlodipine, lisinopril, and metformin daily.  I understand that the metformin upsets her stomach.  I would drop it down from taking it twice daily to just once daily with food.  This is going to help your blood sugars stabilize.  Please return to the emerged part for any worsening symptoms. I have also given you our primary care doctor service if you need one.

## 2022-06-17 NOTE — ED Notes (Signed)
Patient in radiology

## 2022-06-17 NOTE — ED Provider Notes (Signed)
[facility] EMERGENCY DEPARTMENT Provider Note   CSN: [id number] Arrival date & time: 06/17/22  1544     History Chief Complaint  Patient presents with   Hypertension    Theodore Hinton is a 56 y.o. male with history of hypertension and diabetes who presents to the emergency department with elevated blood pressures and elevated blood sugars been ongoing for the last week or 2.  Patient was at his doctor's office getting ready to receive some injections in his back secondary to chronic back pain when they took his blood pressure which was in the 200s systolic and had blood sugars in the 300-400s.  He was sent here for further evaluation.  Patient said he has been out of his blood pressure medications and diabetes medications for the last 2 weeks.  He states he does not take his metformin because it upsets his stomach.  He reports associated chest pain, headache, dizziness characterizes spinning room sensation.  He denies any focal weakness or numbness.  No abdominal pain, nausea, vomiting.   Hypertension       Home Medications Prior to Admission medications   Medication Sig Start Date End Date Taking? Authorizing Provider  amLODipine (NORVASC) 5 MG tablet Take 1 tablet (5 mg total) by mouth daily. 06/17/22  Yes Meredeth Ide, Letti Towell M, PA-C  lisinopril (ZESTRIL) 20 MG tablet Take 1 tablet (20 mg total) by mouth daily. 06/17/22  Yes Meredeth Ide, Varvara Legault M, PA-C  metFORMIN (GLUCOPHAGE) 500 MG tablet Take 1 tablet (500 mg total) by mouth daily. 06/17/22  Yes Meredeth Ide, Delano Scardino M, PA-C  amLODipine (NORVASC) 5 MG tablet Take 1 tablet (5 mg total) by mouth daily. 01/06/22   Derwood Kaplan, MD  diclofenac Sodium (VOLTAREN) 1 % GEL Apply 2 g topically 4 (four) times daily as needed. 03/07/21   Long, Arlyss Repress, MD  esomeprazole (NEXIUM) 40 MG capsule Take 1 capsule (40 mg total) by mouth daily. 12/01/20   Charlynne Pander, MD  gabapentin (NEURONTIN) 300 MG capsule Take by mouth. 02/21/17   [provider]  lisinopril (PRINIVIL,ZESTRIL) 20 MG tablet Take 1 tablet (20 mg total) by mouth daily. 07/19/16   Bethann Berkshire, MD  metFORMIN (GLUCOPHAGE) 500 MG tablet Take 1 tablet (500 mg total) by mouth 2 (two) times daily with a meal. 12/01/20   Charlynne Pander, MD  methocarbamol (ROBAXIN) 500 MG tablet Take 1 tablet (500 mg total) by mouth 2 (two) times daily. 01/06/22   Derwood Kaplan, MD  naproxen (NAPROSYN) 500 MG tablet Take 1 tablet (500 mg total) by mouth 2 (two) times daily. 01/06/22   Derwood Kaplan, MD  ondansetron (ZOFRAN ODT) 4 MG disintegrating tablet 4mg  ODT q4 hours prn nausea/vomit 12/01/20   12/03/20, MD  ondansetron (ZOFRAN) 4 MG tablet Take 1 tablet (4 mg total) by mouth every 8 (eight) hours as needed for nausea or vomiting. 07/20/16   07/22/16, DO  oxyCODONE-acetaminophen (PERCOCET/ROXICET) 5-325 MG tablet Take 1 tablet by mouth every 6 (six) hours as needed for severe pain. 02/28/22   03/02/22, PA-C  predniSONE (DELTASONE) 10 MG tablet Take 5 tablets (50 mg total) by mouth daily. 01/06/22   03/06/22, MD      Allergies    Patient has no known allergies.    Review of Systems   Review of Systems  All other systems reviewed and are negative.   Physical Exam Updated Vital Signs BP (!) 141/82   Pulse 74   Temp 98.5 F (  36.9 C)   Resp 15   Ht 5\' 8"  (1.727 m)   Wt 77.1 kg   SpO2 98%   BMI 25.85 kg/m  Physical Exam Vitals and nursing note reviewed.  Constitutional:      General: He is not in acute distress.    Appearance: Normal appearance.  HENT:     Head: Normocephalic and atraumatic.  Eyes:     General:        Right eye: No discharge.        Left eye: No discharge.  Cardiovascular:     Comments: Regular rate and rhythm.  S1/S2 are distinct without any evidence of murmur, rubs, or gallops.  Radial pulses are 2+ bilaterally.  Dorsalis pedis pulses are 2+ bilaterally.  No evidence of pedal edema. Pulmonary:     Comments: Clear  to auscultation bilaterally.  Normal effort.  No respiratory distress.  No evidence of wheezes, rales, or rhonchi heard throughout. Abdominal:     General: Abdomen is flat. Bowel sounds are normal. There is no distension.     Tenderness: There is no abdominal tenderness. There is no guarding or rebound.  Musculoskeletal:        General: Normal range of motion.     Cervical back: Neck supple.  Skin:    General: Skin is warm and dry.     Findings: No rash.  Neurological:     General: No focal deficit present.     Mental Status: He is alert and oriented to person, place, and time.     Comments: Cranial nerves II through XII are intact.  5/5 strength to the upper and lower extremities.  Normal sensation to the upper and lower extremities.  No dysmetria to finger-nose.  Psychiatric:        Mood and Affect: Mood normal.        Behavior: Behavior normal.     ED Results / Procedures / Treatments   Labs (all labs ordered are listed, but only abnormal results are displayed) Labs Reviewed  CBC WITH DIFFERENTIAL/PLATELET - Abnormal; Notable for the following components:      Result Value   RBC 6.24 (*)    Hemoglobin 19.4 (*)    HCT 55.1 (*)    All other components within normal limits  BASIC METABOLIC PANEL - Abnormal; Notable for the following components:   Sodium 133 (*)    CO2 19 (*)    Glucose, Bld 265 (*)    Creatinine, Ser 0.57 (*)    All other components within normal limits  CBG MONITORING, ED - Abnormal; Notable for the following components:   Glucose-Capillary 316 (*)    All other components within normal limits  TROPONIN I (HIGH SENSITIVITY)    EKG EKG Interpretation  Date/Time:  Friday June 17 2022 16:03:58 EDT Ventricular Rate:  75 PR Interval:  176 QRS Duration: 87 QT Interval:  412 QTC Calculation: 461 R Axis:   -56 Text Interpretation: Sinus rhythm No significant change since Confirmed by [id number] ([id number]) on 06/17/2022 4:06:03 PM  Radiology CT Head Wo  Contrast  Result Date: 06/17/2022 CLINICAL DATA:  Headache. EXAM: CT HEAD WITHOUT CONTRAST TECHNIQUE: Contiguous axial images were obtained from the base of the skull through the vertex without intravenous contrast. RADIATION DOSE REDUCTION: This exam was performed according to the departmental dose-optimization program which includes automated exposure control, adjustment of the mA and/or kV according to patient size and/or use of iterative reconstruction technique. COMPARISON:  February 28, 2015  FINDINGS: Brain: No evidence of acute infarction, hemorrhage, hydrocephalus, extra-axial collection or mass lesion/mass effect. Vascular: Calcified atherosclerotic changes are seen in the intracranial carotids. Skull: Normal. Negative for fracture or focal lesion. Sinuses/Orbits: Opacification most of the ethmoid air cells. Fluid identified in the sphenoid sinuses. Mucosal thickening identified in the maxillary sinuses. There is a small amount of fluid in the inferior right mastoid air cells without bony erosion. There may be some bony sclerosis in this region suggesting this may be a nonacute finding. Other: None. IMPRESSION: 1. Sinus disease as above. There is also small amount of fluid in the right mastoid air cells without bony erosion or overlying soft tissue inflammation. 2. No acute intracranial abnormalities. Electronically Signed   By: Gerome Sam III M.D.   On: 06/17/2022 17:06   DG Chest Portable 1 View  Result Date: 06/17/2022 CLINICAL DATA:  nausea and dizziness starting today. Diab and htn. Hx mini strokes. chest pain EXAM: PORTABLE CHEST 1 VIEW COMPARISON:  None Available. FINDINGS: Normal mediastinum and cardiac silhouette. Normal pulmonary vasculature. No evidence of effusion, infiltrate, or pneumothorax. No acute bony abnormality. IMPRESSION: No acute cardiopulmonary process. Electronically Signed   By: Genevive Bi M.D.   On: 06/17/2022 16:43    Procedures Procedures    Medications  Ordered in ED Medications  hydrALAZINE (APRESOLINE) tablet 25 mg (25 mg Oral Given 06/17/22 1704)  HYDROcodone-acetaminophen (NORCO/VICODIN) 5-325 MG per tablet 2 tablet (2 tablets Oral Given 06/17/22 1704)    ED Course/ Medical Decision Making/ A&P Clinical Course as of 06/17/22 1910  Fri Jun 17, 2022  1813 CBC with Differential(!) Leukocytosis negative.  Elevated RBC and hemoglobin. [CF]  1814 Basic metabolic panel(!) Mild hyponatremia.  Elevated glucose in the setting of diabetes. [CF]  1814 Troponin I (High Sensitivity) Initial troponin is negative. [CF]  1814 DG Chest Portable 1 View I personally interpreted this image.  No signs of pneumonia or cardiomegaly.  I do agree with the radiologist interpretation. [CF]  1815 CT Head Wo Contrast I personally interpreted this study.  I do not see any evidence of intracranial hemorrhage today.  I do agree with the radiologist interpretation. [CF]    Clinical Course User Index [CF] Teressa Lower, PA-C                           Medical Decision Making Theodore Hinton is a 56 y.o. male patient who presents to the emergency department for further evaluation of elevated blood pressure and elevated blood sugar.  Patient having blood pressures that meet criteria for hypertensive urgency versus emergency.  Patient is symptomatic.  We will get cardiac enzymes in addition to a CT head for further evaluation.  Neurologically he was intact.  Patient also complaining of a lot of chronic back pain which could be contributory to his elevated blood pressure.  We will also give him pain medication.  I will plan to reassess.  All of his labs are reassuring.  Blood pressure continues to normalize after 25 of hydralazine.  Second troponin is pending.  EKG does not show any significant signs of active ischemia. Initial troponin is negative. I have given him primary care follow-up and refilled his medications for the next month for his blood pressure and diabetes.  He does appear clinically dehydrated. He is feeling better. Will plan to discharge.   Amount and/or Complexity of Data Reviewed Labs: ordered. Decision-making details documented in ED Course. Radiology: ordered. Decision-making details  documented in ED Course.  Risk Prescription drug management.   Final Clinical Impression(s) / ED Diagnoses Final diagnoses:  Hypertension, unspecified type    Rx / DC Orders ED Discharge Orders          Ordered    amLODipine (NORVASC) 5 MG tablet  Daily        06/17/22 1850    lisinopril (ZESTRIL) 20 MG tablet  Daily        06/17/22 1850    metFORMIN (GLUCOPHAGE) 500 MG tablet  Daily        06/17/22 1850              Teressa Lower, [place] 06/17/22 1911    Virgina Norfolk, DO 06/17/22 1932

## 2022-06-17 NOTE — ED Triage Notes (Signed)
Patient states he was supposed to have a back procedure yesterday but they would not do it due to high blood pressure and blood sugar. Was told to follow up with PCP, but patient is in the middle of changing MD. Patient c/o nausea and dizziness starting today

## 2022-06-18 ENCOUNTER — Emergency Department (HOSPITAL_BASED_OUTPATIENT_CLINIC_OR_DEPARTMENT_OTHER): Payer: Commercial Managed Care - HMO

## 2022-06-18 ENCOUNTER — Inpatient Hospital Stay (HOSPITAL_BASED_OUTPATIENT_CLINIC_OR_DEPARTMENT_OTHER)
Admission: EM | Admit: 2022-06-18 | Discharge: 2022-06-20 | DRG: 062 | Disposition: A | Discharge status: Home / Self Care | Payer: Self-pay | Attending: Neurology | Admitting: Neurology

## 2022-06-18 ENCOUNTER — Encounter (HOSPITAL_BASED_OUTPATIENT_CLINIC_OR_DEPARTMENT_OTHER): Payer: Self-pay | Admitting: Emergency Medicine

## 2022-06-18 ENCOUNTER — Other Ambulatory Visit: Payer: Self-pay

## 2022-06-18 DIAGNOSIS — I6389 Other cerebral infarction: Secondary | ICD-10-CM | POA: Diagnosis not present

## 2022-06-18 DIAGNOSIS — Z7952 Long term (current) use of systemic steroids: Secondary | ICD-10-CM | POA: Diagnosis not present

## 2022-06-18 DIAGNOSIS — I10 Essential (primary) hypertension: Secondary | ICD-10-CM | POA: Diagnosis present

## 2022-06-18 DIAGNOSIS — R4781 Slurred speech: Secondary | ICD-10-CM | POA: Diagnosis present

## 2022-06-18 DIAGNOSIS — E1165 Type 2 diabetes mellitus with hyperglycemia: Secondary | ICD-10-CM | POA: Diagnosis present

## 2022-06-18 DIAGNOSIS — R29704 NIHSS score 4: Secondary | ICD-10-CM | POA: Diagnosis present

## 2022-06-18 DIAGNOSIS — I63512 Cerebral infarction due to unspecified occlusion or stenosis of left middle cerebral artery: Principal | ICD-10-CM | POA: Diagnosis present

## 2022-06-18 DIAGNOSIS — R29810 Facial weakness: Secondary | ICD-10-CM | POA: Diagnosis present

## 2022-06-18 DIAGNOSIS — E871 Hypo-osmolality and hyponatremia: Secondary | ICD-10-CM | POA: Diagnosis present

## 2022-06-18 DIAGNOSIS — G8191 Hemiplegia, unspecified affecting right dominant side: Secondary | ICD-10-CM | POA: Diagnosis present

## 2022-06-18 DIAGNOSIS — Z720 Tobacco use: Secondary | ICD-10-CM | POA: Diagnosis not present

## 2022-06-18 DIAGNOSIS — M549 Dorsalgia, unspecified: Secondary | ICD-10-CM | POA: Diagnosis present

## 2022-06-18 DIAGNOSIS — Z20822 Contact with and (suspected) exposure to covid-19: Secondary | ICD-10-CM | POA: Diagnosis present

## 2022-06-18 DIAGNOSIS — I639 Cerebral infarction, unspecified: Secondary | ICD-10-CM | POA: Diagnosis present

## 2022-06-18 DIAGNOSIS — Z91148 Patient's other noncompliance with medication regimen for other reason: Secondary | ICD-10-CM

## 2022-06-18 DIAGNOSIS — E785 Hyperlipidemia, unspecified: Secondary | ICD-10-CM | POA: Diagnosis present

## 2022-06-18 DIAGNOSIS — Z79899 Other long term (current) drug therapy: Secondary | ICD-10-CM | POA: Diagnosis not present

## 2022-06-18 DIAGNOSIS — G8929 Other chronic pain: Secondary | ICD-10-CM | POA: Diagnosis present

## 2022-06-18 LAB — CBC WITH DIFFERENTIAL/PLATELET
Abs Immature Granulocytes: 0.06 10*3/uL (ref 0.00–0.07)
Basophils Absolute: 0.1 10*3/uL (ref 0.0–0.1)
Basophils Relative: 1 %
Eosinophils Absolute: 0.2 10*3/uL (ref 0.0–0.5)
Eosinophils Relative: 2 %
HCT: 53.8 % — ABNORMAL HIGH (ref 39.0–52.0)
Hemoglobin: 19.2 g/dL — ABNORMAL HIGH (ref 13.0–17.0)
Immature Granulocytes: 1 %
Lymphocytes Relative: 18 %
Lymphs Abs: 2.1 10*3/uL (ref 0.7–4.0)
MCH: 31.7 pg (ref 26.0–34.0)
MCHC: 35.7 g/dL (ref 30.0–36.0)
MCV: 88.8 fL (ref 80.0–100.0)
Monocytes Absolute: 0.7 10*3/uL (ref 0.1–1.0)
Monocytes Relative: 7 %
Neutro Abs: 8.3 10*3/uL — ABNORMAL HIGH (ref 1.7–7.7)
Neutrophils Relative %: 71 %
Platelets: 235 10*3/uL (ref 150–400)
RBC: 6.06 MIL/uL — ABNORMAL HIGH (ref 4.22–5.81)
RDW: 13.7 % (ref 11.5–15.5)
WBC: 11.5 10*3/uL — ABNORMAL HIGH (ref 4.0–10.5)
nRBC: 0 % (ref 0.0–0.2)

## 2022-06-18 LAB — URINALYSIS, ROUTINE W REFLEX MICROSCOPIC
Bilirubin Urine: NEGATIVE
Glucose, UA: >=500 mg/dL — AB
Hgb urine dipstick: NEGATIVE
Ketones, ur: 15 mg/dL — AB
Leukocytes,Ua: NEGATIVE
Nitrite: NEGATIVE
Protein, ur: NEGATIVE mg/dL
Specific Gravity, Urine: 1.01 (ref 1.005–1.030)
pH: 6.5 (ref 5.0–8.0)

## 2022-06-18 LAB — I-STAT VENOUS BLOOD GAS, ED
Acid-base deficit: 5 mmol/L — ABNORMAL HIGH (ref 0.0–2.0)
Bicarbonate: 18.3 mmol/L — ABNORMAL LOW (ref 20.0–28.0)
Calcium, Ion: 1.11 mmol/L — ABNORMAL LOW (ref 1.15–1.40)
HCT: 61 % — ABNORMAL HIGH (ref 39.0–52.0)
Hemoglobin: 20.7 g/dL — ABNORMAL HIGH (ref 13.0–17.0)
O2 Saturation: 79 %
Potassium: 3.7 mmol/L (ref 3.5–5.1)
Sodium: 135 mmol/L (ref 135–145)
TCO2: 19 mmol/L — ABNORMAL LOW (ref 22–32)
pCO2, Ven: 31.8 mmHg — ABNORMAL LOW (ref 44–60)
pH, Ven: 7.368 (ref 7.25–7.43)
pO2, Ven: 44 mmHg (ref 32–45)

## 2022-06-18 LAB — URINALYSIS, MICROSCOPIC (REFLEX)

## 2022-06-18 LAB — COMPREHENSIVE METABOLIC PANEL
ALT: 27 U/L (ref 0–44)
AST: 38 U/L (ref 15–41)
Albumin: 4 g/dL (ref 3.5–5.0)
Alkaline Phosphatase: 72 U/L (ref 38–126)
Anion gap: 16 — ABNORMAL HIGH (ref 5–15)
BUN: 13 mg/dL (ref 6–20)
CO2: 18 mmol/L — ABNORMAL LOW (ref 22–32)
Calcium: 9.2 mg/dL (ref 8.9–10.3)
Chloride: 99 mmol/L (ref 98–111)
Creatinine, Ser: 1.15 mg/dL (ref 0.61–1.24)
GFR, Estimated: >60 mL/min (ref >60)
Glucose, Bld: 430 mg/dL — ABNORMAL HIGH (ref 70–99)
Potassium: 3.8 mmol/L (ref 3.5–5.1)
Sodium: 133 mmol/L — ABNORMAL LOW (ref 135–145)
Total Bilirubin: 2.2 mg/dL — ABNORMAL HIGH (ref 0.3–1.2)
Total Protein: 7.6 g/dL (ref 6.5–8.1)

## 2022-06-18 LAB — PROTIME-INR
INR: 1 (ref 0.8–1.2)
Prothrombin Time: 13.5 seconds (ref 11.4–15.2)

## 2022-06-18 LAB — RESP PANEL BY RT-PCR (FLU A&B, COVID) ARPGX2
Influenza A by PCR: NEGATIVE
Influenza B by PCR: NEGATIVE
SARS Coronavirus 2 by RT PCR: NEGATIVE

## 2022-06-18 LAB — RAPID URINE DRUG SCREEN, HOSP PERFORMED
Amphetamines: NOT DETECTED
Barbiturates: NOT DETECTED
Benzodiazepines: NOT DETECTED
Cocaine: NOT DETECTED
Opiates: POSITIVE — AB
Tetrahydrocannabinol: NOT DETECTED

## 2022-06-18 LAB — CBG MONITORING, ED
Glucose-Capillary: 410 mg/dL — ABNORMAL HIGH (ref 70–99)
Glucose-Capillary: 417 mg/dL — ABNORMAL HIGH (ref 70–99)

## 2022-06-18 LAB — ETHANOL: Alcohol, Ethyl (B): 18 mg/dL — ABNORMAL HIGH (ref <10)

## 2022-06-18 LAB — APTT: aPTT: 20 seconds — ABNORMAL LOW (ref 24–36)

## 2022-06-18 LAB — MRSA NEXT GEN BY PCR, NASAL: MRSA by PCR Next Gen: NOT DETECTED

## 2022-06-18 MED ORDER — SODIUM CHLORIDE 0.9 % IV BOLUS
1000.0000 mL | Freq: Once | INTRAVENOUS | Status: AC
  Administered 2022-06-18: 1000 mL via INTRAVENOUS

## 2022-06-18 MED ORDER — CHLORHEXIDINE GLUCONATE CLOTH 2 % EX PADS
6.0000 | MEDICATED_PAD | Freq: Every day | CUTANEOUS | Status: DC
  Administered 2022-06-18 – 2022-06-20 (×2): 6 via TOPICAL

## 2022-06-18 MED ORDER — IOHEXOL 350 MG/ML SOLN
75.0000 mL | Freq: Once | INTRAVENOUS | Status: AC | PRN
  Administered 2022-06-18: 75 mL via INTRAVENOUS

## 2022-06-18 MED ORDER — TENECTEPLASE FOR STROKE
0.2500 mg/kg | PACK | Freq: Once | INTRAVENOUS | Status: AC
Start: 2022-06-18 — End: 2022-06-18

## 2022-06-18 MED ORDER — SODIUM CHLORIDE 0.9 % IV SOLN: INTRAVENOUS | Status: DC

## 2022-06-18 MED ORDER — LABETALOL HCL 5 MG/ML IV SOLN: 10.0000 mg | INTRAVENOUS | Status: DC | PRN

## 2022-06-18 MED ORDER — ORAL CARE MOUTH RINSE: 15.0000 mL | OROMUCOSAL | Status: DC | PRN

## 2022-06-18 MED ORDER — ACETAMINOPHEN 160 MG/5ML PO SOLN: 650.0000 mg | ORAL | Status: DC | PRN

## 2022-06-18 MED ORDER — PANTOPRAZOLE SODIUM 40 MG IV SOLR
40.0000 mg | Freq: Every day | INTRAVENOUS | Status: DC
  Administered 2022-06-18 – 2022-06-19 (×2): 40 mg via INTRAVENOUS
  Filled 2022-06-18 (×2): qty 10

## 2022-06-18 MED ORDER — STROKE: EARLY STAGES OF RECOVERY BOOK
Freq: Once | Status: AC
  Filled 2022-06-18: qty 1

## 2022-06-18 MED ORDER — INSULIN ASPART 100 UNIT/ML IJ SOLN
0.0000 [IU] | INTRAMUSCULAR | Status: DC
  Administered 2022-06-19: 2 [IU] via SUBCUTANEOUS
  Administered 2022-06-19: 5 [IU] via SUBCUTANEOUS
  Administered 2022-06-19: 2 [IU] via SUBCUTANEOUS
  Administered 2022-06-19 (×2): 8 [IU] via SUBCUTANEOUS
  Administered 2022-06-19: 5 [IU] via SUBCUTANEOUS
  Administered 2022-06-20 (×2): 3 [IU] via SUBCUTANEOUS
  Administered 2022-06-20: 5 [IU] via SUBCUTANEOUS
  Administered 2022-06-20: 2 [IU] via SUBCUTANEOUS

## 2022-06-18 MED ORDER — ACETAMINOPHEN 650 MG RE SUPP: 650.0000 mg | RECTAL | Status: DC | PRN

## 2022-06-18 MED ORDER — TENECTEPLASE FOR STROKE
PACK | INTRAVENOUS | Status: AC
  Administered 2022-06-18: 20 mg via INTRAVENOUS
  Filled 2022-06-18: qty 10

## 2022-06-18 MED ORDER — ACETAMINOPHEN 325 MG PO TABS
650.0000 mg | ORAL_TABLET | ORAL | Status: DC | PRN
  Administered 2022-06-18: 650 mg via ORAL
  Filled 2022-06-18: qty 2

## 2022-06-18 NOTE — Consult Note (Signed)
[facility] TeleNeurology Consult Services    Date of Service:  06/18/2022   Impression: Acute onset of slurred speech and facial droop    Metrics: Last Known Well: 1550 Symptoms: As per HPI.  Patient is a candidate for thrombolytic.   Location of the provider: [facility]  Location of the patient: [facility] Pre-Morbid Modified Rankin Scale: 0 Time Code Stroke Page received: 4:43 PM Time neurologist arrived:  4:53 PM Time NIHSS completed: 5:15 PM  Modified Rankin score: 0   This consult was provided via telemedicine with 2-way video and audio communication. The patient/family was informed that care would be provided in this way and agreed to receive care in this manner.   ED Physician notified of diagnostic impression and management plan at: 5:22 PM   Assessment: 56 year old male with acute onset of right facial droop, right facial numbness, slurred speech and right arm weakness with unsteady gait.   - Exam reveals findings best localizable to the left cerebral hemisphere deep grey nuclei or internal capsule.  - CT head: No acute intracranial abnormality or significant interval change. Stable mild white matter disease. ASPECTS is 10/10. - Stroke risk factors: Prior stroke, DM and HTN - After comprehensive review of possible contraindications, he has no absolute contraindications to TNK administration. - Patient is an IV thrombolysis candidate. Discussed extensively the risks/benefits of IV thrombolysis treatment vs. no treatment with the patient, including risks of hemorrhage and death with IV thrombolysis administration versus worse overall outcomes on average in patients within thrombolysis time window who are not administered TNK. Overall benefits of TNK regarding long-term prognosis are felt to outweigh risks. The patient expressed understanding and wish to proceed with TNK.    Recommendations: - Administering TNK. - CTA of head and neck  while at [facility] - Transfer to [facility] for admission to the Neuro ICU under the Neurology service - Post-TNK order set to include frequent neuro checks and BP management.  - No antiplatelet medications or anticoagulants for at least 24 hours following TNK. Can only administer if CT head at 24 hours is negative for hemorrhagic conversion.  - DVT prophylaxis with SCDs.  - Will need to be started on a statin.  - TTE.  - MRI brain  - PT/OT/Speech.  - NPO until passes swallow evaluation.  - Sliding scale insulin.  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  He sates that he also could not walk straight. His headache is characterized as a pressure-like pain along the right supraorbital region, right forehead and right temple. LKN was 1550, which was also the time his symptoms were first noticed. BP on arrival to the ED was 125/83 which then decreased to 97/68. Code Stroke was called in the ED.     Past Medical History: Past Medical History:  Diagnosis Date   Back injury    Chronic back pain    Diabetes mellitus without complication (HCC)    Diverticulosis    Hypertension      Past Surgical History: History reviewed.  No pertinent surgical history.    Medications:  No current facility-administered medications on file prior to encounter.   Current Outpatient Medications on File Prior to Encounter  Medication Sig Dispense Refill   amLODipine (NORVASC) 5 MG tablet Take 1 tablet (5 mg total) by mouth daily. 30 tablet  1   amLODipine (NORVASC) 5 MG tablet Take 1 tablet (5 mg total) by mouth daily. 30 tablet 0   diclofenac Sodium (VOLTAREN) 1 % GEL Apply 2 g topically 4 (four) times daily as needed. 50 g 0   esomeprazole (NEXIUM) 40 MG capsule Take 1 capsule (40 mg total) by mouth daily. 30 capsule 0   gabapentin (NEURONTIN) 300 MG capsule Take by mouth.     lisinopril (PRINIVIL,ZESTRIL) 20 MG tablet Take 1 tablet (20 mg total) by mouth daily. 30 tablet 1   lisinopril (ZESTRIL) 20 MG tablet Take 1 tablet (20 mg total) by mouth daily. 30 tablet 0   metFORMIN (GLUCOPHAGE) 500 MG tablet Take 1 tablet (500 mg total) by mouth 2 (two) times daily with a meal. 60 tablet 0   metFORMIN (GLUCOPHAGE) 500 MG tablet Take 1 tablet (500 mg total) by mouth daily. 30 tablet 0   methocarbamol (ROBAXIN) 500 MG tablet Take 1 tablet (500 mg total) by mouth 2 (two) times daily. 10 tablet 0   naproxen (NAPROSYN) 500 MG tablet Take 1 tablet (500 mg total) by mouth 2 (two) times daily. 20 tablet 0   ondansetron (ZOFRAN ODT) 4 MG disintegrating tablet 4mg  ODT q4 hours prn nausea/vomit 10 tablet 0   ondansetron (ZOFRAN) 4 MG tablet Take 1 tablet (4 mg total) by mouth every 8 (eight) hours as needed for nausea or vomiting. 6 tablet 0   oxyCODONE-acetaminophen (PERCOCET/ROXICET) 5-325 MG tablet Take 1 tablet by mouth every 6 (six) hours as needed for severe pain. 6 tablet 0   predniSONE (DELTASONE) 10 MG tablet Take 5 tablets (50 mg total) by mouth daily. 25 tablet 0         Social History: Drug Use: No No current EtOH use Never smoker Uses smokeless tobacco  Family History:  Reviewed in Epic   ROS: As per HPI    Anticoagulant use:  No   Antiplatelet use: No   Examination:   BP 119/77   Pulse 92   Temp 98.3 F (36.8 C) (Oral)   Resp 19   Wt 80.6 kg   SpO2 94%   BMI 27.02 kg/m      1A: Level of Consciousness - 0 1B: Ask Month and Age - 0 1C: Blink Eyes & Squeeze Hands - 0 2: Test Horizontal Extraocular Movements  - 0 3: Test Visual Fields - 0 4: Test Facial Palsy (Use Grimace if Obtunded) - 1 5A: Test Left Arm Motor Drift - 0 5B: Test Right Arm Motor Drift - 0 (but with weakness against resistance) 6A: Test Left Leg Motor Drift - 0 6B: Test Right Leg Motor Drift - 0 (but with weakness against resistance) 7: Test Limb Ataxia (FNF/Heel-Shin) - 0 8: Test Sensation -  1 9: Test Language/Aphasia - 0 10: Test Dysarthria - Severe Dysarthria: 1 11: Test Extinction/Inattention - Extinction to bilateral simultaneous stimulation 0   NIHSS Score: 3     Patient/Family was informed the Neurology Consult would occur via TeleHealth consult by way of interactive audio and video telecommunications and consented to receiving care in this manner.   Patient is being evaluated for possible acute neurologic impairment and high pretest  probability of imminent or life-threatening deterioration. I spent total of 40 minutes providing care to this patient, including time for face to face visit via telemedicine, review of medical records, imaging studies and discussion of findings with providers, the patient and/or family.   Electronically signed: Dr. Caryl Pina

## 2022-06-18 NOTE — ED Notes (Signed)
Patient transported to CT 

## 2022-06-18 NOTE — ED Notes (Signed)
1728hrs - 20mg  (63ml) TNK given IV push

## 2022-06-18 NOTE — H&P (Signed)
Neurology H&P  CC: Right-sided weakness  History is obtained from: Patient  HPI: Theodore Hinton is a 56 y.o. male who reports that around 1550 he had onset of right-sided facial droop right-sided facial numbness, slurred speech and right arm weakness.  He says that he felt lightheaded, and saw spots similar to what happens if you stand up too fast.  He then noticed that there was a numbing/tingling feeling of his right side.  Due to the symptoms he sought care at [facility] on their was evaluated by Dr. Otelia Limes who recommended IV tenecteplase which was given.  He states that since that time, he has had some improvement but still is not back to baseline.  Also of note, while doing review of systems the patient mentions that he had some abdominal pain this morning, this is resolved at this point, but he has had it before and is concerned about it.  LKW: 1550 TNK given?:  Yes IR Thrombectomy? No, no LVO Modified Rankin Scale: 0-Completely asymptomatic and back to baseline post- stroke NIHSS score: 4 1A: Level of Consciousness - 0 1B: Ask Month and Age - 0 1C: 'Blink Eyes' & 'Squeeze Hands' - 0 2: Test Horizontal Extraocular Movements - 0 3: Test Visual Fields - 0 4: Test Facial Palsy - 1 5A: Test Left Arm Motor Drift - 0 5B: Test Right Arm Motor Drift - 1 6A: Test Left Leg Motor Drift - 0 6B: Test Right Leg Motor Drift - 1 7: Test Limb Ataxia - 0 8: Test Sensation - 1 9: Test Language/Aphasia- 0 10: Test Dysarthria - 0 11: Test Extinction/Inattention - 0  Past Medical History:  Diagnosis Date   Back injury    Chronic back pain    Diabetes mellitus without complication (HCC)    Diverticulosis    Hypertension      History reviewed. No pertinent family history.   Social History:  reports that he has never smoked. His smokeless tobacco use includes chew. He reports that he does not currently use alcohol. He reports that he does not use drugs.   Prior to  Admission medications   Medication Sig Start Date End Date Taking? Authorizing Provider  amLODipine (NORVASC) 5 MG tablet Take 1 tablet (5 mg total) by mouth daily. 01/06/22   Derwood Kaplan, MD  amLODipine (NORVASC) 5 MG tablet Take 1 tablet (5 mg total) by mouth daily. 06/17/22   Curatolo, Adam, DO  diclofenac Sodium (VOLTAREN) 1 % GEL Apply 2 g topically 4 (four) times daily as needed. 03/07/21   Long, Arlyss Repress, MD  esomeprazole (NEXIUM) 40 MG capsule Take 1 capsule (40 mg total) by mouth daily. 12/01/20   Charlynne Pander, MD  gabapentin (NEURONTIN) 300 MG capsule Take by mouth. 02/21/17   [provider]  lisinopril (PRINIVIL,ZESTRIL) 20 MG tablet Take 1 tablet (20 mg total) by mouth daily. 07/19/16   Bethann Berkshire, MD  lisinopril (ZESTRIL) 20 MG tablet Take 1 tablet (20 mg total) by mouth daily. 06/17/22   Curatolo, Adam, DO  metFORMIN (GLUCOPHAGE) 500 MG tablet Take 1 tablet (500 mg total) by mouth 2 (two) times daily with a meal. 12/01/20   Charlynne Pander, MD  metFORMIN (GLUCOPHAGE) 500 MG tablet Take 1 tablet (500 mg total) by mouth daily. 06/17/22   Curatolo, Adam, DO  methocarbamol (ROBAXIN) 500 MG tablet Take 1 tablet (500 mg total) by mouth 2 (two) times daily. 01/06/22   Derwood Kaplan, MD  naproxen (NAPROSYN) 500  MG tablet Take 1 tablet (500 mg total) by mouth 2 (two) times daily. 01/06/22   Derwood Kaplan, MD  ondansetron (ZOFRAN ODT) 4 MG disintegrating tablet 4mg  ODT q4 hours prn nausea/vomit 12/01/20   12/03/20, MD  ondansetron (ZOFRAN) 4 MG tablet Take 1 tablet (4 mg total) by mouth every 8 (eight) hours as needed for nausea or vomiting. 07/20/16   07/22/16, DO  oxyCODONE-acetaminophen (PERCOCET/ROXICET) 5-325 MG tablet Take 1 tablet by mouth every 6 (six) hours as needed for severe pain. 02/28/22   03/02/22, PA-C  predniSONE (DELTASONE) 10 MG tablet Take 5 tablets (50 mg total) by mouth daily. 01/06/22   03/06/22, MD     Exam: Current vital  signs: BP (!) 148/93   Pulse 81   Temp 98.3 F (36.8 C) (Oral)   Resp 20   Wt 80.6 kg   SpO2 99%   BMI 27.02 kg/m    Physical Exam  Constitutional: Appears well-developed and well-nourished.  Psych: Affect appropriate to situation Eyes: No scleral injection HENT: No OP obstrucion Head: Normocephalic.  Cardiovascular: Normal rate and regular rhythm.  Respiratory: Effort normal and breath sounds normal to anterior ascultation GI: Soft.  No distension. There is no tenderness.  Skin: WDI  Neuro: Mental Status: Patient is awake, alert, oriented to person, place, month, year, and situation. Patient is able to give a clear and coherent history. No signs of aphasia or neglect Cranial Nerves: II: Visual Fields are full. Pupils are equal, round, and reactive to light.   III,IV, VI: EOMI without ptosis or diploplia.  V: Facial sensation is symmetric to temperature VII: Facial movement is symmetric.  VIII: hearing is intact to voice X: Uvula elevates symmetrically XI: Shoulder shrug is symmetric. XII: tongue is midline without atrophy or fasciculations.  Motor: Tone is normal. Bulk is normal.  He has 4/5 weakness of the right arm and leg Sensory: Sensation is diminished on the right face, as well as right arm.  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   Secondary Diagnosis: Essential (primary) hypertension, Type 2 diabetes mellitus with hyperglycemia , and Hyponatremia   Impression: 56 year old male with acute onset right-sided weakness and numbness status post tenecteplase.  He will need to be admitted for stroke  work-up and post TNK monitoring.  Plan: Right-sided weakness: - HgbA1c, fasting lipid panel - MRI of the brain without contrast - Frequent neuro checks - Echocardiogram - CTA head and neck - Prophylactic therapy-none for 24 hours - Risk factor modification - Telemetry monitoring - PT consult, OT consult, Speech consult - Stroke team to follow  Diabetes: - SSI  Abdominal pain: -Amylase, lipase   56, MD [facility] [phone]  If 7pm- 7am, please page neurology on call as listed in AMION.

## 2022-06-18 NOTE — ED Notes (Signed)
Report given to [facility], RN with [facility] - 10-15 min out

## 2022-06-18 NOTE — ED Notes (Signed)
Telephone report given to Amy, RN at [facility] ICU

## 2022-06-18 NOTE — ED Notes (Signed)
Presents with slurred speech,  Last nml 1550hrs today Actual wt: 82.6kg

## 2022-06-18 NOTE — ED Notes (Signed)
Pt in CT SCAN 

## 2022-06-18 NOTE — ED Triage Notes (Signed)
Pt began to have facial droop 30 minutes ago (1550). Also had a syncopal episode at that time. EDP at bedside. Also complaining of R hand numbness.

## 2022-06-18 NOTE — ED Notes (Signed)
To CT scan via stretcher, sr x 2 up, with RN and cardiac monitoring

## 2022-06-18 NOTE — Progress Notes (Signed)
Code stroke activated per elert @ 1639, pt has already had NCCT And results available.  Neuro paged @ 1644, DR Otelia Limes on camera to assess patient @ 1654.  Pt BP low 97/68 at one point, NIH 3 per neuro, sx improving per ER nurse report.  TNK 20mg /41ml, pharmacy notified to place appropriate order. Needle time verified with bedside RN @ 1728. Pt sent back for additional imaging @1746 .  Notified 11m of need for rad read @ 1851, advised that it would be read ASAP.

## 2022-06-18 NOTE — ED Notes (Signed)
Neuro Tele online

## 2022-06-18 NOTE — ED Provider Notes (Signed)
[facility] EMERGENCY DEPARTMENT Provider Note   CSN: [id number] Arrival date & time: 06/18/22  1615     History Chief Complaint  Patient presents with   Facial Droop    Theodore Hinton is a 56 y.o. male with poorly controlled hypertension and diabetes who presents to the emergency department today for further evaluation of right-sided facial droop and right arm weakness and numbness.  This started approximately 30 minutes prior to arrival.  He denies any chest pain, shortness of breath.  Patient was seen and evaluated yesterday by myself in the emergency department for elevated blood pressures and elevated blood sugars.  He has been out of his blood pressure medication and diabetes medication for the last 2 weeks.  I gave him p.o. hydralazine yesterday and discharged him with his medication prescriptions.  He had a negative work-up yesterday.  HPI     Home Medications Prior to Admission medications   Medication Sig Start Date End Date Taking? Authorizing Provider  amLODipine (NORVASC) 5 MG tablet Take 1 tablet (5 mg total) by mouth daily. 01/06/22  Yes Derwood Kaplan, MD  cyclobenzaprine (FLEXERIL) 10 MG tablet Take 10 mg by mouth 2 (two) times daily as needed for muscle spasms. 05/14/22  Yes [provider]  glipiZIDE (GLUCOTROL) 5 MG tablet Take 1 tablet (5 mg total) by mouth daily before breakfast. 06/20/22 09/18/22 Yes Bailey-Modzik, Delila A, NP  lisinopril (ZESTRIL) 20 MG tablet Take 1 tablet (20 mg total) by mouth daily. 06/17/22  Yes Curatolo, Adam, DO  aspirin EC 81 MG tablet Take 1 tablet (81 mg total) by mouth daily. Swallow whole. 06/20/22   Bailey-Modzik, Delila A, NP  atorvastatin (LIPITOR) 80 MG tablet Take 1 tablet (80 mg total) by mouth daily. 06/21/22   Bailey-Modzik, Delila A, NP  clopidogrel (PLAVIX) 75 MG tablet Take 1 tablet (75 mg total) by mouth daily. 06/20/22   Bailey-Modzik, Delila A, NP  metFORMIN (GLUCOPHAGE) 500 MG tablet Take 2 tablets  (1,000 mg total) by mouth daily. 06/20/22   Bailey-Modzik, Ward Chatters, NP      Allergies    Patient has no known allergies.    Review of Systems   Review of Systems  All other systems reviewed and are negative.   Physical Exam Updated Vital Signs BP (!) 140/118   Pulse 72   Temp 98.4 F (36.9 C) (Oral)   Resp 13   Wt 80.6 kg   SpO2 94%   BMI 27.02 kg/m  Physical Exam Vitals and nursing note reviewed.  Constitutional:      General: He is not in acute distress.    Appearance: Normal appearance.  HENT:     Head: Normocephalic and atraumatic.  Eyes:     General:        Right eye: No discharge.        Left eye: No discharge.  Cardiovascular:     Comments: Regular rate and rhythm.  S1/S2 are distinct without any evidence of murmur, rubs, or gallops.  Radial pulses are 2+ bilaterally.  Dorsalis pedis pulses are 2+ bilaterally.  No evidence of pedal edema. Pulmonary:     Comments: Clear to auscultation bilaterally.  Normal effort.  No respiratory distress.  No evidence of wheezes, rales, or rhonchi heard throughout. Abdominal:     General: Abdomen is flat. Bowel sounds are normal. There is no distension.     Tenderness: There is no abdominal tenderness. There is no guarding or rebound.  Musculoskeletal:  General: Normal range of motion.     Cervical back: Neck supple.  Skin:    General: Skin is warm and dry.     Findings: No rash.  Neurological:     General: No focal deficit present.     Mental Status: He is alert.     Comments: Cranial nerves II through XII are intact apart from cranial nerve VII is there seems to be a slight nerve palsy with the right sided facial droop.  Forehead is spared.  There is also decreased sensation of the trigeminal distribution on the right side of the face subjectively.  He has decreased grip strength on the right in comparison to the left.  Patient is left-handed.  Slight decrease to flexion extension of the right upper extremity.  Lower  extremities have normal sensation and good strength bilaterally.  Psychiatric:        Mood and Affect: Mood normal.        Behavior: Behavior normal.     ED Results / Procedures / Treatments   Labs (all labs ordered are listed, but only abnormal results are displayed) Labs Reviewed  ETHANOL - Abnormal; Notable for the following components:      Result Value   Alcohol, Ethyl (B) 18 (*)    All other components within normal limits  APTT - Abnormal; Notable for the following components:   aPTT 20 (*)    All other components within normal limits  COMPREHENSIVE METABOLIC PANEL - Abnormal; Notable for the following components:   Sodium 133 (*)    CO2 18 (*)    Glucose, Bld 430 (*)    Total Bilirubin 2.2 (*)    Anion gap 16 (*)    All other components within normal limits  RAPID URINE DRUG SCREEN, HOSP PERFORMED - Abnormal; Notable for the following components:   Opiates POSITIVE (*)    All other components within normal limits  URINALYSIS, ROUTINE W REFLEX MICROSCOPIC - Abnormal; Notable for the following components:   Glucose, UA >=500 (*)    Ketones, ur 15 (*)    All other components within normal limits  CBC WITH DIFFERENTIAL/PLATELET - Abnormal; Notable for the following components:   WBC 11.5 (*)    RBC 6.06 (*)    Hemoglobin 19.2 (*)    HCT 53.8 (*)    Neutro Abs 8.3 (*)    All other components within normal limits  URINALYSIS, MICROSCOPIC (REFLEX) - Abnormal; Notable for the following components:   Bacteria, UA RARE (*)    All other components within normal limits  LIPID PANEL - Abnormal; Notable for the following components:   HDL 25 (*)    LDL Cholesterol 121 (*)    All other components within normal limits  HEMOGLOBIN A1C - Abnormal; Notable for the following components:   Hgb A1c MFr Bld 10.3 (*)    All other components within normal limits  AMYLASE - Abnormal; Notable for the following components:   Amylase 13 (*)    All other components within normal limits   GLUCOSE, CAPILLARY - Abnormal; Notable for the following components:   Glucose-Capillary 268 (*)    All other components within normal limits  GLUCOSE, CAPILLARY - Abnormal; Notable for the following components:   Glucose-Capillary 121 (*)    All other components within normal limits  GLUCOSE, CAPILLARY - Abnormal; Notable for the following components:   Glucose-Capillary 158 (*)    All other components within normal limits  GLUCOSE, CAPILLARY - Abnormal;  Notable for the following components:   Glucose-Capillary 235 (*)    All other components within normal limits  GLUCOSE, CAPILLARY - Abnormal; Notable for the following components:   Glucose-Capillary 256 (*)    All other components within normal limits  GLUCOSE, CAPILLARY - Abnormal; Notable for the following components:   Glucose-Capillary 207 (*)    All other components within normal limits  GLUCOSE, CAPILLARY - Abnormal; Notable for the following components:   Glucose-Capillary 150 (*)    All other components within normal limits  GLUCOSE, CAPILLARY - Abnormal; Notable for the following components:   Glucose-Capillary 151 (*)    All other components within normal limits  GLUCOSE, CAPILLARY - Abnormal; Notable for the following components:   Glucose-Capillary 176 (*)    All other components within normal limits  BASIC METABOLIC PANEL - Abnormal; Notable for the following components:   Potassium 3.3 (*)    CO2 20 (*)    Glucose, Bld 284 (*)    Calcium 8.4 (*)    All other components within normal limits  GLUCOSE, CAPILLARY - Abnormal; Notable for the following components:   Glucose-Capillary 236 (*)    All other components within normal limits  CBG MONITORING, ED - Abnormal; Notable for the following components:   Glucose-Capillary 410 (*)    All other components within normal limits  CBG MONITORING, ED - Abnormal; Notable for the following components:   Glucose-Capillary 417 (*)    All other components within normal  limits  I-STAT VENOUS BLOOD GAS, ED - Abnormal; Notable for the following components:   pCO2, Ven 31.8 (*)    Bicarbonate 18.3 (*)    TCO2 19 (*)    Acid-base deficit 5.0 (*)    Calcium, Ion 1.11 (*)    HCT 61.0 (*)    Hemoglobin 20.7 (*)    All other components within normal limits  RESP PANEL BY RT-PCR (FLU A&B, COVID) ARPGX2  MRSA NEXT GEN BY PCR, NASAL  PROTIME-INR  HIV ANTIBODY (ROUTINE TESTING W REFLEX)  LIPASE, BLOOD    EKG EKG Interpretation  Date/Time:  Saturday June 18 2022 16:57:23 EDT Ventricular Rate:  102 PR Interval:  174 QRS Duration: 91 QT Interval:  363 QTC Calculation: 473 R Axis:   -76 Text Interpretation: Sinus tachycardia Inferior infarct, old Confirmed by Kommor, Madison ([id number]) on 06/19/2022 2:07:51 PM  Radiology No results found.  Procedures .Critical Care  Performed by: Teressa Lower, PA-C Authorized by: Teressa Lower, PA-C   Critical care provider statement:    Critical care time (minutes):  60   Critical care time was exclusive of:  Separately billable procedures and treating other patients   Critical care was necessary to treat or prevent imminent or life-threatening deterioration of the following conditions:  CNS failure or compromise   Critical care was time spent personally by me on the following activities:  Blood draw for specimens, development of treatment plan with patient or surrogate, discussions with consultants, discussions with primary provider, evaluation of patient's response to treatment, examination of patient, pulse oximetry, ordering and review of radiographic studies, ordering and review of laboratory studies, ordering and performing treatments and interventions and re-evaluation of patient's condition     Medications Ordered in ED Medications  sodium chloride 0.9 % bolus 1,000 mL (0 mLs Intravenous Stopped 06/18/22 2002)  tenecteplase (TNKASE) injection for Stroke 20 mg (20 mg Intravenous Given 06/18/22 1728)   iohexol (OMNIPAQUE) 350 MG/ML injection 75 mL (75 mLs Intravenous Contrast  Given 06/18/22 1737)   stroke: early stages of recovery book ( Does not apply Given 06/18/22 2234)  potassium chloride SA (KLOR-CON M) CR tablet 40 mEq (40 mEq Oral Given 06/20/22 1248)    ED Course/ Medical Decision Making/ A&P Clinical Course as of 07/07/22 1231  Sat Jun 18, 2022  1750 I spoke with Dr. Wilford Corner with neurology who states that there are beds in the neuro ICU and we can transfer the patient after CTA head and neck.  TNK started. [CF]  1750 I-Stat venous blood gas, ED(!) Bicarb is a little low.  No other electrolyte abnormalities apart from calcium. [CF]  1751 CBG monitoring, ED(!) CBG 417. [CF]  1751 Comprehensive metabolic panel(!) Mild hyponatremia. [CF]  1751 Ethanol(!) Slightly positive. [CF]  1751 APTT(!) Negative. [CF]  1751 Protime-INR Normal. [CF]    Clinical Course User Index [CF] Teressa Lower, PA-C                           Medical Decision Making Theodore Hinton is a 56 y.o. male patient who presents to the emerged from today for further evaluation of right-sided facial droop and weakness.  Given the time course of his symptoms code stroke was initiated.  Patient was immediately rolled back to CT.  Telemetry neuro to evaluate.  Patient's blood pressure is soft today.  I saw him yesterday with blood pressures above 200.  There is question that maybe he is just hypoperfused.   Amount and/or Complexity of Data Reviewed Labs: ordered. Decision-making details documented in ED Course. Radiology: ordered.  Risk Prescription drug management. Decision regarding hospitalization.   I do feel the patient would likely benefit from admission secondary to CVA.  tPA was given.  Spoke with neurology which is highlighted in ED course.  He will be admitted to neuro ICU.  Final Clinical Impression(s) / ED Diagnoses Final diagnoses:  Cerebrovascular accident (CVA), unspecified mechanism (HCC)     Rx / DC Orders ED Discharge Orders          Ordered    atorvastatin (LIPITOR) 80 MG tablet  Daily        06/20/22 1101    aspirin EC 81 MG tablet  Daily        06/20/22 1101    clopidogrel (PLAVIX) 75 MG tablet  Daily        06/20/22 1101    Ambulatory referral to Neurology       Comments: An appointment is requested in approximately: 8 weeks   06/20/22 1112    Ambulatory referral to Physical Therapy        06/20/22 1116    Ambulatory referral to Occupational Therapy        06/20/22 1116    metFORMIN (GLUCOPHAGE) 500 MG tablet  Daily        06/20/22 1210    glipiZIDE (GLUCOTROL) 5 MG tablet  Daily before breakfast        06/20/22 1210              Honor Loh Dawson, PA-C 07/07/22 1232    Benjiman Core, MD 07/11/22 [phone]

## 2022-06-19 ENCOUNTER — Inpatient Hospital Stay (HOSPITAL_COMMUNITY): Payer: Commercial Managed Care - HMO

## 2022-06-19 DIAGNOSIS — I6389 Other cerebral infarction: Secondary | ICD-10-CM | POA: Diagnosis not present

## 2022-06-19 DIAGNOSIS — I639 Cerebral infarction, unspecified: Secondary | ICD-10-CM | POA: Diagnosis not present

## 2022-06-19 LAB — LIPASE, BLOOD: Lipase: 29 U/L (ref 11–51)

## 2022-06-19 LAB — GLUCOSE, CAPILLARY
Glucose-Capillary: 121 mg/dL — ABNORMAL HIGH (ref 70–99)
Glucose-Capillary: 150 mg/dL — ABNORMAL HIGH (ref 70–99)
Glucose-Capillary: 158 mg/dL — ABNORMAL HIGH (ref 70–99)
Glucose-Capillary: 207 mg/dL — ABNORMAL HIGH (ref 70–99)
Glucose-Capillary: 235 mg/dL — ABNORMAL HIGH (ref 70–99)
Glucose-Capillary: 256 mg/dL — ABNORMAL HIGH (ref 70–99)
Glucose-Capillary: 268 mg/dL — ABNORMAL HIGH (ref 70–99)

## 2022-06-19 LAB — HEMOGLOBIN A1C
Hgb A1c MFr Bld: 10.3 % — ABNORMAL HIGH (ref 4.8–5.6)
Mean Plasma Glucose: 248.91 mg/dL

## 2022-06-19 LAB — ECHOCARDIOGRAM COMPLETE
Area-P 1/2: 2.29 cm2
S' Lateral: 2.6 cm
Weight: 2843.05 oz

## 2022-06-19 LAB — LIPID PANEL
Cholesterol: 171 mg/dL (ref 0–200)
HDL: 25 mg/dL — ABNORMAL LOW (ref >40)
LDL Cholesterol: 121 mg/dL — ABNORMAL HIGH (ref 0–99)
Total CHOL/HDL Ratio: 6.8 RATIO
Triglycerides: 127 mg/dL (ref <150)
VLDL: 25 mg/dL (ref 0–40)

## 2022-06-19 LAB — AMYLASE: Amylase: 13 U/L — ABNORMAL LOW (ref 28–100)

## 2022-06-19 LAB — HIV ANTIBODY (ROUTINE TESTING W REFLEX): HIV Screen 4th Generation wRfx: NONREACTIVE

## 2022-06-19 MED ORDER — ATORVASTATIN CALCIUM 80 MG PO TABS
80.0000 mg | ORAL_TABLET | Freq: Every day | ORAL | Status: DC
  Administered 2022-06-19 – 2022-06-20 (×2): 80 mg via ORAL
  Filled 2022-06-19 (×2): qty 1

## 2022-06-19 MED ORDER — AMLODIPINE BESYLATE 5 MG PO TABS
5.0000 mg | ORAL_TABLET | Freq: Every day | ORAL | Status: DC
  Administered 2022-06-19 – 2022-06-20 (×2): 5 mg via ORAL
  Filled 2022-06-19 (×2): qty 1

## 2022-06-19 MED ORDER — OXYCODONE-ACETAMINOPHEN 5-325 MG PO TABS
1.0000 | ORAL_TABLET | Freq: Four times a day (QID) | ORAL | Status: DC | PRN
  Administered 2022-06-19 – 2022-06-20 (×5): 1 via ORAL
  Filled 2022-06-19 (×6): qty 1

## 2022-06-19 MED ORDER — CLOPIDOGREL BISULFATE 75 MG PO TABS
75.0000 mg | ORAL_TABLET | Freq: Every day | ORAL | Status: DC
  Administered 2022-06-19: 75 mg via ORAL
  Filled 2022-06-19: qty 1

## 2022-06-19 MED ORDER — ASPIRIN 81 MG PO TBEC
81.0000 mg | DELAYED_RELEASE_TABLET | Freq: Every day | ORAL | Status: DC
  Administered 2022-06-19: 81 mg via ORAL
  Filled 2022-06-19: qty 1

## 2022-06-19 NOTE — Evaluation (Addendum)
Physical Therapy Evaluation & Discharge  Patient Details Name: Theodore Hinton MRN: [id number] DOB: 1966/08/02 Today's Date: 06/19/2022  History of Present Illness  Pt is a 56 y.o. M who presents with onset of right sided facial droop, facial numbness, slurred speech and RUE weakness. Pt given IV tenecteplase. CT head negative for acute abnormality. Significant PMH: DM, stroke with residual right sided deficit, and HTN.  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 Pt is currently supervision for ambulation of 21ft and min guard for negotiation of 6 steps with R rail. Pt scored 16/24 on the DGI indicating he is at increased risk for falls. Demonstrating decreased safety awareness throughout. PT will sign off due to pts level of independence. Recommending OPPT upon discharge to maximize functional independence and facilitate return to work.      Recommendations for follow up therapy are one component of a multi-disciplinary discharge planning process, led by the attending physician.  Recommendations may be updated based on patient status, additional functional criteria and insurance authorization.  Follow Up Recommendations Outpatient PT      Assistance Recommended at Discharge PRN  Patient can return home with the following  A little help with bathing/dressing/bathroom;Assistance with cooking/housework;Assist for transportation;Help with stairs or ramp for entrance    Equipment Recommendations None recommended by PT  Recommendations for Other Services       Functional Status Assessment Patient has had a recent decline in their functional status and demonstrates the ability to make significant improvements in function in a reasonable and predictable amount of time.     Precautions / Restrictions Precautions Precautions: Fall Restrictions Weight Bearing Restrictions: No      Mobility   Bed Mobility Overal bed mobility: Modified Independent             General bed mobility comments: Pt had ambulated to the bathroom independently prior to arrival    Transfers Overall transfer level: Independent Equipment used: None                    Ambulation/Gait Ambulation/Gait assistance: Supervision Gait Distance (Feet): 200 Feet Assistive device: None Gait Pattern/deviations: Step-through pattern, Decreased stance time - right, Decreased step length - left, Decreased dorsiflexion - right Gait velocity: functional Gait velocity interpretation: 1.31 - 2.62 ft/sec, indicative of limited community ambulator   General Gait Details: Pt actively shifting weight to LLE reporting weakness in RLE. decreased arm swing noted but able to maintain coversation and demonstrated UE AROM while ambulating  Stairs Stairs: Yes Stairs assistance: Min guard Stair Management: One rail Right, Step to pattern, Forwards Number of Stairs: 6 General stair comments: decreased safety awareness with attempts for reciprocal pattern, cued for step-to pattern. min guard for safety  Wheelchair Mobility    Modified Rankin (Stroke Patients Only)  Pre-Morbid Rankin Score: 0 - No problems or symptoms Modified Rankin: 1 - Can do everything, but has symptoms (i.e. difficulty doing things / slower, tingling, etc.)      Balance                                             Pertinent Vitals/Pain Pain Assessment Pain Assessment: No/denies pain    Home Living Family/patient expects to be discharged to:: Private residence Living Arrangements: Spouse/significant other;Children Available Help at Discharge: Family  Type of Home: House Home Access: Stairs to enter Entrance Stairs-Rails: None Entrance Stairs-Number of Steps: 1   Home Layout: One level Home Equipment: None Additional Comments: Owns heating/cooling business    Prior Function Prior Level of Function :  Independent/Modified Independent;Driving;Working/employed                     Hand Dominance        Extremity/Trunk Assessment   Upper Extremity Assessment Upper Extremity Assessment: RUE deficits/detail RUE Deficits / Details: good strength, diminished sensation throughout arm/hand RUE Sensation: decreased light touch    Lower Extremity Assessment Lower Extremity Assessment: RLE deficits/detail RLE Deficits / Details: 4/5 DF + PF, diminished sensation throughout. Pt reports baseline neuropathy but feels it is worse after stroke RLE Sensation: decreased light touch;history of peripheral neuropathy RLE Coordination: WNL    Cervical / Trunk Assessment Cervical / Trunk Assessment: Normal  Communication   Communication: No difficulties  Cognition Arousal/Alertness: Awake/alert Behavior During Therapy: WFL for tasks assessed/performed, Impulsive Overall Cognitive Status: Impaired/Different from baseline Area of Impairment: Safety/judgement, Problem solving                         Safety/Judgement: Decreased awareness of safety, Decreased awareness of deficits   Problem Solving: Difficulty sequencing, Requires verbal cues, Requires tactile cues General Comments: Pt had disconnected lines and ambulated to bathroom alone upon PT arrival. Requiring increased time and cueing for directions        General Comments      Exercises     Assessment/Plan    PT Assessment Patient does not need any further PT services  PT Problem List         PT Treatment Interventions      PT Goals (Current goals can be found in the Care Plan section)  Acute Rehab PT Goals Patient Stated Goal: to return to work PT Goal Formulation: All assessment and education complete, DC therapy    Frequency       Co-evaluation               AM-PAC PT "6 Clicks" Mobility  Outcome Measure Help needed turning from your back to your side while in a flat bed without using  bedrails?: None Help needed moving from lying on your back to sitting on the side of a flat bed without using bedrails?: None Help needed moving to and from a bed to a chair (including a wheelchair)?: None Help needed standing up from a chair using your arms (e.g., wheelchair or bedside chair)?: None Help needed to walk in hospital room?: A Little Help needed climbing 3-5 steps with a railing? : A Little 6 Click Score: 22    End of Session   Activity Tolerance: Patient tolerated treatment well Patient left: in bed;with call bell/phone within reach;with bed alarm set;with family/visitor present Nurse Communication: Mobility status      Time: 2229-7989 PT Time Calculation (min) (ACUTE ONLY): 17 min   Charges:   PT Evaluation $PT Eval Low Complexity: 1 Low        Davina Poke, SPT Acute Rehabilitation Services  Office: [phone]   Davina Poke 06/19/2022, 11:16 AM

## 2022-06-19 NOTE — Progress Notes (Addendum)
STROKE TEAM PROGRESS NOTE   INTERVAL HISTORY His son is at the bedside. RN at the bedside. LDL 121-, Will add atorvastatin 80mg . A1c 10.3 needs close follow up care. He states he does not go regularly to the MD. Pateint is alert and oriented x4, no aphasia, no dysarthria, right facial droop. EOMI, tracks, visual field full, uppers 5/5, right grip weakness, right weakness on hand orbiting, no drift, bilateral lowers 4/5 due to back pain. No sensation deficits. No ataxia. MRI pending. Patient was educated by Dr Leonie Man about the importance of follow up care and taking all prescribed medications to reduce stroke risk factors. Patient verbalized understanding  Vitals:   06/19/22 0400 06/19/22 0500 06/19/22 0600 06/19/22 0700  BP: (!) 153/97 114/67 (!) 135/92 (!) 160/91  Pulse: 67 (!) 58 60 70  Resp: 20 15 14 17   Temp: 98 F (36.7 C)     TempSrc: Oral     SpO2: 100% 97% 99% 98%  Weight:       CBC:  Recent Labs  Lab 06/17/22 1700 06/18/22 1653 06/18/22 1720  WBC 9.4  --  11.5*  NEUTROABS 6.5  --  8.3*  HGB 19.4* 20.7* 19.2*  HCT 55.1* 61.0* 53.8*  MCV 88.3  --  88.8  PLT 194  --  [id number]   Basic Metabolic Panel:  Recent Labs  Lab 06/17/22 1700 06/18/22 1615 06/18/22 1653  NA 133* 133* 135  K 4.5 3.8 3.7  CL 104 99  --   CO2 19* 18*  --   GLUCOSE 265* 430*  --   BUN 9 13  --   CREATININE 0.57* 1.15  --   CALCIUM 8.9 9.2  --    Lipid Panel:  Recent Labs  Lab 06/19/22 0316  CHOL 171  TRIG 127  HDL 25*  CHOLHDL 6.8  VLDL 25  LDLCALC 121*   HgbA1c:  Recent Labs  Lab 06/19/22 0316  HGBA1C 10.3*   Urine Drug Screen:  Recent Labs  Lab 06/18/22 1857  LABOPIA POSITIVE*  COCAINSCRNUR NONE DETECTED  LABBENZ NONE DETECTED  AMPHETMU NONE DETECTED  THCU NONE DETECTED  LABBARB NONE DETECTED    Alcohol Level  Recent Labs  Lab 06/18/22 1615  ETH 18*    IMAGING past 24 hours CT ANGIO HEAD NECK W WO CM (CODE STROKE)  Result Date: 06/18/2022 CLINICAL DATA:  Facial  droop. Syncopal episode. Right hand numbness. EXAM: CT ANGIOGRAPHY HEAD AND NECK TECHNIQUE: Multidetector CT imaging of the head and neck was performed using the standard protocol during bolus administration of intravenous contrast. Multiplanar CT image reconstructions and MIPs were obtained to evaluate the vascular anatomy. Carotid stenosis measurements (when applicable) are obtained utilizing NASCET criteria, using the distal internal carotid diameter as the denominator. RADIATION DOSE REDUCTION: This exam was performed according to the departmental dose-optimization program which includes automated exposure control, adjustment of the mA and/or kV according to patient size and/or use of iterative reconstruction technique. CONTRAST:  40mL OMNIPAQUE IOHEXOL 350 MG/ML SOLN COMPARISON:  CT head without contrast 06/18/2022 and 06/17/2022 FINDINGS: CTA NECK FINDINGS Aortic arch: 3 vessel arch configuration is present. No significant stenosis is present at the great vessel origins. Mild atherosclerotic changes are present in the more distal arch without aneurysm or stenosis. Right carotid system: The right common carotid artery is within normal limits. Bifurcation is unremarkable. Minimal mural calcification is present in the proximal right ICA. No significant stenosis is present. More distal right ICA is normal. Left carotid system:  The left common carotid artery is within normal limits. Mild atherosclerotic changes are present bifurcation. No significant stenosis is present. Cervical left ICA is normal. Vertebral arteries: The vertebral arteries are codominant. Both vertebral arteries originate from the subclavian arteries. Calcifications present at the origin left subclavian artery. 50% stenosis is proximal at both origins. No tandem stenoses are present in either vertebral artery in the neck. Skeleton: Mild degenerative changes of the cervical spine are most noted at C4-5. Vertebral body heights and alignment are  otherwise normal. No focal osseous lesions are present. Other neck: Soft tissues the neck are otherwise unremarkable. Salivary glands are within normal limits. Thyroid is normal. No significant adenopathy is present. No focal mucosal or submucosal lesions are present. Upper chest: Mild dependent atelectasis present. Lung apices are otherwise clear. The thoracic inlet is within normal limits. Review of the MIP images confirms the above findings CTA HEAD FINDINGS Anterior circulation: Atherosclerotic calcifications are present within the cavernous internal carotid arteries bilaterally. No significant stenosis is present through the ICA terminus. Moderate narrowing is present in the proximal left M1 segment. More mild narrowing is present in the proximal right M1 segment. MCA bifurcations are within normal limits bilaterally. A1 segments are normal bilaterally. The ACA and MCA branch vessels are within normal limits bilaterally. Posterior circulation: PICA origins are visualized and within normal limits. The vertebrobasilar junction and basilar artery are normal. Both posterior cerebral arteries originate from basilar tip. High-grade stenosis is present in the proximal right P2 segment. Additional segmental narrowing is present. Mild distal P2 segment stenosis is present on the left. More distal PCA branch vessels are within normal limits. Venous sinuses: The dural sinuses are patent. The straight sinus deep cerebral veins are intact. Cortical veins are within normal limits. No significant vascular malformation is evident. Anatomic variants: None Review of the MIP images confirms the above findings IMPRESSION: 1. Moderate left M1 segment stenosis. Distal MCA branch vessels fill normally. 2. High-grade stenosis of the proximal right P2 segment with attenuation of more distal right-sided vessels. 3. Mild narrowing of the proximal right M1 segment. 4. Mild atherosclerotic changes at the carotid bifurcations bilaterally  without significant stenosis. 5. 50% stenosis at the origins of both vertebral arteries. 6. Mild degenerative changes of the cervical spine are most noted at C4-5. These results were called by telephone at the time of interpretation on 06/18/2022 at 7:13 pm to provider Dr. Alvino Chapel, who verbally acknowledged these results. Electronically Signed   By: San Morelle M.D.   On: 06/18/2022 19:13   CT HEAD CODE STROKE WO CONTRAST  Result Date: 06/18/2022 CLINICAL DATA:  Code stroke. Right facial droop beginning 30 minutes ago. Right hand numbness. EXAM: CT HEAD WITHOUT CONTRAST TECHNIQUE: Contiguous axial images were obtained from the base of the skull through the vertex without intravenous contrast. RADIATION DOSE REDUCTION: This exam was performed according to the departmental dose-optimization program which includes automated exposure control, adjustment of the mA and/or kV according to patient size and/or use of iterative reconstruction technique. COMPARISON:  CT head without contrast 06/17/2022 FINDINGS: Brain: No acute infarct, hemorrhage, or mass lesion is present. The ventricles are of normal size. Coronal fissure cyst again noted on the right. Mild white matter hypoattenuation is stable. No significant extraaxial fluid collection is present. The brainstem and cerebellum are within normal limits. Vascular: Atherosclerotic calcifications are present within the cavernous internal carotid arteries and at the dural margin of both vertebral arteries. Skull: Calvarium is intact. No focal lytic or blastic  lesions are present. No significant extracranial soft tissue lesion is present. Sinuses/Orbits: Fluid is present in the sphenoid sinuses bilaterally. Scattered mucosal thickening is present throughout the ethmoid air cells. 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 Mastoid air cells are clear bilaterally. The globes and orbits are within normal  limits. ASPECTS [facility] Stroke Program Early CT Score) - Ganglionic level infarction (caudate, lentiform nuclei, internal capsule, insula, M1-M3 cortex): 7/7 - Supraganglionic infarction (M4-M6 cortex): 3/3 Total score (0-10 with 10 being normal): 10/10 IMPRESSION: 1. No acute intracranial abnormality or significant interval change. 2. Stable mild white matter disease. 3. ASPECTS is 10/10. These results were called by telephone at the time of interpretation on 06/18/2022 at 4:42 pm to provider Dr. Rubin Payor, who verbally acknowledged these results. Electronically Signed   By: Marin Roberts M.D.   On: 06/18/2022 16:42    PHYSICAL EXAM Pateint is alert and oriented x4, no aphasia, no dysarthria, right facial droop. EOMI, tracks, visual field full, uppers 5/5, right grip weakness, right weakness on hand orbiting, diminished fine finger movements on the right.  No drift, bilateral lowers 4/5 due to back pain. No sensation deficits.  Mild subjective paresthesias around the right lip and cheek.  No ataxia.  ASSESSMENT/PLAN Theodore Hinton is a 56 y.o. male with history of HTN, uncontrolled DM, chronic back who presents the [facility]  acute onset right-sided weakness and numbness and slurred speech s/p  TNK. NIHSS 4  Stroke  Acute left MCA ischemic infarct s/p TNK Etiology:  small vessel disease  Code Stroke CT head No acute abnormality. ASPECTS 10. Stable mild white matter disease   CTA head & neck  1. Moderate left M1 segment stenosis. Distal MCA branch vessels fill normally. 2. High-grade stenosis of the proximal right P2 segment with attenuation of more distal right-sided vessels. 3. Mild narrowing of the proximal right M1 segment. 4. Mild atherosclerotic changes at the carotid bifurcations bilaterally without significant stenosis. 5. 50% stenosis at the origins of both vertebral arteries. 6. Mild degenerative changes of the cervical spine are most noted at C4-5.  MRI  pending 2D Echo  pending LDL 121 HgbA1c 10.3 VTE prophylaxis - SCD's    Diet   Diet regular Room service appropriate? Yes; Fluid consistency: Thin   No antithrombotic prior to admission, now on No antithrombotic. If no hemorrhage on 24 hr post TNK brain imaging will start ASA and Plavix Therapy recommendations:  pending Disposition:  pending  Hypertension Home meds:  norvasc and lisinopril Stable Long-term BP goal normotensive  Hyperlipidemia Home meds:  none LDL 121, goal < 70 Add atorvastatin 80 mg   Continue statin at discharge  Diabetes type II UnControlled Home meds:  metformin  HgbA1c 10.3, goal < 7.0 CBGs Recent Labs    06/19/22 0036 06/19/22 0337 06/19/22 0729  GLUCAP 268* 121* 158*   SSI Will need close outpatient monitoring with PCP  Other Stroke Risk Factors ETOH use, alcohol level 18, advised to drink no more than 1 drink(s) a day  Other Active Problems Chronic back pain  Hospital day # 1  Gevena Mart DNP, ACNPC-AG   STROKE MD NOTE :  I have personally obtained history,examined this patient, reviewed notes, independently viewed imaging studies, participated in medical decision making and plan of care.ROS completed by me personally and pertinent positives fully documented  I have made any additions or clarifications directly to the above note. Agree with note above.  Patient presented  with sudden onset of right sided weakness and numbness due to suspected left brain subcortical infarct.  He was treated with IV TNK and has made near complete recovery.  He only has mild subjective right perioral paresthesias and diminished fine motor movements on the right.  Continue close neurological observation and strict blood pressure control as per postthrombolytic protocol.  Mobilize out of bed.  Therapy consults.  Check MRI scan of the brain later today continue ongoing stroke work-up.  Patient counseled to be compliant with taking his medications and aggressive risk factor  modification.  We will start antiplatelet agents after MRI scan.  Long discussion with patient and son at the bedside and answered questions.This patient is critically ill and at significant risk of neurological worsening, death and care requires constant monitoring of vital signs, hemodynamics,respiratory and cardiac monitoring, extensive review of multiple databases, frequent neurological assessment, discussion with family, other specialists and medical decision making of high complexity.I have made any additions or clarifications directly to the above note.This critical care time does not reflect procedure time, or teaching time or supervisory time of PA/NP/Med Resident etc but could involve care discussion time.  I spent 30 minutes of neurocritical care time  in the care of  this patient.      Delia Heady, MD Medical Director [facility] Pager: [phone] 06/19/2022 1:55 PM  To contact Stroke Continuity provider, please refer to [email or web address]. After hours, contact General Neurology

## 2022-06-19 NOTE — Progress Notes (Signed)
OT Cancellation Note  Patient Details Name: Theodore Hinton MRN: [id number] DOB: 02-26-1966   Cancelled Treatment:    Reason Eval/Treat Not Completed: Active bedrest order  [name] 06/19/2022, 6:45 AM

## 2022-06-19 NOTE — Progress Notes (Signed)
  Echocardiogram 2D Echocardiogram has been performed.  Delcie Roch 06/19/2022, 2:21 PM

## 2022-06-19 NOTE — Evaluation (Signed)
Occupational Therapy Evaluation Patient Details Name: Theodore Hinton MRN: [id number] DOB: 1966-07-17 Today's Date: 06/19/2022   History of Present Illness 56 y.o. M who presents with onset of right sided facial droop, facial numbness, slurred speech and RUE weakness. Pt given IV tenecteplase. CT head negative for acute abnormality. Significant PMH: DM, stroke with residual right sided deficit, and HTN.   Clinical Impression   PT admitted with R facial droop . Pt currently with functional limitiations due to the deficits listed below (see OT problem list). Pt currently with sensation changes in face R side and hand. Pt reports feeling very frustrated with R facial weakness and states "I can't stand this" Pt will benefit from skilled OT to increase their independence and safety with adls and balance to allow discharge outpatient for balance. Pt attempting R side exit and entrance of bed with cues so continue to monitor for L sided inattention.        Recommendations for follow up therapy are one component of a multi-disciplinary discharge planning process, led by the attending physician.  Recommendations may be updated based on patient status, additional functional criteria and insurance authorization.   Follow Up Recommendations  Outpatient OT    Assistance Recommended at Discharge Set up Supervision/Assistance  Patient can return home with the following Assist for transportation    Functional Status Assessment  Patient has had a recent decline in their functional status and demonstrates the ability to make significant improvements in function in a reasonable and predictable amount of time.  Equipment Recommendations  None recommended by OT    Recommendations for Other Services       Precautions / Restrictions Precautions Precautions: Fall Restrictions Weight Bearing Restrictions: No      Mobility Bed Mobility Overal bed mobility: Modified Independent                   Transfers Overall transfer level: Independent Equipment used: None                      Balance Overall balance assessment: Modified Independent                                         ADL either performed or assessed with clinical judgement   ADL Overall ADL's : Needs assistance/impaired Eating/Feeding: Set up   Grooming: Set up   Upper Body Bathing: Set up   Lower Body Bathing: Minimal assistance           Toilet Transfer: Min guard           Functional mobility during ADLs: Min guard       Vision Baseline Vision/History: 0 No visual deficits       Perception     Praxis      Pertinent Vitals/Pain Pain Assessment Pain Assessment: No/denies pain     Hand Dominance Right   Extremity/Trunk Assessment Upper Extremity Assessment Upper Extremity Assessment: Overall WFL for tasks assessed;RUE deficits/detail RUE Deficits / Details: reports numbness in hand   Lower Extremity Assessment Lower Extremity Assessment: Defer to PT evaluation   Cervical / Trunk Assessment Cervical / Trunk Assessment: Normal   Communication Communication Communication: No difficulties   Cognition Arousal/Alertness: Awake/alert Behavior During Therapy: WFL for tasks assessed/performed, Impulsive Overall Cognitive Status: Impaired/Different from baseline Area of Impairment: Safety/judgement, Problem solving  Safety/Judgement: Decreased awareness of safety, Decreased awareness of deficits   Problem Solving: Difficulty sequencing, Requires verbal cues, Requires tactile cues General Comments: pt attempting to get out of bed with R rail up and again when returning from bathroom attemting to go to the R side of the bed despite the rail up     General Comments  pt could benefit further from higher executive cognitive testing    Exercises     Shoulder Instructions      Home Living Family/patient expects to be  discharged to:: Private residence Living Arrangements: Spouse/significant other;Children Available Help at Discharge: Family Type of Home: House Home Access: Stairs to enter Secretary/administrator of Steps: 1 Entrance Stairs-Rails: None Home Layout: One level     Bathroom Shower/Tub: Tub/shower unit         Home Equipment: None   Additional Comments: Owns heating/cooling business      Prior Functioning/Environment Prior Level of Function : Independent/Modified Independent;Driving;Working/employed                        OT Problem List: Impaired balance (sitting and/or standing);Decreased activity tolerance;Decreased cognition;Decreased safety awareness      OT Treatment/Interventions: Self-care/ADL training;Therapeutic exercise;Energy conservation;DME and/or AE instruction;Manual therapy;Therapeutic activities;Cognitive remediation/compensation;Patient/family education;Balance training    OT Goals(Current goals can be found in the care plan section) Acute Rehab OT Goals Patient Stated Goal: to get MRI OT Goal Formulation: With patient/family Time For Goal Achievement: 07/03/22 Potential to Achieve Goals: Good  OT Frequency: Min 2X/week    Co-evaluation              AM-PAC OT "6 Clicks" Daily Activity     Outcome Measure Help from another person eating meals?: A Little Help from another person taking care of personal grooming?: A Little Help from another person toileting, which includes using toliet, bedpan, or urinal?: A Little Help from another person bathing (including washing, rinsing, drying)?: A Little Help from another person to put on and taking off regular upper body clothing?: A Little Help from another person to put on and taking off regular lower body clothing?: A Little 6 Click Score: 18   End of Session Equipment Utilized During Treatment: Gait belt Nurse Communication: Mobility status;Precautions  Activity Tolerance: Patient tolerated  treatment well Patient left: in bed;with call bell/phone within reach;with bed alarm set  OT Visit Diagnosis: Unsteadiness on feet (R26.81);Muscle weakness (generalized) (M62.81)                Time: 1337-1350 OT Time Calculation (min): 13 min Charges:  OT General Charges $OT Visit: 1 Visit OT Evaluation $OT Eval Low Complexity: 1 Low   Brynn, OTR/L  Acute Rehabilitation Services Office: [phone] .   [name] 06/19/2022, 3:29 PM

## 2022-06-20 DIAGNOSIS — I639 Cerebral infarction, unspecified: Secondary | ICD-10-CM | POA: Diagnosis not present

## 2022-06-20 LAB — BASIC METABOLIC PANEL
Anion gap: 10 (ref 5–15)
BUN: 9 mg/dL (ref 6–20)
CO2: 20 mmol/L — ABNORMAL LOW (ref 22–32)
Calcium: 8.4 mg/dL — ABNORMAL LOW (ref 8.9–10.3)
Chloride: 106 mmol/L (ref 98–111)
Creatinine, Ser: 0.79 mg/dL (ref 0.61–1.24)
GFR, Estimated: >60 mL/min (ref >60)
Glucose, Bld: 284 mg/dL — ABNORMAL HIGH (ref 70–99)
Potassium: 3.3 mmol/L — ABNORMAL LOW (ref 3.5–5.1)
Sodium: 136 mmol/L (ref 135–145)

## 2022-06-20 LAB — GLUCOSE, CAPILLARY
Glucose-Capillary: 151 mg/dL — ABNORMAL HIGH (ref 70–99)
Glucose-Capillary: 176 mg/dL — ABNORMAL HIGH (ref 70–99)
Glucose-Capillary: 236 mg/dL — ABNORMAL HIGH (ref 70–99)

## 2022-06-20 MED ORDER — GLIPIZIDE 5 MG PO TABS: 5.0000 mg | ORAL_TABLET | Freq: Every day | ORAL | 2 refills | Status: DC

## 2022-06-20 MED ORDER — ASPIRIN 81 MG PO TBEC: 81.0000 mg | DELAYED_RELEASE_TABLET | Freq: Every day | ORAL | 12 refills | Status: DC

## 2022-06-20 MED ORDER — POTASSIUM CHLORIDE CRYS ER 20 MEQ PO TBCR
40.0000 meq | EXTENDED_RELEASE_TABLET | Freq: Once | ORAL | Status: AC
  Administered 2022-06-20: 40 meq via ORAL
  Filled 2022-06-20: qty 2

## 2022-06-20 MED ORDER — CLOPIDOGREL BISULFATE 75 MG PO TABS: 75.0000 mg | ORAL_TABLET | Freq: Every day | ORAL | 0 refills | Status: DC

## 2022-06-20 MED ORDER — ATORVASTATIN CALCIUM 80 MG PO TABS: 80.0000 mg | ORAL_TABLET | Freq: Every day | ORAL | 2 refills | Status: AC

## 2022-06-20 MED ORDER — PANTOPRAZOLE SODIUM 40 MG PO TBEC: 40.0000 mg | DELAYED_RELEASE_TABLET | Freq: Every day | ORAL | Status: DC

## 2022-06-20 MED ORDER — METFORMIN HCL 500 MG PO TABS: 1000.0000 mg | ORAL_TABLET | Freq: Every day | ORAL | 2 refills | Status: DC

## 2022-06-20 MED ORDER — POTASSIUM CHLORIDE CRYS ER 20 MEQ PO TBCR: 20.0000 meq | EXTENDED_RELEASE_TABLET | Freq: Once | ORAL | Status: DC

## 2022-06-20 NOTE — Evaluation (Signed)
Speech Language Pathology Evaluation Patient Details Name: Theodore Hinton MRN: [id number] DOB: 1966-12-03 Today's Date: 06/20/2022 Time:  -     Problem List:  Patient Active Problem List   Diagnosis Date Noted   Stroke (HCC) 06/18/2022   Stroke (cerebrum) (HCC) 06/18/2022   Past Medical History:  Past Medical History:  Diagnosis Date   Back injury    Chronic back pain    Diabetes mellitus without complication (HCC)    Diverticulosis    Hypertension    Past Surgical History: History reviewed. No pertinent surgical history. HPI:  56 y.o. M who presents with onset of right sided facial droop, facial numbness, slurred speech and RUE weakness. Pt given IV tenecteplase. MRI shows Acute/subacute nonhemorrhagic 7 mm infarct in the lateral left  thalamus corresponds with patient's facial droop and right hand  numbness. Significant PMH: DM, stroke with residual right sided deficit, and HTN.   Assessment / Plan / Recommendation Clinical Impression  Pt demonstrates mild dysarthria and discomfort secondary to CN V sensory impairment. SLP reviewed articulation strategies, oral dysphagia strategies and methods to assist with home exercise program. Pt already compensating well. No f/u SLP needed will sign off    SLP Assessment  SLP Recommendation/Assessment: Patient does not need any further Speech Lanaguage Pathology Services    Recommendations for follow up therapy are one component of a multi-disciplinary discharge planning process, led by the attending physician.  Recommendations may be updated based on patient status, additional functional criteria and insurance authorization.    Follow Up Recommendations       Assistance Recommended at Discharge     Functional Status Assessment    Frequency and Duration           SLP Evaluation Cognition          Comprehension  Auditory Comprehension Overall Auditory Comprehension: Appears within functional limits for tasks assessed     Expression Verbal Expression Overall Verbal Expression: Appears within functional limits for tasks assessed   Oral / Motor  Oral Motor/Sensory Function Overall Oral Motor/Sensory Function: Within functional limits Motor Speech Overall Motor Speech: Impaired Articulation: Impaired Level of Impairment: Conversation            Jinger Middlesworth, Riley Nearing 06/20/2022, 3:28 PM

## 2022-06-20 NOTE — Discharge Summary (Addendum)
Stroke Discharge Summary  Patient ID: Theodore Hinton   MRN: [id number]      DOB: 1966/01/26  Date of Admission: 06/18/2022 Date of Discharge: 06/20/2022  Attending Physician: Dr. Antony Contras Consultant(s):    None  Patient's PCP:  Pcp, No  DISCHARGE DIAGNOSIS: Acute/subacute ischemic stroke in the lateral left thalamus  s/p tNK   Etiology:  small vessel disease  Principal Problem: Left subcortical stroke [facility]) Active Problems:   Stroke (cerebrum) ([place]) Right hemiparesis Right body paresthesias Uncontrolled diabetes Hyperlipidemia   Allergies as of 06/20/2022   No Known Allergies      Medication List     STOP taking these medications    diclofenac Sodium 1 % Gel Commonly known as: Voltaren   esomeprazole 40 MG capsule Commonly known as: NEXIUM   methocarbamol 500 MG tablet Commonly known as: ROBAXIN   naproxen 500 MG tablet Commonly known as: NAPROSYN   ondansetron 4 MG disintegrating tablet Commonly known as: Zofran ODT   ondansetron 4 MG tablet Commonly known as: ZOFRAN   oxyCODONE-acetaminophen 5-325 MG tablet Commonly known as: PERCOCET/ROXICET   predniSONE 10 MG tablet Commonly known as: DELTASONE       TAKE these medications    amLODipine 5 MG tablet Commonly known as: NORVASC Take 1 tablet (5 mg total) by mouth daily.   aspirin EC 81 MG tablet Take 1 tablet (81 mg total) by mouth daily. Swallow whole.   atorvastatin 80 MG tablet Commonly known as: LIPITOR Take 1 tablet (80 mg total) by mouth daily. Start taking on: June 21, 2022   clopidogrel 75 MG tablet Commonly known as: PLAVIX Take 1 tablet (75 mg total) by mouth daily.   cyclobenzaprine 10 MG tablet Commonly known as: FLEXERIL Take 10 mg by mouth 2 (two) times daily as needed for muscle spasms.   lisinopril 20 MG tablet Commonly known as: ZESTRIL Take 1 tablet (20 mg total) by mouth daily.   metFORMIN 500 MG tablet Commonly known as: GLUCOPHAGE Take 1 tablet (500  mg total) by mouth daily.        LABORATORY STUDIES CBC    Component Value Date/Time   WBC 11.5 (H) 06/18/2022 1720   RBC 6.06 (H) 06/18/2022 1720   HGB 19.2 (H) 06/18/2022 1720   HGB 15.3 02/28/2015 1552   HCT 53.8 (H) 06/18/2022 1720   HCT 45.3 02/28/2015 1552   PLT 235 06/18/2022 1720   PLT 208 02/28/2015 1552   MCV 88.8 06/18/2022 1720   MCV 89 02/28/2015 1552   MCH 31.7 06/18/2022 1720   MCHC 35.7 06/18/2022 1720   RDW 13.7 06/18/2022 1720   RDW 13.2 02/28/2015 1552   LYMPHSABS 2.1 06/18/2022 1720   MONOABS 0.7 06/18/2022 1720   EOSABS 0.2 06/18/2022 1720   BASOSABS 0.1 06/18/2022 1720   CMP    Component Value Date/Time   NA 136 06/20/2022 0921   NA 136 02/28/2015 1552   K 3.3 (L) 06/20/2022 0921   K 4.0 02/28/2015 1552   CL 106 06/20/2022 0921   CL 105 02/28/2015 1552   CO2 20 (L) 06/20/2022 0921   CO2 23 02/28/2015 1552   GLUCOSE 284 (H) 06/20/2022 0921   GLUCOSE 241 (H) 02/28/2015 1552   BUN 9 06/20/2022 0921   BUN 14 02/28/2015 1552   CREATININE 0.79 06/20/2022 0921   CREATININE 0.75 02/28/2015 1552   CALCIUM 8.4 (L) 06/20/2022 0921   CALCIUM 9.0 02/28/2015 1552   PROT 7.6 06/18/2022 1615  ALBUMIN 4.0 06/18/2022 1615   AST 38 06/18/2022 1615   ALT 27 06/18/2022 1615   ALKPHOS 72 06/18/2022 1615   BILITOT 2.2 (H) 06/18/2022 1615   GFRNONAA >60 06/20/2022 0921   GFRNONAA >60 02/28/2015 1552   GFRAA >60 02/04/2019 1401   GFRAA >60 02/28/2015 1552   COAGS Lab Results  Component Value Date   INR 1.0 06/18/2022   Lipid Panel    Component Value Date/Time   CHOL 171 06/19/2022 0316   TRIG 127 06/19/2022 0316   HDL 25 (L) 06/19/2022 0316   CHOLHDL 6.8 06/19/2022 0316   VLDL 25 06/19/2022 0316   LDLCALC 121 (H) 06/19/2022 0316   HgbA1C  Lab Results  Component Value Date   HGBA1C 10.3 (H) 06/19/2022   Urinalysis    Component Value Date/Time   COLORURINE YELLOW 06/18/2022 1858   APPEARANCEUR CLEAR 06/18/2022 1858   LABSPEC 1.010  06/18/2022 1858   PHURINE 6.5 06/18/2022 1858   GLUCOSEU >=500 (A) 06/18/2022 1858   HGBUR NEGATIVE 06/18/2022 1858   BILIRUBINUR NEGATIVE 06/18/2022 1858   KETONESUR 15 (A) 06/18/2022 1858   PROTEINUR NEGATIVE 06/18/2022 1858   UROBILINOGEN 1.0 06/26/2011 2113   NITRITE NEGATIVE 06/18/2022 1858   LEUKOCYTESUR NEGATIVE 06/18/2022 1858   Urine Drug Screen     Component Value Date/Time   LABOPIA POSITIVE (A) 06/18/2022 1857   COCAINSCRNUR NONE DETECTED 06/18/2022 1857   LABBENZ NONE DETECTED 06/18/2022 1857   AMPHETMU NONE DETECTED 06/18/2022 1857   THCU NONE DETECTED 06/18/2022 1857   LABBARB NONE DETECTED 06/18/2022 1857    Alcohol Level    Component Value Date/Time   ETH 18 (H) 06/18/2022 1615     SIGNIFICANT DIAGNOSTIC STUDIES MR BRAIN WO CONTRAST  Result Date: 06/19/2022 CLINICAL DATA:  Facial droop.  Right hand numbness. EXAM: MRI HEAD WITHOUT CONTRAST TECHNIQUE: Multiplanar, multiecho pulse sequences of the brain and surrounding structures were obtained without intravenous contrast. COMPARISON:  CT head and CTA head and neck 06/18/2022 FINDINGS: Brain: Diffusion-weighted images demonstrate acute/subacute nonhemorrhagic infarct in the lateral left thalamus measuring up to 7 mm. No other acute infarct is present. T2 signal scratched at T2 hyperintensity is associated with the subacute infarct. No acute hemorrhage is present. Periventricular and scattered subcortical T2 hyperintensities are mildly advanced for age. Mild generalized atrophy is present. The ventricles are of normal size. No significant extraaxial fluid collection is present. The internal auditory canals are within normal limits. Insert normal brainstem Vascular: Flow is present in the major intracranial arteries. Skull and upper cervical spine: Mild degenerative changes are present the upper cervical spine, most notably at C4-5. Craniocervical junction is within normal limits. Cerebellar tonsils extend to the foramen  magnum without Chiari malformation. Midline structures are otherwise normal. Sinuses/Orbits: Extensive sinus disease is present. Fluid levels are present in the sphenoid sinuses bilaterally. Diffuse opacification of ethmoid air cells is present. Mucosal thickening is present in the left frontal sinus. The right frontal sinus is not pneumatized. Mild mucosal thickening is present throughout the maxillary sinuses bilaterally. Bilateral mastoid effusions are present. No obstructing nasopharyngeal lesion is present. IMPRESSION: 1. Acute/subacute nonhemorrhagic 7 mm infarct in the lateral left thalamus corresponds with patient's facial droop and right hand numbness. 2. Periventricular and scattered subcortical T2 hyperintensities bilaterally are mildly advanced for age. The finding is nonspecific but can be seen in the setting of chronic microvascular ischemia, a demyelinating process such as multiple sclerosis, vasculitis, complicated migraine headaches, or as the sequelae of a prior infectious  or inflammatory process. 3. Extensive sinus disease as described. 4. Bilateral mastoid effusions. No obstructing nasopharyngeal lesion is present. These results will be called to the ordering clinician or representative by the Radiologist Assistant, and communication documented in the PACS or [employer]. Electronically Signed   By: Marin Roberts M.D.   On: 06/19/2022 17:43   ECHOCARDIOGRAM COMPLETE  Result Date: 06/19/2022    ECHOCARDIOGRAM REPORT   Patient Name:   JERNARD REIBER Date of Exam: 06/19/2022 Medical Rec #:  [id number]       Height:       68.0 in Accession #:    [id number]      Weight:       177.7 lb Date of Birth:  05-12-1966       BSA:          1.944 m Patient Age:    55 years        BP:           150/92 mmHg Patient Gender: M               HR:           61 bpm. Exam Location:  Inpatient Procedure: 2D Echo Indications:    stroke  History:        Patient has no prior history of Echocardiogram  examinations.                 Risk Factors:Hypertension and Diabetes.  Sonographer:    Delcie Roch RDCS Referring Phys: [phone] MCNEILL P KIRKPATRICK IMPRESSIONS  1. Left ventricular ejection fraction, by estimation, is 65 to 70%. The left ventricle has normal function. The left ventricle has no regional wall motion abnormalities. There is moderate focal hypetrophy of the basal septum. The rest of the LV segments  demonstrate mild left ventricular hypertrophy. Left ventricular diastolic parameters are consistent with Grade I diastolic dysfunction (impaired relaxation).  2. Right ventricular systolic function is normal. The right ventricular size is normal. Tricuspid regurgitation signal is inadequate for assessing PA pressure.  3. The mitral valve is normal in structure. Trivial mitral valve regurgitation.  4. The aortic valve is tricuspid. Aortic valve regurgitation is not visualized. Aortic valve sclerosis is present, with no evidence of aortic valve stenosis.  5. Aortic dilatation noted. There is borderline dilatation of the aortic root, measuring 37 mm. There is borderline dilatation of the ascending aorta, measuring 37 mm.  6. The inferior vena cava is normal in size with greater than 50% respiratory variability, suggesting right atrial pressure of 3 mmHg. Comparison(s): No prior Echocardiogram. Conclusion(s)/Recommendation(s): No intracardiac source of embolism detected on this transthoracic study. Consider a transesophageal echocardiogram to exclude cardiac source of embolism if clinically indicated. FINDINGS  Left Ventricle: Left ventricular ejection fraction, by estimation, is 65 to 70%. The left ventricle has normal function. The left ventricle has no regional wall motion abnormalities. The left ventricular internal cavity size was normal in size. There is  moderate focal hypetrophy of the basal septum. The rest of the LV segments demonstrate mild left ventricular hypertrophy. Left ventricular diastolic  parameters are consistent with Grade I diastolic dysfunction (impaired relaxation). Right Ventricle: The right ventricular size is normal. No increase in right ventricular wall thickness. Right ventricular systolic function is normal. Tricuspid regurgitation signal is inadequate for assessing PA pressure. Left Atrium: Left atrial size was normal in size. Right Atrium: Right atrial size was normal in size. Pericardium: There is no evidence of pericardial effusion. Mitral Valve: The  mitral valve is normal in structure. Trivial mitral valve regurgitation. Tricuspid Valve: The tricuspid valve is normal in structure. Tricuspid valve regurgitation is not demonstrated. Aortic Valve: The aortic valve is tricuspid. Aortic valve regurgitation is not visualized. Aortic valve sclerosis is present, with no evidence of aortic valve stenosis. Pulmonic Valve: The pulmonic valve was normal in structure. Pulmonic valve regurgitation is not visualized. Aorta: Aortic dilatation noted. There is borderline dilatation of the aortic root, measuring 37 mm. There is borderline dilatation of the ascending aorta, measuring 37 mm. Venous: The inferior vena cava is normal in size with greater than 50% respiratory variability, suggesting right atrial pressure of 3 mmHg. IAS/Shunts: The atrial septum is grossly normal.  LEFT VENTRICLE PLAX 2D LVIDd:         4.20 cm   Diastology LVIDs:         2.60 cm   LV e' medial:    4.79 cm/s LV PW:         1.20 cm   LV E/e' medial:  12.6 LV IVS:        1.20 cm   LV e' lateral:   6.85 cm/s LVOT diam:     2.40 cm   LV E/e' lateral: 8.8 LV SV:         104 LV SV Index:   53 LVOT Area:     4.52 cm  RIGHT VENTRICLE             IVC RV S prime:     14.10 cm/s  IVC diam: 1.70 cm TAPSE (M-mode): 2.0 cm LEFT ATRIUM             Index        RIGHT ATRIUM           Index LA diam:        3.80 cm 1.96 cm/m   RA Area:     15.00 cm LA Vol (A2C):   45.6 ml 23.46 ml/m  RA Volume:   35.90 ml  18.47 ml/m LA Vol (A4C):   55.6  ml 28.61 ml/m LA Biplane Vol: 50.4 ml 25.93 ml/m  AORTIC VALVE LVOT Vmax:   98.50 cm/s LVOT Vmean:  65.400 cm/s LVOT VTI:    0.229 m  AORTA Ao Root diam: 3.70 cm Ao Asc diam:  3.70 cm MITRAL VALVE MV Area (PHT): 2.29 cm    SHUNTS MV Decel Time: 332 msec    Systemic VTI:  0.23 m MV E velocity: 60.50 cm/s  Systemic Diam: 2.40 cm MV A velocity: 75.40 cm/s MV E/A ratio:  0.80 Gwyndolyn Kaufman MD Electronically signed by Gwyndolyn Kaufman MD Signature Date/Time: 06/19/2022/3:04:55 PM    Final    CT ANGIO HEAD NECK W WO CM (CODE STROKE)  Result Date: 06/18/2022 CLINICAL DATA:  Facial droop. Syncopal episode. Right hand numbness. EXAM: CT ANGIOGRAPHY HEAD AND NECK TECHNIQUE: Multidetector CT imaging of the head and neck was performed using the standard protocol during bolus administration of intravenous contrast. Multiplanar CT image reconstructions and MIPs were obtained to evaluate the vascular anatomy. Carotid stenosis measurements (when applicable) are obtained utilizing NASCET criteria, using the distal internal carotid diameter as the denominator. RADIATION DOSE REDUCTION: This exam was performed according to the departmental dose-optimization program which includes automated exposure control, adjustment of the mA and/or kV according to patient size and/or use of iterative reconstruction technique. CONTRAST:  42mL OMNIPAQUE IOHEXOL 350 MG/ML SOLN COMPARISON:  CT head without contrast 06/18/2022 and 06/17/2022 FINDINGS: CTA NECK  FINDINGS Aortic arch: 3 vessel arch configuration is present. No significant stenosis is present at the great vessel origins. Mild atherosclerotic changes are present in the more distal arch without aneurysm or stenosis. Right carotid system: The right common carotid artery is within normal limits. Bifurcation is unremarkable. Minimal mural calcification is present in the proximal right ICA. No significant stenosis is present. More distal right ICA is normal. Left carotid system: The  left common carotid artery is within normal limits. Mild atherosclerotic changes are present bifurcation. No significant stenosis is present. Cervical left ICA is normal. Vertebral arteries: The vertebral arteries are codominant. Both vertebral arteries originate from the subclavian arteries. Calcifications present at the origin left subclavian artery. 50% stenosis is proximal at both origins. No tandem stenoses are present in either vertebral artery in the neck. Skeleton: Mild degenerative changes of the cervical spine are most noted at C4-5. Vertebral body heights and alignment are otherwise normal. No focal osseous lesions are present. Other neck: Soft tissues the neck are otherwise unremarkable. Salivary glands are within normal limits. Thyroid is normal. No significant adenopathy is present. No focal mucosal or submucosal lesions are present. Upper chest: Mild dependent atelectasis present. Lung apices are otherwise clear. The thoracic inlet is within normal limits. Review of the MIP images confirms the above findings CTA HEAD FINDINGS Anterior circulation: Atherosclerotic calcifications are present within the cavernous internal carotid arteries bilaterally. No significant stenosis is present through the ICA terminus. Moderate narrowing is present in the proximal left M1 segment. More mild narrowing is present in the proximal right M1 segment. MCA bifurcations are within normal limits bilaterally. A1 segments are normal bilaterally. The ACA and MCA branch vessels are within normal limits bilaterally. Posterior circulation: PICA origins are visualized and within normal limits. The vertebrobasilar junction and basilar artery are normal. Both posterior cerebral arteries originate from basilar tip. High-grade stenosis is present in the proximal right P2 segment. Additional segmental narrowing is present. Mild distal P2 segment stenosis is present on the left. More distal PCA branch vessels are within normal limits.  Venous sinuses: The dural sinuses are patent. The straight sinus deep cerebral veins are intact. Cortical veins are within normal limits. No significant vascular malformation is evident. Anatomic variants: None Review of the MIP images confirms the above findings IMPRESSION: 1. Moderate left M1 segment stenosis. Distal MCA branch vessels fill normally. 2. High-grade stenosis of the proximal right P2 segment with attenuation of more distal right-sided vessels. 3. Mild narrowing of the proximal right M1 segment. 4. Mild atherosclerotic changes at the carotid bifurcations bilaterally without significant stenosis. 5. 50% stenosis at the origins of both vertebral arteries. 6. Mild degenerative changes of the cervical spine are most noted at C4-5. These results were called by telephone at the time of interpretation on 06/18/2022 at 7:13 pm to provider Dr. Alvino Chapel, who verbally acknowledged these results. Electronically Signed   By: San Morelle M.D.   On: 06/18/2022 19:13   CT HEAD CODE STROKE WO CONTRAST  Result Date: 06/18/2022 CLINICAL DATA:  Code stroke. Right facial droop beginning 30 minutes ago. Right hand numbness. EXAM: CT HEAD WITHOUT CONTRAST TECHNIQUE: Contiguous axial images were obtained from the base of the skull through the vertex without intravenous contrast. RADIATION DOSE REDUCTION: This exam was performed according to the departmental dose-optimization program which includes automated exposure control, adjustment of the mA and/or kV according to patient size and/or use of iterative reconstruction technique. COMPARISON:  CT head without contrast 06/17/2022 FINDINGS: Brain: No  acute infarct, hemorrhage, or mass lesion is present. The ventricles are of normal size. Coronal fissure cyst again noted on the right. Mild white matter hypoattenuation is stable. No significant extraaxial fluid collection is present. The brainstem and cerebellum are within normal limits. Vascular: Atherosclerotic  calcifications are present within the cavernous internal carotid arteries and at the dural margin of both vertebral arteries. Skull: Calvarium is intact. No focal lytic or blastic lesions are present. No significant extracranial soft tissue lesion is present. Sinuses/Orbits: Fluid is present in the sphenoid sinuses bilaterally. Scattered mucosal thickening is present throughout the ethmoid air cells. 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 Mastoid air cells are clear bilaterally. The globes and orbits are within normal limits. ASPECTS [facility] Stroke Program Early CT Score) - Ganglionic level infarction (caudate, lentiform nuclei, internal capsule, insula, M1-M3 cortex): 7/7 - Supraganglionic infarction (M4-M6 cortex): 3/3 Total score (0-10 with 10 being normal): 10/10 IMPRESSION: 1. No acute intracranial abnormality or significant interval change. 2. Stable mild white matter disease. 3. ASPECTS is 10/10. These results were called by telephone at the time of interpretation on 06/18/2022 at 4:42 pm to provider Dr. Alvino Chapel, who verbally acknowledged these results. Electronically Signed   By: San Morelle M.D.   On: 06/18/2022 16:42   CT Head Wo Contrast  Result Date: 06/17/2022 CLINICAL DATA:  Headache. EXAM: CT HEAD WITHOUT CONTRAST TECHNIQUE: Contiguous axial images were obtained from the base of the skull through the vertex without intravenous contrast. RADIATION DOSE REDUCTION: This exam was performed according to the departmental dose-optimization program which includes automated exposure control, adjustment of the mA and/or kV according to patient size and/or use of iterative reconstruction technique. COMPARISON:  February 28, 2015 FINDINGS: Brain: No evidence of acute infarction, hemorrhage, hydrocephalus, extra-axial collection or mass lesion/mass effect. Vascular: Calcified atherosclerotic changes are seen in the intracranial  carotids. Skull: Normal. Negative for fracture or focal lesion. Sinuses/Orbits: Opacification most of the ethmoid air cells. Fluid identified in the sphenoid sinuses. Mucosal thickening identified in the maxillary sinuses. There is a small amount of fluid in the inferior right mastoid air cells without bony erosion. There may be some bony sclerosis in this region suggesting this may be a nonacute finding. Other: None. IMPRESSION: 1. Sinus disease as above. There is also small amount of fluid in the right mastoid air cells without bony erosion or overlying soft tissue inflammation. 2. No acute intracranial abnormalities. Electronically Signed   By: Dorise Bullion III M.D.   On: 06/17/2022 17:06   DG Chest Portable 1 View  Result Date: 06/17/2022 CLINICAL DATA:  nausea and dizziness starting today. Diab and htn. Hx mini strokes. chest pain EXAM: PORTABLE CHEST 1 VIEW COMPARISON:  None Available. FINDINGS: Normal mediastinum and cardiac silhouette. Normal pulmonary vasculature. No evidence of effusion, infiltrate, or pneumothorax. No acute bony abnormality. IMPRESSION: No acute cardiopulmonary process. Electronically Signed   By: Suzy Bouchard M.D.   On: 06/17/2022 16:43      HISTORY OF PRESENT ILLNESS Ignace Takemura is a 56 y.o. male who reports that around 1550 he had onset of right-sided facial droop right-sided facial numbness, slurred speech and right arm weakness.  He says that he felt lightheaded, and saw spots similar to what happens if you stand up too fast.  He then noticed that there was a numbing/tingling feeling of his right side.  Due to the symptoms he sought care at [facility] on their was evaluated  by Dr. Cheral Marker who recommended IV tenecteplase which was given.  Patient showed significant improvement shortly thereafter with improvement in right-sided strength but mild paresthesias persisted.  He was admitted to the ICU where blood pressure was tightly controlled.  Did well.   MRI scan confirmed small left thalamic subcortical infarct without any hemorrhagic transformation.  Echocardiogram showed normal ejection fraction without cardiac source of embolism.  LDL cholesterol slightly elevated 121 mg percent and hemoglobin A1c at 10.3.  Patient is started on dual antiplatelet therapy aspirin and Plavix for 3 weeks followed by aspirin alone for life.  He was advised to see his primary care physician for more aggressive diabetes control started on statin for elevated lipids  HOSPITAL COURSE Etiology:  small vessel disease  Code Stroke CT head No acute abnormality. ASPECTS 10. Stable mild white matter disease   CTA head & neck  1. Moderate left M1 segment stenosis. Distal MCA branch vessels fill normally. 2. High-grade stenosis of the proximal right P2 segment with attenuation of more distal right-sided vessels. 3. Mild narrowing of the proximal right M1 segment. 4. Mild atherosclerotic changes at the carotid bifurcations bilaterally without significant stenosis. 5. 50% stenosis at the origins of both vertebral arteries. 6. Mild degenerative changes of the cervical spine are most noted at C4-5.   MRI Acute/subacute nonhemorrhagic 7 mm infarct in the lateral left thalamus corresponds with patient's facial droop and right hand numbness. 2D Echo  EF -[id number], Grade 1 diastolic dysfunction noted, No shunt, No thrombus  LDL 121 HgbA1c 10.3 VTE prophylaxis - SCD's       Diet    Diet regular Room service appropriate? Yes; Fluid consistency: Thin        No antithrombotic prior to admission, now on No antithrombotic. If no hemorrhage on 24 hr post TNK brain imaging will start ASA and Plavix Therapy recommendations:  Outpatient PT and OT Disposition:  Home   Hypertension Home meds:  norvasc and lisinopril Stable Long-term BP goal normotensive   Hyperlipidemia Home meds:  none LDL 121, goal < 70 Add atorvastatin 80 mg   Continue statin at discharge   Diabetes type  II UnControlled Home meds:  metformin  HgbA1c 10.3, goal < 7.0 CBGs Recent Labs (last 2 labs)        Recent Labs    06/19/22 0036 06/19/22 0337 06/19/22 0729  GLUCAP 268* 121* 158*      SSI Will need close outpatient monitoring with PCP   Other Stroke Risk Factors ETOH use, alcohol level 18, advised to drink no more than 1 drink(s) a day   Other Active Problems Chronic back pain    DISCHARGE EXAM Blood pressure (!) 166/89, pulse 64, temperature 98.5 F (36.9 C), temperature source Oral, resp. rate 17, weight 80.6 kg, SpO2 (!) 88 %. O2 sat was normal on recheck at 96%.  Pateint is alert and oriented x4, no aphasia, no dysarthria, right facial droop. EOMI, tracks, visual field full, uppers 5/5, right grip weakness, right weakness on hand orbiting, diminished fine finger movements on the right.  No drift, bilateral lowers 4/5 due to back pain. No sensation deficits.  Mild subjective paresthesias around the right lip and cheek.  No ataxia.  Discharge Diet       Diet   Diet Carb Modified Fluid consistency: Thin; Room service appropriate? Yes   liquids  DISCHARGE PLAN Disposition:  Home  DAPT with ASA 81 and Plavix x 3 weeks then ASA 81mg  as monotherapy for life.  Ongoing stroke risk factor control by Primary Care Provider at time of discharge- Discharge Instructions     Ambulatory referral to Neurology   Complete by: As directed    An appointment is requested in approximately: 8 weeks   Ambulatory referral to Occupational Therapy   Complete by: As directed    Ambulatory referral to Physical Therapy   Complete by: As directed       advised to establish with PCP immediately if not currently under primary care Follow-up PCP in 2 weeks. Follow-up in [facility] in 8 weeks, office to schedule an appointment.   35 minutes were spent preparing discharge. This patient was seen and evaluated with Dr. Pearlean Brownie. He directed the plan of care.   Shon Hale, NP-C    I have personally obtained history,examined this patient, reviewed notes, independently viewed imaging studies, participated in medical decision making and plan of care.ROS completed by me personally and pertinent positives fully documented  I have made any additions or clarifications directly to the above note. Agree with note above.    Delia Heady, MD Medical Director [facility] Pager: [phone] 06/20/2022 2:15 PM

## 2022-06-20 NOTE — Inpatient Diabetes Management (Signed)
Inpatient Diabetes Program Recommendations  AACE/ADA: New Consensus Statement on Inpatient Glycemic Control (2015)  Target Ranges:  Prepandial:   less than 140 mg/dL      Peak postprandial:   less than 180 mg/dL (1-2 hours)      Critically ill patients:  140 - 180 mg/dL   Lab Results  Component Value Date   GLUCAP 236 (H) 06/20/2022   HGBA1C 10.3 (H) 06/19/2022    Review of Glycemic Control  Latest Reference Range & Units 06/20/22 03:09 06/20/22 07:34 06/20/22 11:39  Glucose-Capillary 70 - 99 mg/dL 295 (H) 621 (H) 308 (H)  (H): Data is abnormally high Diabetes history: Type 2 DM Outpatient Diabetes medications: Metformin 500 mg QD Current orders for Inpatient glycemic control: Novolog 0-15 units Q4H  Inpatient Diabetes Program Recommendations:    Consider increasing Metformin 1000 mg BID and adding Glipizide 5 mg QD.  Will plan to see prior to discharge.   Thanks, Lujean Rave, MSN, RNC-OB Diabetes Coordinator [phone] (8a-5p)

## 2022-06-20 NOTE — Discharge Instructions (Addendum)
Follow up closely with your primary care provider within 2 weeks for continued management of your stroke risk factors. If you do not have a primary care provider you are advised to immediately establish with one.

## 2022-06-21 NOTE — Therapy (Incomplete)
OUTPATIENT PHYSICAL THERAPY NEURO EVALUATION   Patient Name: Theodore Hinton MRN: [id number] DOB:[birth year], 56 y.o., male Today's Date: 06/22/2022   PCP:  None, working to get established with PCP REFERRING PROVIDER: Janey Genta, NP    PT End of Session - 06/22/22 1106     Visit Number 1    Number of Visits 16    Date for PT Re-Evaluation 08/21/22    Authorization Type cigna    PT Start Time 1106    PT Stop Time 1145    PT Time Calculation (min) 39 min    Activity Tolerance Patient tolerated treatment well    Behavior During Therapy WFL for tasks assessed/performed             Past Medical History:  Diagnosis Date   Back injury    Chronic back pain    Diabetes mellitus without complication (HCC)    Diverticulosis    Hypertension    History reviewed. No pertinent surgical history. 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 male s/p CVA 06/18/22 presenting with R UE weakness, facial droop, facial numbness, slurred speech. Pt given IV tenecteplase.   He reports his walking abilities are good.  His tongue and mouth are still numb and he has sensory changes on the R side.  He describes his right side as a sponge when he walks and is unable to feel the ground the same. He reports a little difficulty with  swallowing due to numbness "like I ate a mouth full of oragel."    Pt accompanied by: self  PERTINENT HISTORY: Significant PMH: DM, stroke with residual right sided deficit, and HTN.  PAIN:  Are you having pain? Yes: NPRS scale: 10/10 Pain location: low back Pain description: notes he has needed surgery;  sharp pain, constant in nature Aggravating factors: laying down Relieving factors: better with gentle walking  PRECAUTIONS: Fall; h/o chronic back pain  WEIGHT BEARING RESTRICTIONS No  FALLS: Has patient fallen in last  6 months? 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  "I feel like I'm pretty good as far as mobility."  OBJECTIVE:   DIAGNOSTIC FINDINGS: MRI shows Acute/subacute nonhemorrhagic 7 mm infarct in the lateral left  thalamus corresponds with patient's facial droop and right hand  numbness.  COGNITION: Overall cognitive status: Within functional limits for tasks assessed   SENSATION: Light touch: Impaired  Worse for oral sensation of numbness in tongue and is biting his tongue some. He also notices some drooling at times b/c he cannot feel it. L DOMINANT HAND  COORDINATION: Rapid alternating movements are reduced on R side, Finger to nose equal, but slowed bilaterally. Heel to shin reduced on R side due to weakness.  LOWER EXTREITY A/ROM:   WFLs per observation  LOWER EXTREMITY MMT:    MMT Right Eval Left Eval  Hip flexion 3+5 4+/5  Hip extension    Hip abduction    Hip adduction    Hip internal rotation    Hip external rotation    Knee  flexion 4-/5 5/5  Knee extension 4-/5 4/5  Ankle dorsiflexion 3+/5 5/5  Ankle plantarflexion    Ankle inversion    Ankle eversion    (Blank rows = not tested)  BED MOBILITY:  Independent per patient report  TRANSFERS: Assistive device utilized: None  Sit to stand: Modified independence Stand to sit: Modified independence Chair to chair: Modified independence Floor:  n/a  STAIRS:  Level of Assistance: Modified independence  Stair Negotiation Technique: Step to Pattern with Single Rail on Left  Number of Stairs: 4    Comments: The patient was instructed in sequencing for "up with good, down with bad" due to R knee weakness noted.  GAIT: Gait pattern: decreased step length- Left, decreased stance time- Right, and decreased ankle dorsiflexion- Right Distance walked: 2.503 ft/sec Assistive device utilized: None Level of assistance: Modified independence Comments: slowed pace, some lateral trunk lean due to glut med weakness  FUNCTIONAL TESTs:  5 times sit to stand: 30.14 with UE support -- increases back pain Berg Balance Scale: 40/56   Berg Balance Scale:  Sitting to Standing (4): able to stand w/o using hands and stabilize I (3): able to stand I using hands (2): able to stand using hands after several tries (1): needs Min aid to stand or stabilize (0): needs ModA to MaxA to stand SCORE: 3  Standing Unsupported (4): able to stand safely for 2 mins w/o holding on (3): able to stand 2 mins w/supervision (2): able to stand 30 secs. Unsupported (1): needs several tries to stand 30 secs unsupported (0): unable to stand 30 secs unsupported SCORE: 4  Sitting Unsupported (4): able to sit safely and securely for 2 mins (3): able to sit 2 mins w/supervision (2): able to sit 30 secs (1): able to sit 10 secs (0): unable to sit unsupported SCORE: 4  Standing to Sitting (4): sits safely w/min use of hands (3): controls descent w/hands (2): uses backs of legs against chair to  control descent (1): sits I but has uncontrolled descent (0): needs assistance  to sit SCORE: 4  Transfers (4): able to transfer safely w/minor use of hands (3): able to transfer safely definite need of hands (2): able to transfer w/verbal cues and/or supervision (1): needs 1 person to assist (0): needs 2 people to assist or supervise to be safe SCORE: 3  Standing Unsupported w/ Eyes Closed (4): able to stand 10 secs safely (3): able to stand 10 secs w/supervision (2): able to stand 3 secs (1): unable to keep eyes closed 3 secs but stays safely (0): needs help to keep from falling SCORE: 3  Standing Unsupported w/Feet Together (4): able to place feet together I and stand 1 min safely (3): able to place feet together I and stand 1 min w/supervision (2): able to place feet together I but unable to hold 30 secs (1): needs help to attain position but able to stand 15 secs feet together (0): needs help to attain position and unable to hold for 15 secs SCORE: 1  Reaching Fwd while Standing (4): reach fwd 25cm/10in (3): reach fwd 12cm/5in (2): reach fwd 5cm/2in (1): reach fwd but needs supervision (0): loses balance while trying/requires external support SCORE: 3  Pick Up Object from Floor while Standing (4): able to pick up safely and easily (3): able to pick up but needs supervision (2): unable to pick up but reaches 1-2 in from object and keeps balance I (1): unable to pick up and needs supervision while trying (0): needs assist to keep from losing balance or falling SCORE: 4  Turning to Look Behind L and R Shoulders Standing (4): looks behind from both sides and weight shifts well (3): looks behind one side only other side shows less weight shift (2): turns sideways only but maintains balance (1): needs supervision while turning (0): needs assistance to keep from losing balance or falling SCORE: 4  Turn 360 Degrees (4): able to turn 360 degrees safely in <4 secs (3): able to turn  360 degrees safely on one side only 4 secs or less (2): able to turn 360 degrees safely but slowly (1): needs close supervision or verbal cues (0): needs assistance while turning SCORE: 2  Stair Taps (4): able to stand I and safely and complete 8 steps in 20 secs (3): able to stand I and complete 8 steps in >20 seconds (2): able to complete 4 steps w/o aid w/supervision  (1): able to complete >2 steps needs MinA (0): needs assistance to keep from falling/unable to try SCORE: 2  Standing Unsupported One Foot in Front (tandem) (4): able to place foot tandem I and hold 30 seconds (3): able to place foot ahead I and hold 30 seconds (2): able to take small step I and hold 30 seconds (1): needs help to step but can hold 15 seconds (0): loses balance while stepping or standing SCORE: 2  Standing on One Leg (4): able to lift leg I and hold>10 seconds (3): able to lift leg I and hold 5-10 seconds (2): able to lift leg I and hold >3 seconds (1): tries to left leg unable to hold 3 secs but remains standing I (0): unable to try/needs assistance to prevent fall SCORE: 1  TODAY'S TREATMENT:  06/21/2022 There Ex:  Sit<>stand x 10 reps, standing heel/toe raises with UE support x 10 reps bilat, single limb stance and marching near support surface. Self:  Educated patient on need for ST and OT services and need to establish care with primary care MD.  PATIENT EDUCATION: Education details: patient Person educated: Patient Education method: Explanation, Facilities manager, and Handouts Education comprehension: verbalized understanding, returned demonstration, and needs further education   HOME EXERCISE PROGRAM: Access Code: [email or web address] URL: [email or web address] Date: 06/22/2022 Prepared by: Margretta Ditty  Exercises - Heel Toe Raises with Counter Support  - 2 x daily - 7 x weekly - 1 sets - 10 reps - Standing Marching  - 2 x daily - 7 x weekly - 1 sets - 10 reps - Standing Single  Leg Stance with Unilateral Counter Support  - 2 x daily - 7 x weekly - 1 sets - 3 reps - 10-15 seconds hold    GOALS: Goals reviewed with patient? Yes  SHORT TERM GOALS: Target date: 07/20/2022  The patient will be indep with initial HEP.  Baseline: no HEP Goal status: INITIAL  2.  The patient will improve Berg Balance Score from 40/56 to 46/56 to demo dec'd risk for falls. Baseline: 40/56 Goal status: INITIAL  3.  The patient will improve 5 time sit to stand from 30 seconds to < or equal to 22 seconds. Baseline: 30 seconds Goal status: INITIAL  4.  The patient will negotiate steps with reciprocal pattern and one handrail mod indep to access basement. Baseline:  step to pattern and not going to basement in home. Goal status: INITIAL  LONG TERM GOALS: Target date: 08/17/2022  The patient will be indep with HEP progression Baseline:  no HEP Goal status: INITIAL  2.  The patient will improve gait speed to > or equal to 3.0 ft/sec Baseline: 2.5 ft/sec Goal status: INITIAL  3.  The patient will improve Berg score to > or equal to 50/56. Baseline: 40/56. Goal status: INITIAL  4.  The patient will get up/down from floor and demo crawling due to return to work in heat/air. Baseline: none. Goal status: INITIAL ASSESSMENT:  CLINICAL IMPRESSION: Patient is a 56 y.o. male who was seen today for physical therapy evaluation and treatment for acute stroke. Below listed impairments lead to functional limitations in household ambulation, community ambulation, return to work, ability to perform IADLs.  In addition to physical therapy needs, the patient is also referred for OT.  I recommended and discussed need to establish with primary care to manage chronic health conditions and also get order for ST.    OBJECTIVE IMPAIRMENTS Abnormal gait, decreased activity tolerance, decreased balance, decreased endurance, decreased mobility, difficulty walking, decreased ROM, decreased strength, and  decreased safety awareness.   ACTIVITY LIMITATIONS lifting, bending, standing, squatting, and stairs  PARTICIPATION LIMITATIONS: cleaning, driving, community activity, occupation, and yard work  PERSONAL FACTORS 1-2 comorbidities: HTN, diabetes and chronic health condition mgmt (significant back pain)  are also affecting patient's functional outcome.   REHAB POTENTIAL: Good  CLINICAL DECISION MAKING: Stable/uncomplicated  EVALUATION COMPLEXITY: Low  PLAN: PT FREQUENCY: 1-2x/week  PT DURATION: 8 weeks  PLANNED INTERVENTIONS: Therapeutic exercises, Therapeutic activity, Neuromuscular re-education, Balance training, Gait training, Patient/Family education, Self Care, and Joint mobilization  PLAN FOR NEXT SESSION: Progress HEP, gait training, endurance, high level balance, R LE strengthening.  *Monitor chronic back pain. schedule at [facility] adding OT (per referral).  Pt to request ST order once establishes with PCP or neurology.     Osias Resnick, PT 06/22/2022, 2:34 PM

## 2022-06-22 ENCOUNTER — Encounter: Payer: Self-pay | Admitting: Rehabilitative and Restorative Service Providers"

## 2022-06-22 ENCOUNTER — Ambulatory Visit
Payer: Managed Care, Other (non HMO) | Attending: Adult Health | Admitting: Rehabilitative and Restorative Service Providers"

## 2022-06-22 ENCOUNTER — Other Ambulatory Visit: Payer: Self-pay

## 2022-06-22 DIAGNOSIS — R278 Other lack of coordination: Secondary | ICD-10-CM | POA: Diagnosis not present

## 2022-06-22 DIAGNOSIS — R2689 Other abnormalities of gait and mobility: Secondary | ICD-10-CM | POA: Insufficient documentation

## 2022-06-22 DIAGNOSIS — M6281 Muscle weakness (generalized): Secondary | ICD-10-CM | POA: Diagnosis not present

## 2022-06-22 DIAGNOSIS — I639 Cerebral infarction, unspecified: Secondary | ICD-10-CM | POA: Diagnosis not present

## 2022-06-22 DIAGNOSIS — R2681 Unsteadiness on feet: Secondary | ICD-10-CM | POA: Insufficient documentation

## 2022-06-27 ENCOUNTER — Ambulatory Visit: Payer: Self-pay

## 2022-06-27 ENCOUNTER — Other Ambulatory Visit: Payer: Self-pay

## 2022-06-27 NOTE — Patient Outreach (Signed)
[facility]) Care Management  06/27/2022  Micky Sheller Mar 20, 1966 [id number]    EMMI-STROKE RED ON EMMI ALERT Day # 3 Date: 06/26/2022 Red Alert Reason: "Feeling worse overall? Yes"   Outreach attempt #1 to patient. Spoke with patient who voices he is doing fairly well. Reviewed and addressed red alert. Patient reports he continues to have some drooling from his mouth which is worrisome to him. He has mentioned it to his care team including outpt rehab and been referred to speech therapy. He has stroke MD appt 09/08/22. Patient confirms that he has established care with a new PCP and saw them last week and goes back for follow up in 3wks. He confirms he has all his meds and no issues regarding them. He denies any RN CM needs or concerns at this time. Advised patient that they would continue to get automated EMMI-Stroke post discharge calls to assess how they are doing following recent hospitalization and will receive a call from a nurse if any of their responses were abnormal. Patient voiced understanding and was appreciative of f/u call.     Plan: RN CM will close case.  Antionette Fairy, RN,BSN,CCM [facility] Care Management Telephonic Care Management Coordinator Direct Phone: [phone] Toll Free: [phone] Fax: [phone]

## 2022-07-01 ENCOUNTER — Ambulatory Visit: Payer: Managed Care, Other (non HMO)

## 2022-07-01 ENCOUNTER — Ambulatory Visit: Payer: Managed Care, Other (non HMO) | Admitting: Occupational Therapy

## 2022-07-01 DIAGNOSIS — I69353 Hemiplegia and hemiparesis following cerebral infarction affecting right non-dominant side: Secondary | ICD-10-CM

## 2022-07-01 DIAGNOSIS — M6281 Muscle weakness (generalized): Secondary | ICD-10-CM

## 2022-07-01 DIAGNOSIS — R2689 Other abnormalities of gait and mobility: Secondary | ICD-10-CM

## 2022-07-01 DIAGNOSIS — R278 Other lack of coordination: Secondary | ICD-10-CM

## 2022-07-01 DIAGNOSIS — I639 Cerebral infarction, unspecified: Secondary | ICD-10-CM | POA: Diagnosis not present

## 2022-07-01 DIAGNOSIS — R2681 Unsteadiness on feet: Secondary | ICD-10-CM

## 2022-07-01 NOTE — Therapy (Signed)
OUTPATIENT PHYSICAL TREATMENT NOTE   Patient Name: Theodore Hinton MRN: [id number] DOB:1966-11-06, 56 y.o., male Today's Date: 07/01/2022   PCP:  None, working to get established with PCP REFERRING PROVIDER: Hetty Blend, NP    PT End of Session - 07/01/22 1056     Visit Number 2    Number of Visits 16    Date for PT Re-Evaluation 08/21/22    Authorization Type cigna    PT Start Time 1105    PT Stop Time 1150    PT Time Calculation (min) 45 min    Activity Tolerance Patient tolerated treatment well    Behavior During Therapy WFL for tasks assessed/performed             Past Medical History:  Diagnosis Date   Back injury    Chronic back pain    Diabetes mellitus without complication ([place])    Diverticulosis    Hypertension    History reviewed. No pertinent surgical history. 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    ONSET DATE: 06/20/2022 I63.9 (ICD-10-CM) - Cerebrovascular accident (CVA), unspecified mechanism ([place])   REFERRING DIAG: I63.9 (ICD-10-CM) - Cerebrovascular accident (CVA), unspecified mechanism ([place])   THERAPY DIAG:  Other abnormalities of gait and mobility  Muscle weakness (generalized)  Rationale for Evaluation and Treatment  Rehablitation   SUBJECTIVE:                                                                                                                                                                                              SUBJECTIVE STATEMENT: Been working but trying not to do as much of the service nor climb on ladders    Pt accompanied by: self  PERTINENT HISTORY: Significant PMH: DM, stroke with residual right sided deficit, and HTN.  PAIN:  Are you having pain? Yes: NPRS scale: 8/10 Pain location: low back Pain description: notes he has needed surgery;  sharp pain, constant in nature Aggravating factors: laying down Relieving  factors: better with gentle walking  PRECAUTIONS: Fall; h/o chronic back pain  WEIGHT BEARING RESTRICTIONS No  FALLS: Has patient fallen in last 6 months? 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  "I feel like I'm pretty good as far as mobility."  OBJECTIVE:  Vital signs: 169/95 mmHg, 85 bpm at start of tx           149/105 mmHg, 93 bpm at end of session     TODAY'S TREATMENT: 07/01/22 Activity Comments  Single leg balance 3 trials  Difficulty LLE 2-4 sec, 10 sec RLE  Tandem stance  30 sec  Walking outside For speed, walk on toes, heels, grassy area  Standing on foam -EO 2 x 30 sec -slow march 1x10 -EO + head turns/vertical 5x -EC + head turns/vertical 5x  Pt education regarding cardiovascular exercise and its benefits Recommendation of stationary cycling or equivalent 150 min/week moderate-intensity aerobic activity or 75 minutes per week of vigorous aerobic activity, or a combination of both, preferably spread throughout the week.       DIAGNOSTIC FINDINGS: MRI shows Acute/subacute nonhemorrhagic 7 mm infarct in the lateral left  thalamus corresponds with patient's facial droop and right hand  numbness.  COGNITION: Overall cognitive status: Within functional limits for tasks assessed   SENSATION: Light touch: Impaired  Worse for oral sensation of numbness in tongue and is biting his tongue some. He also notices some drooling at times b/c he cannot feel it. L DOMINANT HAND  COORDINATION: Rapid alternating movements are reduced on R side, Finger to nose equal, but  slowed bilaterally. Heel to shin reduced on R side due to weakness.  LOWER EXTREITY A/ROM:   WFLs per observation  LOWER EXTREMITY MMT:    MMT Right Eval Left Eval  Hip flexion 3+5 4+/5  Hip extension    Hip abduction    Hip adduction    Hip internal rotation    Hip external rotation    Knee flexion 4-/5 5/5  Knee extension 4-/5 4/5  Ankle dorsiflexion 3+/5 5/5  Ankle plantarflexion    Ankle inversion    Ankle eversion    (Blank rows = not tested)  BED MOBILITY:  Independent per patient report  TRANSFERS: Assistive device utilized: None  Sit to stand: Modified independence Stand to sit: Modified independence Chair to chair: Modified independence Floor:  n/a  STAIRS:  Level of Assistance: Modified independence  Stair Negotiation Technique: Step to Pattern with Single Rail on Left  Number of Stairs: 4    Comments: The patient was instructed in sequencing for "up with good, down with bad" due to R knee weakness noted.  GAIT: Gait pattern: decreased step length- Left, decreased stance time- Right, and decreased ankle dorsiflexion- Right Distance walked: 2.503 ft/sec Assistive device utilized: None Level of assistance: Modified independence Comments: slowed pace, some lateral trunk lean due to glut med weakness  FUNCTIONAL TESTs:  5 times sit to stand: 30.14 with UE support -- increases back pain Berg Balance Scale: 40/56   Berg Balance Scale:  Sitting to Standing (4): able to stand w/o using hands and stabilize I (3): able to stand I using hands (2): able to stand using hands after several tries (1): needs Min aid to stand or stabilize (0): needs ModA to MaxA to stand SCORE: 3  Standing Unsupported (4): able to stand safely for 2 mins w/o holding on (3): able to stand 2 mins w/supervision (2): able to stand 30 secs. Unsupported (1): needs several tries to stand 30 secs unsupported (0): unable to stand 30 secs unsupported SCORE: 4  Sitting Unsupported (4):  able to sit safely and securely for  2 mins (3): able to sit 2 mins w/supervision (2): able to sit 30 secs (1): able to sit 10 secs (0): unable to sit unsupported SCORE: 4  Standing to Sitting (4): sits safely w/min use of hands (3): controls descent w/hands (2): uses backs of legs against chair to control descent (1): sits I but has uncontrolled descent (0): needs assistance to sit SCORE: 4  Transfers (4): able to transfer safely w/minor use of hands (3): able to transfer safely definite need of hands (2): able to transfer w/verbal cues and/or supervision (1): needs 1 person to assist (0): needs 2 people to assist or supervise to be safe SCORE: 3  Standing Unsupported w/ Eyes Closed (4): able to stand 10 secs safely (3): able to stand 10 secs w/supervision (2): able to stand 3 secs (1): unable to keep eyes closed 3 secs but stays safely (0): needs help to keep from falling SCORE: 3  Standing Unsupported w/Feet Together (4): able to place feet together I and stand 1 min safely (3): able to place feet together I and stand 1 min w/supervision (2): able to place feet together I but unable to hold 30 secs (1): needs help to attain position but able to stand 15 secs feet together (0): needs help to attain position and unable to hold for 15 secs SCORE: 1  Reaching Fwd while Standing (4): reach fwd 25cm/10in (3): reach fwd 12cm/5in (2): reach fwd 5cm/2in (1): reach fwd but needs supervision (0): loses balance while trying/requires external support SCORE: 3  Pick Up Object from Floor while Standing (4): able to pick up safely and easily (3): able to pick up but needs supervision (2): unable to pick up but reaches 1-2 in from object and keeps balance I (1): unable to pick up and needs supervision while trying (0): needs assist to keep from losing balance or falling SCORE: 4  Turning to Look Behind L and R Shoulders Standing (4): looks behind from both sides and weight shifts  well (3): looks behind one side only other side shows less weight shift (2): turns sideways only but maintains balance (1): needs supervision while turning (0): needs assistance to keep from losing balance or falling SCORE: 4  Turn 360 Degrees (4): able to turn 360 degrees safely in <4 secs (3): able to turn 360 degrees safely on one side only 4 secs or less (2): able to turn 360 degrees safely but slowly (1): needs close supervision or verbal cues (0): needs assistance while turning SCORE: 2  Stair Taps (4): able to stand I and safely and complete 8 steps in 20 secs (3): able to stand I and complete 8 steps in >20 seconds (2): able to complete 4 steps w/o aid w/supervision  (1): able to complete >2 steps needs MinA (0): needs assistance to keep from falling/unable to try SCORE: 2  Standing Unsupported One Foot in Front (tandem) (4): able to place foot tandem I and hold 30 seconds (3): able to place foot ahead I and hold 30 seconds (2): able to take small step I and hold 30 seconds (1): needs help to step but can hold 15 seconds (0): loses balance while stepping or standing SCORE: 2  Standing on One Leg (4): able to lift leg I and hold>10 seconds (3): able to lift leg I and hold 5-10 seconds (2): able to lift leg I and hold >3 seconds (1): tries to left leg unable to hold 3 secs but remains standing I (0):  unable to try/needs assistance to prevent fall SCORE: 1  TODAY'S TREATMENT:  06/21/2022 There Ex:  Sit<>stand x 10 reps, standing heel/toe raises with UE support x 10 reps bilat, single limb stance and marching near support surface. Self:  Educated patient on need for ST and OT services and need to establish care with primary care MD.     PATIENT EDUCATION: Education details: patient Person educated: Patient Education method: Consulting civil engineer, Media planner, and Handouts Education comprehension: verbalized understanding, returned demonstration, and needs further  education   HOME EXERCISE PROGRAM: Access Code: [email or web address] URL: [email or web address] Date: 06/22/2022 Prepared by: Rudell Cobb  Exercises - Heel Toe Raises with Counter Support  - 2 x daily - 7 x weekly - 1 sets - 10 reps - Standing Marching  - 2 x daily - 7 x weekly - 1 sets - 10 reps - Standing Single Leg Stance with Unilateral Counter Support  - 2 x daily - 7 x weekly - 1 sets - 3 reps - 10-15 seconds hold - Corner Balance Feet Together With Eyes Open  - 1 x daily - 7 x weekly - 1-3 sets - 30 sec hold - Corner Balance Feet Together With Eyes Closed  - 1 x daily - 7 x weekly - 1-3 sets - 30 sec hold - Corner Balance Feet Together: Eyes Closed With Head Turns  - 1 x daily - 7 x weekly - 1-3 sets - 30 sec hold - Semi-Tandem Corner Balance With Eyes Closed  - 1 x daily - 7 x weekly - 1-3 sets - 30 sec hold   GOALS: Goals reviewed with patient? Yes  SHORT TERM GOALS: Target date: 07/20/2022  The patient will be indep with initial HEP.  Baseline: no HEP Goal status: INITIAL  2.  The patient will improve Berg Balance Score from 40/56 to 46/56 to demo dec'd risk for falls. Baseline: 40/56 Goal status: INITIAL  3.  The patient will improve 5 time sit to stand from 30 seconds to < or equal to 22 seconds. Baseline: 30 seconds Goal status: INITIAL  4.  The patient will negotiate steps with reciprocal pattern and one handrail mod indep to access basement. Baseline:  step to pattern and not going to basement in home. Goal status: INITIAL  LONG TERM GOALS: Target date: 08/17/2022  The patient will be indep with HEP progression Baseline:  no HEP Goal status: INITIAL  2.  The patient will improve gait speed to > or equal to 3.0 ft/sec Baseline: 2.5 ft/sec Goal status: INITIAL  3.  The patient will improve Berg score to > or equal to 50/56. Baseline: 40/56. Goal status: INITIAL  4.  The patient will get up/down from floor and demo crawling due to  return to work in [place]. Baseline: none. Goal status: INITIAL ASSESSMENT:  CLINICAL IMPRESSION: Tx focus on balance abilities and improving coordination/body awareness facilitation using foam mat and socked feet to perform various balance exercises with emphasis on progressing to eyes closed and performing head movements in order to improve proprioception and kinesthetic awareness and postural stability.  Difficulty with walking on toes and unable to maintain any length of time.  Therapist providing postural correction cues with multiple LOB experienced throughout tx session. Addition of balance activities to perform in corner added for HEP to address balance deficits to improve safety with his work in low light environments and unstable surfaces.     OBJECTIVE IMPAIRMENTS Abnormal gait, decreased activity tolerance, decreased balance, decreased endurance, decreased mobility,  difficulty walking, decreased ROM, decreased strength, and decreased safety awareness.   ACTIVITY LIMITATIONS lifting, bending, standing, squatting, and stairs  PARTICIPATION LIMITATIONS: cleaning, driving, community activity, occupation, and yard work  PERSONAL FACTORS 1-2 comorbidities: HTN, diabetes and chronic health condition mgmt (significant back pain)  are also affecting patient's functional outcome.   REHAB POTENTIAL: Good  CLINICAL DECISION MAKING: Stable/uncomplicated  EVALUATION COMPLEXITY: Low  PLAN: PT FREQUENCY: 1-2x/week  PT DURATION: 8 weeks  PLANNED INTERVENTIONS: Therapeutic exercises, Therapeutic activity, Neuromuscular re-education, Balance training, Gait training, Patient/Family education, Self Care, and Joint mobilization  PLAN FOR NEXT SESSION: Progress HEP, gait training, endurance, high level balance, R LE strengthening.  *Monitor chronic back pain and monitor BP!   Dion Body, PT 07/01/2022, 10:56 AM

## 2022-07-01 NOTE — Therapy (Signed)
OUTPATIENT OCCUPATIONAL THERAPY NEURO EVALUATION  Patient Name: Theodore Hinton MRN: [id number] DOB:1966-08-10, 56 y.o., male Today's Date: 07/01/2022  PCP: [facility] REFERRING PROVIDER: Janey Genta, NP    OT End of Session - 07/01/22 1121     Visit Number 1    Number of Visits 17    Date for OT Re-Evaluation 08/26/22    Authorization Type Cigna    OT Start Time 1026    OT Stop Time 1104    OT Time Calculation (min) 38 min    Activity Tolerance Patient tolerated treatment well    Behavior During Therapy WFL for tasks assessed/performed             Past Medical History:  Diagnosis Date   Back injury    Chronic back pain    Diabetes mellitus without complication ([facility])    Diverticulosis    Hypertension    No past surgical history on file. Patient Active Problem List   Diagnosis Date Noted   Stroke [facility]) 06/18/2022   Stroke (cerebrum) ([facility]) 06/18/2022    ONSET DATE: 06/18/22  REFERRING DIAG: I63.9 (ICD-10-CM) - Cerebrovascular accident (CVA), unspecified mechanism   THERAPY DIAG:  Hemiplegia and hemiparesis following cerebral infarction affecting right non-dominant side ([facility])  Muscle weakness (generalized)  Other lack of coordination  Unsteadiness on feet  Rationale for Evaluation and Treatment Rehabilitation  SUBJECTIVE:   SUBJECTIVE STATEMENT: Pt reports: prior to stroke, he had been at an appointment where they noticed his BP and blood sugar had been high, he went to ED to get checked out.  The next day he was outside to pick tomatoes, and noticed hand and leg tingling, face numb.  He was taken to the hospital.  He reports that he is still having difficulty with feeling on the R side of his mouth and is drooling now.  H reports  that his speech is worse in the morning and notices that sometimes he drops things at work.  Pt accompanied by: self and friend/employee  PERTINENT HISTORY: DM, stroke with residual right sided deficit,  and HTN.   PRECAUTIONS: Other: HTN  WEIGHT BEARING RESTRICTIONS No  PAIN:  Are you having pain? No  FALLS: Has patient fallen in last 6 months? 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  PATIENT GOALS to get rid of the stinging and numbness  OBJECTIVE:   HAND DOMINANCE: Left  ADLs: Overall ADLs: Reports overall Independent with self-care tasks LB Dressing: increased time to don and tie shoes Tub Shower transfers: tub/shower Mod I Equipment: Grab bars   IADLs: Light housekeeping: somewhat Meal Prep: "I used to" but having trouble keeping things in hand Community mobility: still driving Medication management: occasional dropping of pills, wife assists with filling up pill box  MOBILITY STATUS: Independent  POSTURE COMMENTS:  No Significant postural limitations  ACTIVITY TOLERANCE: Activity tolerance: WFL for tasks assessed  FUNCTIONAL OUTCOME MEASURES: FOTO: 61  UPPER EXTREMITY ROM     Active ROM Right eval Left eval  Shoulder flexion Able to achieve full shoulder flexion, however requires use of abduction to complete full ROM [facility]  Shoulder abduction    Shoulder adduction    Shoulder  extension    Shoulder internal rotation    Shoulder external rotation    Elbow flexion    Elbow extension    Wrist flexion    Wrist extension    Wrist ulnar deviation    Wrist radial deviation    Wrist pronation    Wrist supination    (Blank rows = not tested)   UPPER EXTREMITY MMT:     MMT Right eval Left eval  Shoulder flexion 4/5 WFL   Shoulder abduction    Shoulder adduction    Shoulder extension    Shoulder internal  rotation    Shoulder external rotation    Middle trapezius    Lower trapezius    Elbow flexion 4-/5   Elbow extension 4-/5   Wrist flexion 4-/5   Wrist extension 4-/5   Wrist ulnar deviation    Wrist radial deviation    Wrist pronation    Wrist supination    (Blank rows = not tested)  HAND FUNCTION: Grip strength: Right: 59 lbs; Left: 85 lbs, Lateral pinch: Right: 11 lbs, Left: 22 lbs, and 3 point pinch: Right: 11 lbs, Left: 16 lbs  COORDINATION: Finger Nose Finger test: Mild decrease in speed on R compared to L 9 Hole Peg test: Right: 37.34 sec; Left: 25.53 sec Box and Blocks:  Right 32 blocks, Left 49 blocks  SENSATION: Light touch: Impaired  and reports numbness and tingling in thumb, index, and long finger into palm of hand.  Reports generalized "decreased sensation in hand and forearm of R > L"  COGNITION: Overall cognitive status: Impaired and reports mild increased time to process, focus, and difficulty with memory.  Utilizing writing down of information to retain information  VISION: Subjective report: R eye feels a little droopy with intermittent fuzziness Baseline vision: No visual deficits   VISION ASSESSMENT: Tracking/Visual pursuits: Requires cues, head turns, or add eye shifts to track Saccades: decreased speed of saccadic movements     TODAY'S TREATMENT:  Educated on BE FAST acronym of strokes signs and symptoms.  Provided with handout   PATIENT EDUCATION: Education details: Educated on role and purpose of OT as well as potential interventions and goals for therapy based on initial evaluation findings. Person educated: Patient Education method: Explanation and Handouts Education comprehension: verbalized understanding and needs further education   HOME EXERCISE PROGRAM: TBD    GOALS: Goals reviewed with patient? No  SHORT TERM GOALS: Target date: 07/29/22  Pt will be independent in HEP to increase coordination and strengthening as needed for  ADLs, IADLs, and work tasks. Baseline: Goal status: INITIAL  2.  Pt will demonstrate increased grip strength to sustain grasp on variety of sized items, reporting decreased dropping of items during work tasks. Baseline: Right: 59 lbs; Left: 85 lbs Goal status: INITIAL  3.  Pt will demonstrate improved UE functional use for ADLs as evidenced by increasing box/ blocks score by 4 blocks with RUE Baseline: Right 32 blocks, Left 49 blocks Goal status: INITIAL  4.  Pt will report understanding of modifications and increased safety awareness for ADLs, IADLs, and work tasks to compensate for impaired sensation. Baseline:  Goal status: INITIAL   LONG TERM GOALS: Target date: 08/26/22  Pt will demonstrate improved shoulder ROM and strength to place and retreive moderately heavy items in overhead cabinet (as pt works heating/air and frequently does tasks overhead) Baseline:  Goal status: INITIAL  2.  Pt will demonstrate and/or report improved strength and coordination as needed to increase  independence in IADL tasks (housekeeping and meal prep). Baseline:  Goal status: INITIAL  3.  Pt will demonstrate improved fine motor coordination for ADLs and work tasks as evidenced by decreasing 9 hole peg test score for RUE by 6 secs Baseline: Right: 37.34 sec; Left: 25.53 sec Goal status: INITIAL  4.  Pt will demonstrate and report overall improvements in ADLs and IADLs as demonstrated by increased score on FOTO >/= to 75 Baseline: 61 Goal status: INITIAL   ASSESSMENT:  CLINICAL IMPRESSION: Patient is a 56 y.o. male who was seen today for occupational therapy evaluation for impaired coordination, strength, and sensation in non-dominant RUE s/p acute stroke. Pt currently lives with spouse and 84 yo grandson in a single level home with basement and is currently works working in [employer]. Pt will benefit from skilled occupational therapy services to address strength and coordination, ROM,  altered sensation, balance, GM/FM control, cognition, safety awareness, introduction of compensatory strategies/AE prn, visual-perception, and implementation of an HEP to improve participation and safety during ADLs, IADLs, and work tasks.    PERFORMANCE DEFICITS in functional skills including ADLs, IADLs, coordination, dexterity, sensation, ROM, strength, pain, flexibility, FMC, GMC, mobility, balance, body mechanics, decreased knowledge of precautions, decreased knowledge of use of DME, vision, and UE functional use, cognitive skills including attention, memory, and safety awareness, and psychosocial skills including environmental adaptation.   IMPAIRMENTS are limiting patient from ADLs, IADLs, and work.   COMORBIDITIES may have co-morbidities  that affects occupational performance. Patient will benefit from skilled OT to address above impairments and improve overall function.  MODIFICATION OR ASSISTANCE TO COMPLETE EVALUATION: Min-Moderate modification of tasks or assist with assess necessary to complete an evaluation.  OT OCCUPATIONAL PROFILE AND HISTORY: Detailed assessment: Review of records and additional review of physical, cognitive, psychosocial history related to current functional performance.  CLINICAL DECISION MAKING: Moderate - several treatment options, min-mod task modification necessary  REHAB POTENTIAL: Good  EVALUATION COMPLEXITY: Moderate    PLAN: OT FREQUENCY: 1-2x/week  OT DURATION: 8 weeks  PLANNED INTERVENTIONS: self care/ADL training, therapeutic exercise, therapeutic activity, neuromuscular re-education, manual therapy, passive range of motion, balance training, functional mobility training, electrical stimulation, ultrasound, compression bandaging, moist heat, cryotherapy, patient/family education, cognitive remediation/compensation, visual/perceptual remediation/compensation, psychosocial skills training, energy conservation, coping strategies training, and DME  and/or AE instructions  RECOMMENDED OTHER SERVICES: SLP  CONSULTED AND AGREED WITH PLAN OF CARE: Patient  PLAN FOR NEXT SESSION: Initiate strength and coordination HEP   Maylon Sailors, OTR/L 07/01/2022, 11:22 AM

## 2022-07-05 ENCOUNTER — Ambulatory Visit: Payer: Commercial Managed Care - HMO | Attending: Adult Health

## 2022-07-05 ENCOUNTER — Ambulatory Visit: Payer: Commercial Managed Care - HMO | Admitting: Occupational Therapy

## 2022-07-12 ENCOUNTER — Ambulatory Visit: Payer: Commercial Managed Care - HMO | Admitting: Occupational Therapy

## 2022-07-12 ENCOUNTER — Ambulatory Visit: Payer: Commercial Managed Care - HMO | Admitting: Physical Therapy

## 2022-07-18 ENCOUNTER — Ambulatory Visit: Payer: Commercial Managed Care - HMO

## 2022-07-18 ENCOUNTER — Ambulatory Visit: Payer: Commercial Managed Care - HMO | Admitting: Occupational Therapy

## 2022-07-20 ENCOUNTER — Encounter: Payer: Managed Care, Other (non HMO) | Admitting: Occupational Therapy

## 2022-07-20 ENCOUNTER — Ambulatory Visit: Payer: Managed Care, Other (non HMO) | Admitting: Physical Therapy

## 2022-07-25 ENCOUNTER — Ambulatory Visit: Payer: Managed Care, Other (non HMO)

## 2022-07-25 ENCOUNTER — Encounter: Payer: Managed Care, Other (non HMO) | Admitting: Occupational Therapy

## 2022-07-25 NOTE — Therapy (Signed)
[facility] [address] [place], [place], [address] Phone: [phone]   Fax:  [phone]  Patient Details  Name: Theodore Hinton MRN: [id number] Date of Birth: 10/23/1966 Referring Provider:  No ref. provider found  Encounter Date: 07/25/2022  PHYSICAL THERAPY DISCHARGE SUMMARY  Visits from Start of Care: 2  Current functional level related to goals / functional outcomes: Balance and mobility deficits. Unable to determine outcomes   Remaining deficits: Balance/mobility   Education / Equipment: HEP initiated   Patient agrees to discharge. Patient goals were not met. Patient is being discharged due to not returning since the last visit.  Toniann Fail, PT 07/25/2022, 9:42 AM  [facility] [address] [place], [place], [address] Phone: [phone]   Fax:  [phone]

## 2022-07-27 ENCOUNTER — Ambulatory Visit: Payer: Managed Care, Other (non HMO)

## 2022-07-27 ENCOUNTER — Encounter: Payer: Managed Care, Other (non HMO) | Admitting: Occupational Therapy

## 2022-08-01 ENCOUNTER — Ambulatory Visit: Payer: Managed Care, Other (non HMO)

## 2022-08-01 ENCOUNTER — Encounter: Payer: Managed Care, Other (non HMO) | Admitting: Occupational Therapy

## 2022-08-03 ENCOUNTER — Encounter: Payer: Managed Care, Other (non HMO) | Admitting: Occupational Therapy

## 2022-08-03 ENCOUNTER — Ambulatory Visit: Payer: Managed Care, Other (non HMO)

## 2022-08-09 ENCOUNTER — Ambulatory Visit: Payer: Managed Care, Other (non HMO)

## 2022-08-09 ENCOUNTER — Encounter: Payer: Managed Care, Other (non HMO) | Admitting: Occupational Therapy

## 2022-08-11 ENCOUNTER — Encounter: Payer: Managed Care, Other (non HMO) | Admitting: Occupational Therapy

## 2022-08-11 ENCOUNTER — Ambulatory Visit: Payer: Managed Care, Other (non HMO)

## 2022-08-16 ENCOUNTER — Ambulatory Visit: Payer: Managed Care, Other (non HMO)

## 2022-08-16 ENCOUNTER — Encounter: Payer: Managed Care, Other (non HMO) | Admitting: Occupational Therapy

## 2022-08-18 ENCOUNTER — Encounter: Payer: Managed Care, Other (non HMO) | Admitting: Occupational Therapy

## 2022-08-18 ENCOUNTER — Ambulatory Visit: Payer: Managed Care, Other (non HMO)

## 2022-09-02 ENCOUNTER — Encounter: Payer: Self-pay | Admitting: Occupational Therapy

## 2022-09-02 NOTE — Therapy (Signed)
[facility] [address], [place], [place], [address] Phone: [phone]   Fax:  [phone]  Patient Details  Name: Theodore Hinton MRN: [id number] Date of Birth: July 08, 1966 Referring Provider:  No ref. provider found  Encounter Date: 09/02/2022  OCCUPATIONAL THERAPY DISCHARGE SUMMARY  Visits from Start of Care: 1  Current functional level related to goals / functional outcomes: See initial evaluation as pt did not return for further therapy sessions.  Pt with impaired strength, coordination, and sensation impacting ADLs and work tasks.   Remaining deficits: Strength, coordination, sensation   Education / Equipment: Educated on stroke signs/symptoms.  No further education due pt not returning after initial eval.  Patient agrees to discharge. Patient goals were not met. Patient is being discharged due to not returning since the last visit.Marland Kitchen     [name], OT 09/02/2022, 9:36 AM  [facility] [address] [place], [place], [address] Phone: [phone]   Fax:  [phone]

## 2022-09-08 ENCOUNTER — Encounter: Payer: Self-pay | Admitting: Neurology

## 2022-09-08 ENCOUNTER — Ambulatory Visit: Payer: Commercial Managed Care - HMO | Admitting: Neurology

## 2022-09-08 VITALS — BP 156/97 | HR 87 | Ht 68.0 in | Wt 186.2 lb

## 2022-09-08 DIAGNOSIS — R202 Paresthesia of skin: Secondary | ICD-10-CM

## 2022-09-08 DIAGNOSIS — I6381 Other cerebral infarction due to occlusion or stenosis of small artery: Secondary | ICD-10-CM

## 2022-09-08 MED ORDER — ASPIRIN 81 MG PO TBEC: 81.0000 mg | DELAYED_RELEASE_TABLET | Freq: Every day | ORAL | 12 refills | Status: DC

## 2022-09-08 NOTE — Patient Instructions (Signed)
I had a long d/w patient about his recent lacunar stroke, risk for recurrent stroke/TIAs, personally independently reviewed imaging studies and stroke evaluation results and answered questions.Continue aspirin 81 mg daily  for secondary stroke prevention and maintain strict control of hypertension with blood pressure goal below 130/90, diabetes with hemoglobin A1c goal below 6.5% and lipids with LDL cholesterol goal below 70 mg/dL. I also advised the patient to eat a healthy diet with plenty of whole grains, cereals, fruits and vegetables, exercise regularly and maintain ideal body weight . Check f/u lipid panel today.Followup in the future with my nurse practitioner in 6 months or call earlier if needed.  Stroke Prevention Some medical conditions and behaviors can lead to a higher chance of having a stroke. You can help prevent a stroke by eating healthy, exercising, not smoking, and managing any medical conditions you have. Stroke is a leading cause of functional impairment. Primary prevention is particularly important because a majority of strokes are first-time events. Stroke changes the lives of not only those who experience a stroke but also their family and other caregivers. How can this condition affect me? A stroke is a medical emergency and should be treated right away. A stroke can lead to brain damage and can sometimes be life-threatening. If a person gets medical treatment right away, there is a better chance of surviving and recovering from a stroke. What can increase my risk? The following medical conditions may increase your risk of a stroke: Cardiovascular disease. High blood pressure (hypertension). Diabetes. High cholesterol. Sickle cell disease. Blood clotting disorders (hypercoagulable state). Obesity. Sleep disorders (obstructive sleep apnea). Other risk factors include: Being older than age 100. Having a history of blood clots, stroke, or mini-stroke (transient ischemic  attack, TIA). Genetic factors, such as race, ethnicity, or a family history of stroke. Smoking cigarettes or using other tobacco products. Taking birth control pills, especially if you also use tobacco. Heavy use of alcohol or drugs, especially cocaine and methamphetamine. Physical inactivity. What actions can I take to prevent this? Manage your health conditions High cholesterol levels. Eating a healthy diet is important for preventing high cholesterol. If cholesterol cannot be managed through diet alone, you may need to take medicines. Take any prescribed medicines to control your cholesterol as told by your health care provider. Hypertension. To reduce your risk of stroke, try to keep your blood pressure below 130/80. Eating a healthy diet and exercising regularly are important for controlling blood pressure. If these steps are not enough to manage your blood pressure, you may need to take medicines. Take any prescribed medicines to control hypertension as told by your health care provider. Ask your health care provider if you should monitor your blood pressure at home. Have your blood pressure checked every year, even if your blood pressure is normal. Blood pressure increases with age and some medical conditions. Diabetes. Eating a healthy diet and exercising regularly are important parts of managing your blood sugar (glucose). If your blood sugar cannot be managed through diet and exercise, you may need to take medicines. Take any prescribed medicines to control your diabetes as told by your health care provider. Get evaluated for obstructive sleep apnea. Talk to your health care provider about getting a sleep evaluation if you snore a lot or have excessive sleepiness. Make sure that any other medical conditions you have, such as atrial fibrillation or atherosclerosis, are managed. Nutrition Follow instructions from your health care provider about what to eat or drink to help manage  your  health condition. These instructions may include: Reducing your daily calorie intake. Limiting how much salt (sodium) you use to 1,500 milligrams (mg) each day. Using only healthy fats for cooking, such as olive oil, canola oil, or sunflower oil. Eating healthy foods. You can do this by: Choosing foods that are high in fiber, such as whole grains, and fresh fruits and vegetables. Eating at least 5 servings of fruits and vegetables a day. Try to fill one-half of your plate with fruits and vegetables at each meal. Choosing lean protein foods, such as lean cuts of meat, poultry without skin, fish, tofu, beans, and nuts. Eating low-fat dairy products. Avoiding foods that are high in sodium. This can help lower blood pressure. Avoiding foods that have saturated fat, trans fat, and cholesterol. This can help prevent high cholesterol. Avoiding processed and prepared foods. Counting your daily carbohydrate intake.  Lifestyle If you drink alcohol: Limit how much you have to: 0-1 drink a day for women who are not pregnant. 0-2 drinks a day for men. Know how much alcohol is in your drink. In the [place] one drink equals one 12 oz bottle of beer (384m), one 5 oz glass of wine (1454m, or one 1 oz glass of hard liquor (4443m Do not use any products that contain nicotine or tobacco. These products include cigarettes, chewing tobacco, and vaping devices, such as e-cigarettes. If you need help quitting, ask your health care provider. Avoid secondhand smoke. Do not use drugs. Activity  Try to stay at a healthy weight. Get at least 30 minutes of exercise on most days, such as: Fast walking. Biking. Swimming. Medicines Take over-the-counter and prescription medicines only as told by your health care provider. Aspirin or blood thinners (antiplatelets or anticoagulants) may be recommended to reduce your risk of forming blood clots that can lead to stroke. Avoid taking birth control pills. Talk to your  health care provider about the risks of taking birth control pills if: You are over 35 44ars old. You smoke. You get very bad headaches. You have had a blood clot. Where to find more information American Stroke Association: www.strokeassociation.org Get help right away if: You or a loved one has any symptoms of a stroke. "BE FAST" is an easy way to remember the main warning signs of a stroke: B - Balance. Signs are dizziness, sudden trouble walking, or loss of balance. E - Eyes. Signs are trouble seeing or a sudden change in vision. F - Face. Signs are sudden weakness or numbness of the face, or the face or eyelid drooping on one side. A - Arms. Signs are weakness or numbness in an arm. This happens suddenly and usually on one side of the body. S - Speech. Signs are sudden trouble speaking, slurred speech, or trouble understanding what people say. T - Time. Time to call emergency services. Write down what time symptoms started. You or a loved one has other signs of a stroke, such as: A sudden, severe headache with no known cause. Nausea or vomiting. Seizure. These symptoms may represent a serious problem that is an emergency. Do not wait to see if the symptoms will go away. Get medical help right away. Call your local emergency services (911 in the [place] Do not drive yourself to the hospital. Summary You can help to prevent a stroke by eating healthy, exercising, not smoking, limiting alcohol intake, and managing any medical conditions you may have. Do not use any products that contain nicotine or tobacco. These  include cigarettes, chewing tobacco, and vaping devices, such as e-cigarettes. If you need help quitting, ask your health care provider. Remember "BE FAST" for warning signs of a stroke. Get help right away if you or a loved one has any of these signs. This information is not intended to replace advice given to you by your health care provider. Make sure you discuss any questions you  have with your health care provider. Document Revised: 06/22/2020 Document Reviewed: 06/22/2020 Elsevier Patient Education  [employer].

## 2022-09-08 NOTE — Progress Notes (Signed)
[facility] [address] [place]. [place] [address] [phone]       OFFICE FOLLOW-UP NOTE  [name] Date of Birth:  09/01/1966 Medical Record Number:  [id number]   HPI: [name] is a 56 year old Caucasian male seen today for initial office follow-up visit for stroke.  History is obtained from the patient and review of electronic medical records and I have personally reviewed pertinent available imaging films in PACS.Marland Kitchen  He has past medical history of diabetes, hypertension diverticulosis.  He presented on 06/18/2022 with sudden onset of right-sided facial droop, facial numbness, slurred speech and right arm weakness.  He also felt lightheaded and saw spots in front of his eyes.  He was seen by [facility] given IV TNK question of risk benefits and and after patient consent.  He was transferred to [facility] had careful neurological follow-up and strict blood pressure control.  He did well and right-sided weakness improved but he had residual numbness in the right face and minor extent the right hand.  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  Echocardiogram showed ejection fraction 65 to 70%.  LDL cholesterol 121 mg percent and hemoglobin A1c was 10.3.  Patient was started on aspirin Plavix as well as Lipitor.  He states he has done well since discharge.  He does need physical occupational therapy.  His right-sided strength has improved.  He still has some numbness in the right cheek and left and his taste is altered.  He has very occasional tingling in the right hand.  He has not had any recurrent stroke or TIA symptoms.  He is Recommending alcohol smoke cigarettes.  Patient was advised to stop Plavix after 3 weeks and stay on aspirin but he seems to have stopped both of the medications.  ROS:   14 system review of systems is positive for numbness, tingling, altered taste all other systems  negative  PMH:  Past Medical History:  Diagnosis Date   Back injury    Chronic back pain    Diabetes mellitus without complication (HCC)    Diverticulosis    Hypertension     Social History:  Social History   Socioeconomic History   Marital status: Single    Spouse name: Not on file   Number of children: Not on file   Years of education: Not on file   Highest education level: Not on file  Occupational History   Not on file  Tobacco Use   Smoking status: Never   Smokeless tobacco: Current    Types: Chew  Vaping Use   Vaping Use: Never used  Substance and Sexual Activity   Alcohol use: Not Currently    Comment: occasionally   Drug use: No   Sexual activity: Not on file  Other Topics Concern   Not on file  Social History Narrative   Not on file   Social Determinants of Health   Financial Resource Strain: Not on file  Food Insecurity: Not on file  Transportation Needs: Not on file  Physical Activity: Not on file  Stress: Not on file  Social Connections: Not on file  Intimate Partner Violence: Not on file    Medications:   Current Outpatient Medications on File Prior to Visit  Medication Sig Dispense Refill   amLODipine (NORVASC) 5 MG tablet Take 1 tablet (5 mg total) by mouth daily. 30 tablet 1   atorvastatin (LIPITOR) 80 MG tablet Take 1 tablet (  80 mg total) by mouth daily. 30 tablet 2   glipiZIDE (GLUCOTROL) 5 MG tablet Take 1 tablet (5 mg total) by mouth daily before breakfast. 30 tablet 2   No current facility-administered medications on file prior to visit.    Allergies:  No Known Allergies  Physical Exam General: well developed, well nourished middle-aged Caucasian male, seated, in no evident distress Head: head normocephalic and atraumatic.  Neck: supple with no carotid or supraclavicular bruits Cardiovascular: regular rate and rhythm, no murmurs Musculoskeletal: no deformity Skin:  no rash/petichiae Vascular:  Normal pulses all  extremities Vitals:   09/08/22 1002  BP: (!) 156/97  Pulse: 87   Neurologic Exam Mental Status: Awake and fully alert. Oriented to place and time. Recent and remote memory intact. Attention span, concentration and fund of knowledge appropriate. Mood and affect appropriate.  Cranial Nerves: Fundoscopic exam reveals sharp disc margins. Pupils equal, briskly reactive to light. Extraocular movements full without nystagmus. Visual fields full to confrontation. Hearing intact. Facial sensation intact. Face, tongue, palate moves normally and symmetrically.  Motor: Normal bulk and tone. Normal strength in all tested extremity muscles.  Diminished fine finger movements on the right.  Orbits left or right upper extremity. Sensory.: intact to touch ,pinprick .position and vibratory sensation.  Except some decreased sensation in the right lower face. Coordination: Rapid alternating movements normal in all extremities. Finger-to-nose and heel-to-shin performed accurately bilaterally. Gait and Station: Arises from chair without difficulty. Stance is normal. Gait demonstrates normal stride length and balance . Able to heel, toe and tandem walk with slight difficulty.  Reflexes: 1+ and symmetric. Toes downgoing.   NIHSS  1 Modified Rankin  2   ASSESSMENT: 56 year old Caucasian male with left thalamic lacunar infarct in July 2023 treated with IV TNK with modest recovery.  He still has persistent right-sided paresthesias.  Vascular risk factors of diabetes, hyperlipidemia hypertension and intracranial atherosclerosis     PLAN:I had a long d/w patient about his recent lacunar stroke, risk for recurrent stroke/TIAs, personally independently reviewed imaging studies and stroke evaluation results and answered questions.Continue aspirin 81 mg daily  for secondary stroke prevention and maintain strict control of hypertension with blood pressure goal below 130/90, diabetes with hemoglobin A1c goal below 6.5% and  lipids with LDL cholesterol goal below 70 mg/dL. I also advised the patient to eat a healthy diet with plenty of whole grains, cereals, fruits and vegetables, exercise regularly and maintain ideal body weight . Check f/u lipid panel today.Followup in the future with my nurse practitioner in 6 months or call earlier if needed..Greater than 50% of time during this 35 minute visit was spent on counseling,explanation of diagnosis, planning of further management, discussion with patient and family and coordination of care Delia Heady, MD Note: This document was prepared with digital dictation and possible smart phrase technology. Any transcriptional errors that result from this process are unintentional

## 2022-09-24 ENCOUNTER — Other Ambulatory Visit: Payer: Self-pay

## 2022-09-24 ENCOUNTER — Emergency Department (HOSPITAL_COMMUNITY): Payer: Commercial Managed Care - HMO

## 2022-09-24 ENCOUNTER — Encounter (HOSPITAL_COMMUNITY): Payer: Self-pay | Admitting: Emergency Medicine

## 2022-09-24 ENCOUNTER — Emergency Department (HOSPITAL_COMMUNITY)
Admission: EM | Admit: 2022-09-24 | Discharge: 2022-09-24 | Disposition: A | Discharge status: Home / Self Care | Payer: Commercial Managed Care - HMO | Attending: Emergency Medicine | Admitting: Emergency Medicine

## 2022-09-24 DIAGNOSIS — R200 Anesthesia of skin: Secondary | ICD-10-CM | POA: Diagnosis present

## 2022-09-24 DIAGNOSIS — Z7982 Long term (current) use of aspirin: Secondary | ICD-10-CM | POA: Diagnosis not present

## 2022-09-24 DIAGNOSIS — Z7984 Long term (current) use of oral hypoglycemic drugs: Secondary | ICD-10-CM | POA: Insufficient documentation

## 2022-09-24 DIAGNOSIS — R531 Weakness: Secondary | ICD-10-CM | POA: Diagnosis not present

## 2022-09-24 DIAGNOSIS — Z79899 Other long term (current) drug therapy: Secondary | ICD-10-CM | POA: Insufficient documentation

## 2022-09-24 DIAGNOSIS — I10 Essential (primary) hypertension: Secondary | ICD-10-CM | POA: Diagnosis not present

## 2022-09-24 DIAGNOSIS — E119 Type 2 diabetes mellitus without complications: Secondary | ICD-10-CM | POA: Insufficient documentation

## 2022-09-24 DIAGNOSIS — R2990 Unspecified symptoms and signs involving the nervous system: Secondary | ICD-10-CM

## 2022-09-24 LAB — PROTIME-INR
INR: 1 (ref 0.8–1.2)
Prothrombin Time: 13.4 seconds (ref 11.4–15.2)

## 2022-09-24 LAB — DIFFERENTIAL
Abs Immature Granulocytes: 0.02 10*3/uL (ref 0.00–0.07)
Basophils Absolute: 0.1 10*3/uL (ref 0.0–0.1)
Basophils Relative: 1 %
Eosinophils Absolute: 0.5 10*3/uL (ref 0.0–0.5)
Eosinophils Relative: 5 %
Immature Granulocytes: 0 %
Lymphocytes Relative: 25 %
Lymphs Abs: 2.5 10*3/uL (ref 0.7–4.0)
Monocytes Absolute: 0.6 10*3/uL (ref 0.1–1.0)
Monocytes Relative: 6 %
Neutro Abs: 6.4 10*3/uL (ref 1.7–7.7)
Neutrophils Relative %: 63 %

## 2022-09-24 LAB — COMPREHENSIVE METABOLIC PANEL
ALT: 29 U/L (ref 0–44)
AST: 29 U/L (ref 15–41)
Albumin: 3.8 g/dL (ref 3.5–5.0)
Alkaline Phosphatase: 61 U/L (ref 38–126)
Anion gap: 11 (ref 5–15)
BUN: 6 mg/dL (ref 6–20)
CO2: 25 mmol/L (ref 22–32)
Calcium: 8.9 mg/dL (ref 8.9–10.3)
Chloride: 103 mmol/L (ref 98–111)
Creatinine, Ser: 0.84 mg/dL (ref 0.61–1.24)
GFR, Estimated: >60 mL/min (ref >60)
Glucose, Bld: 159 mg/dL — ABNORMAL HIGH (ref 70–99)
Potassium: 3.5 mmol/L (ref 3.5–5.1)
Sodium: 139 mmol/L (ref 135–145)
Total Bilirubin: 1.6 mg/dL — ABNORMAL HIGH (ref 0.3–1.2)
Total Protein: 6.9 g/dL (ref 6.5–8.1)

## 2022-09-24 LAB — I-STAT CHEM 8, ED
BUN: 7 mg/dL (ref 6–20)
Calcium, Ion: 1.06 mmol/L — ABNORMAL LOW (ref 1.15–1.40)
Chloride: 101 mmol/L (ref 98–111)
Creatinine, Ser: 0.7 mg/dL (ref 0.61–1.24)
Glucose, Bld: 151 mg/dL — ABNORMAL HIGH (ref 70–99)
HCT: 55 % — ABNORMAL HIGH (ref 39.0–52.0)
Hemoglobin: 18.7 g/dL — ABNORMAL HIGH (ref 13.0–17.0)
Potassium: 3.5 mmol/L (ref 3.5–5.1)
Sodium: 140 mmol/L (ref 135–145)
TCO2: 27 mmol/L (ref 22–32)

## 2022-09-24 LAB — CBC
HCT: 53.7 % — ABNORMAL HIGH (ref 39.0–52.0)
Hemoglobin: 19.4 g/dL — ABNORMAL HIGH (ref 13.0–17.0)
MCH: 31.9 pg (ref 26.0–34.0)
MCHC: 36.1 g/dL — ABNORMAL HIGH (ref 30.0–36.0)
MCV: 88.3 fL (ref 80.0–100.0)
Platelets: 260 10*3/uL (ref 150–400)
RBC: 6.08 MIL/uL — ABNORMAL HIGH (ref 4.22–5.81)
RDW: 13.7 % (ref 11.5–15.5)
WBC: 10.1 10*3/uL (ref 4.0–10.5)
nRBC: 0 % (ref 0.0–0.2)

## 2022-09-24 LAB — ETHANOL: Alcohol, Ethyl (B): <10 mg/dL (ref <10)

## 2022-09-24 LAB — APTT: aPTT: 26 seconds (ref 24–36)

## 2022-09-24 MED ORDER — HYDROMORPHONE HCL 1 MG/ML IJ SOLN
1.0000 mg | Freq: Once | INTRAMUSCULAR | Status: AC
  Administered 2022-09-24: 1 mg via INTRAMUSCULAR
  Filled 2022-09-24: qty 1

## 2022-09-24 MED ORDER — LORAZEPAM 2 MG/ML IJ SOLN: 1.0000 mg | Freq: Once | INTRAMUSCULAR | Status: DC | PRN

## 2022-09-24 NOTE — Discharge Instructions (Signed)
You are seen in the ER for symptoms that are concerning for stroke. However, MRI of your brain has ruled out a stroke.  Please follow-up with your neurologist in 1 to 2 weeks.  Return to the ER if your symptoms get worse.

## 2022-09-24 NOTE — ED Triage Notes (Signed)
Patient arrives POV w/ a last known normal of 530 pm yesterday. Patient c/o of R sided facial, hand, and foot numbness. Pt w/ hx of stroke.

## 2022-09-24 NOTE — ED Provider Triage Note (Signed)
Emergency Medicine Provider Triage Evaluation Note  Theodore Hinton , a 56 y.o. male  was evaluated in triage.  Pt complains of strokelike symptoms.  Patient states that symptoms began around 530 yesterday morning.  Patient has a history of stroke with right-sided deficits including grip strength on right side, imbalance, and dysarthria involving the right side.  Symptoms began with increasingly slurred speech, right upper extremity numbness and right lower extremity numbness and right facial numbness.  Patient notes increasing severity of symptoms since onset.    Review of Systems  Positive: See above Negative:   Physical Exam  BP (!) 158/107   Pulse 91   Temp 98.2 F (36.8 C) (Oral)   Resp 18   SpO2 97%  Gen:   Awake, no distress   Resp:  Normal effort  MSK:   Moves extremities without difficulty  Other:  Patient has obvious dysarthria.  Right-sided facial numbness, decreased grip strength on right side.  Tongue deviation to the left.  Sensory deficits in right lower extremity.  Medical Decision Making  Medically screening exam initiated at 1:01 PM.  Appropriate orders placed.  Theodore Hinton was informed that the remainder of the evaluation will be completed by another provider, this initial triage assessment does not replace that evaluation, and the importance of remaining in the ED until their evaluation is complete.     Theodore Hinton, [place] 09/24/22 1303

## 2022-09-24 NOTE — ED Provider Notes (Signed)
[facility] EMERGENCY DEPARTMENT Provider Note   CSN: [id number] Arrival date & time: 09/24/22  1249     History  Chief Complaint  Patient presents with   Stroke Symptoms    Theodore Hinton is a 56 y.o. male.  HPI    56 year old male comes in with chief complaint of strokelike symptoms. Patient had stroke in July with right-sided symptoms.  He comes in today complaining of facial numbness to the right side of the face, right upper extremity and right lower extremity numbness that is more pronounced and he feels that the right upper extremity is weak.  Patient has history of hypertension, hyperlipidemia, diabetes.   Home Medications Prior to Admission medications   Medication Sig Start Date End Date Taking? Authorizing Provider  amLODipine (NORVASC) 5 MG tablet Take 1 tablet (5 mg total) by mouth daily. 01/06/22   Varney Biles, MD  aspirin EC 81 MG tablet Take 1 tablet (81 mg total) by mouth daily. Swallow whole. 09/08/22   Garvin Fila, MD  atorvastatin (LIPITOR) 80 MG tablet Take 1 tablet (80 mg total) by mouth daily. 06/21/22   Bailey-Modzik, Delila A, NP  glipiZIDE (GLUCOTROL) 5 MG tablet Take 1 tablet (5 mg total) by mouth daily before breakfast. 06/20/22 09/18/22  Bailey-Modzik, Mayo Ao A, NP      Allergies    Patient has no known allergies.    Review of Systems   Review of Systems  All other systems reviewed and are negative.   Physical Exam Updated Vital Signs BP (!) 169/97   Pulse 67   Temp 98.1 F (36.7 C)   Resp 18   SpO2 96%  Physical Exam Vitals and nursing note reviewed.  Constitutional:      Appearance: He is well-developed.  HENT:     Head: Atraumatic.  Eyes:     Extraocular Movements: Extraocular movements intact.     Pupils: Pupils are equal, round, and reactive to light.  Cardiovascular:     Rate and Rhythm: Normal rate.  Pulmonary:     Effort: Pulmonary effort is normal.  Musculoskeletal:     Cervical back: Neck  supple.  Skin:    General: Skin is warm.  Neurological:     Mental Status: He is alert and oriented to person, place, and time.     Cranial Nerves: No cranial nerve deficit.     Sensory: Sensory deficit present.     Motor: Weakness present.     Coordination: Coordination normal.     Comments: Patient is noted to have right-sided upper extremity drift.  He also has subjective numbness to the right side of the face, right upper and lower extremity     ED Results / Procedures / Treatments   Labs (all labs ordered are listed, but only abnormal results are displayed) Labs Reviewed  CBC - Abnormal; Notable for the following components:      Result Value   RBC 6.08 (*)    Hemoglobin 19.4 (*)    HCT 53.7 (*)    MCHC 36.1 (*)    All other components within normal limits  COMPREHENSIVE METABOLIC PANEL - Abnormal; Notable for the following components:   Glucose, Bld 159 (*)    Total Bilirubin 1.6 (*)    All other components within normal limits  I-STAT CHEM 8, ED - Abnormal; Notable for the following components:   Glucose, Bld 151 (*)    Calcium, Ion 1.06 (*)    Hemoglobin 18.7 (*)  HCT 55.0 (*)    All other components within normal limits  ETHANOL  PROTIME-INR  APTT  DIFFERENTIAL  RAPID URINE DRUG SCREEN, HOSP PERFORMED  URINALYSIS, ROUTINE W REFLEX MICROSCOPIC    EKG EKG Interpretation  Date/Time:  Saturday September 24 2022 12:48:45 EDT Ventricular Rate:  93 PR Interval:  182 QRS Duration: 72 QT Interval:  362 QTC Calculation: 450 R Axis:   -9 Text Interpretation: Normal sinus rhythm Inferior infarct , age undetermined Possible Anterior infarct , age undetermined Abnormal ECG When compared with ECG of 18-Jun-2022 16:57, PREVIOUS ECG IS PRESENT No acute changes No significant change since last tracing Confirmed by Varney Biles ([id number]) on 09/24/2022 7:16:13 PM  Radiology MR BRAIN WO CONTRAST  Result Date: 09/24/2022 CLINICAL DATA:  Neuro deficit. Stroke suspected.  Right-sided weakness. EXAM: MRI HEAD WITHOUT CONTRAST TECHNIQUE: Multiplanar, multiecho pulse sequences of the brain and surrounding structures were obtained without intravenous contrast. COMPARISON:  MRI Brain 06/19/2022 FINDINGS: Brain: No acute infarction, hemorrhage, hydrocephalus, extra-axial collection or mass lesion. Redemonstrated scattered subcortical and periventricular T2/FLAIR hyperintense lesions, unchanged compared to prior comparison MRI, and favored to represent sequela of chronic microvascular ischemic change. Redemonstrated chronic left thalamic infarct. Vascular: Normal flow voids. Skull and upper cervical spine: Normal marrow signal. Sinuses/Orbits: Redemonstrated extensive sinonasal mucosal disease with near-complete opacification of the bilateral ethmoid sinuses and increased air-fluid levels in the bilateral sphenoid sinuses. There is also increased right-sided mastoid effusion. IMPRESSION: 1. No acute intracranial process. 2. Redemonstrated extensive sinonasal mucosal disease with near-complete opacification of the bilateral ethmoid sinuses and increased air-fluid levels in the bilateral sphenoid sinuses, which can be seen in the setting of acute sinusitis. Electronically Signed   By: Marin Roberts M.D.   On: 09/24/2022 18:27   CT HEAD WO CONTRAST  Result Date: 09/24/2022 CLINICAL DATA:  Acute onset of right-sided facial, hand and foot numbness. EXAM: CT HEAD WITHOUT CONTRAST TECHNIQUE: Contiguous axial images were obtained from the base of the skull through the vertex without intravenous contrast. RADIATION DOSE REDUCTION: This exam was performed according to the departmental dose-optimization program which includes automated exposure control, adjustment of the mA and/or kV according to patient size and/or use of iterative reconstruction technique. COMPARISON:  CT on 06/17/2022 MRI on 06/19/2022. FINDINGS: Brain: No evidence of intracranial hemorrhage, acute infarction, hydrocephalus,  extra-axial collection, or mass lesion/mass effect. Left thalamic lacunar infarct is seen, as noted on MRI of 06/19/2022. Vascular:  No hyperdense vessel or other acute findings. Skull: No evidence of fracture or other significant bone abnormality. Sinuses/Orbits: Opacification of bilateral ethmoid and sphenoid sinuses is again seen, as well as mucosal thickening involving both maxillary sinuses. Other: None. IMPRESSION: No acute intracranial abnormality. Subacute left thalamic lacunar infarct, as seen on prior MRI of 06/19/2022. Chronic sinusitis. Electronically Signed   By: Marlaine Hind M.D.   On: 09/24/2022 15:24    Procedures Procedures    Medications Ordered in ED Medications  LORazepam (ATIVAN) injection 1 mg (has no administration in time range)  HYDROmorphone (DILAUDID) injection 1 mg (1 mg Intramuscular Given 09/24/22 1629)    ED Course/ Medical Decision Making/ A&P                           Medical Decision Making Amount and/or Complexity of Data Reviewed Radiology: ordered.  Risk Prescription drug management.   This patient presents to the ED with chief complaint(s) of right-sided weakness, right-sided numbness with pertinent past medical history of  stroke with similar symptoms.The complaint involves an extensive differential diagnosis and also carries with it a high risk of complications and morbidity.    The differential diagnosis includes : Recurrence of stroke, brain bleed, reactivation of old stroke, severe electrolyte abnormality  The initial plan is to get basic labs, CT scan of the brain, MRI of the brain.   Additional history obtained: Additional history obtained from family Records reviewed previous admission documents  Independent labs interpretation:  The following labs were independently interpreted: : CBC, BMP are reassuring.  Independent visualization and interpretation of imaging: - I independently visualized the following imaging with scope of  interpretation limited to determining acute life threatening conditions related to emergency care: CT scan of the brain, which revealed no evidence of brain bleed.  Treatment and Reassessment: Results of the MRI have returned.  MRI brain is negative for acute stroke.  Likely patient is having reactivation of old stroke.  Results of the ER work-up including MRI, basic blood work-up has been discussed with the patient.  He is stable for discharge.  Advised that he follows up with his outpatient neurologist in 1 to 2 weeks if the symptoms persist.   Consideration for admission or further workup: Admission was considered, however MRI is negative for stroke therefore we feel comfortable discharging the patient.    Final Clinical Impression(s) / ED Diagnoses Final diagnoses:  Stroke-like symptoms    Rx / DC Orders ED Discharge Orders     None         Varney Biles, MD 09/24/22 1918

## 2022-09-24 NOTE — ED Notes (Signed)
Pt transported to MRI 

## 2022-09-26 ENCOUNTER — Other Ambulatory Visit: Payer: Self-pay

## 2022-09-26 NOTE — Patient Outreach (Signed)
No telephone outreach to patient to obtain mRS. mRS was successfully completed by Dr. Leonie Man on 09/08/22. MRS=2   [place] Care Management Assistant [phone]

## 2022-11-01 ENCOUNTER — Emergency Department (HOSPITAL_BASED_OUTPATIENT_CLINIC_OR_DEPARTMENT_OTHER): Payer: Commercial Managed Care - HMO

## 2022-11-01 ENCOUNTER — Other Ambulatory Visit: Payer: Self-pay

## 2022-11-01 ENCOUNTER — Emergency Department (HOSPITAL_BASED_OUTPATIENT_CLINIC_OR_DEPARTMENT_OTHER)
Admission: EM | Admit: 2022-11-01 | Discharge: 2022-11-01 | Disposition: A | Discharge status: Home / Self Care | Payer: Commercial Managed Care - HMO | Attending: Emergency Medicine | Admitting: Emergency Medicine

## 2022-11-01 ENCOUNTER — Encounter (HOSPITAL_BASED_OUTPATIENT_CLINIC_OR_DEPARTMENT_OTHER): Payer: Self-pay

## 2022-11-01 DIAGNOSIS — R1011 Right upper quadrant pain: Secondary | ICD-10-CM | POA: Diagnosis present

## 2022-11-01 DIAGNOSIS — D72829 Elevated white blood cell count, unspecified: Secondary | ICD-10-CM | POA: Diagnosis not present

## 2022-11-01 DIAGNOSIS — R030 Elevated blood-pressure reading, without diagnosis of hypertension: Secondary | ICD-10-CM | POA: Diagnosis not present

## 2022-11-01 DIAGNOSIS — Z7984 Long term (current) use of oral hypoglycemic drugs: Secondary | ICD-10-CM | POA: Diagnosis not present

## 2022-11-01 DIAGNOSIS — R1012 Left upper quadrant pain: Secondary | ICD-10-CM | POA: Diagnosis not present

## 2022-11-01 DIAGNOSIS — Z8673 Personal history of transient ischemic attack (TIA), and cerebral infarction without residual deficits: Secondary | ICD-10-CM | POA: Insufficient documentation

## 2022-11-01 DIAGNOSIS — R1013 Epigastric pain: Secondary | ICD-10-CM | POA: Diagnosis not present

## 2022-11-01 DIAGNOSIS — R1010 Upper abdominal pain, unspecified: Secondary | ICD-10-CM

## 2022-11-01 DIAGNOSIS — R112 Nausea with vomiting, unspecified: Secondary | ICD-10-CM | POA: Diagnosis not present

## 2022-11-01 DIAGNOSIS — E119 Type 2 diabetes mellitus without complications: Secondary | ICD-10-CM | POA: Diagnosis not present

## 2022-11-01 DIAGNOSIS — Z7982 Long term (current) use of aspirin: Secondary | ICD-10-CM | POA: Diagnosis not present

## 2022-11-01 LAB — CBC WITH DIFFERENTIAL/PLATELET
Abs Immature Granulocytes: 0.04 10*3/uL (ref 0.00–0.07)
Basophils Absolute: 0.1 10*3/uL (ref 0.0–0.1)
Basophils Relative: 1 %
Eosinophils Absolute: 0.4 10*3/uL (ref 0.0–0.5)
Eosinophils Relative: 3 %
HCT: 54.2 % — ABNORMAL HIGH (ref 39.0–52.0)
Hemoglobin: 19 g/dL — ABNORMAL HIGH (ref 13.0–17.0)
Immature Granulocytes: 0 %
Lymphocytes Relative: 19 %
Lymphs Abs: 2.3 10*3/uL (ref 0.7–4.0)
MCH: 31.3 pg (ref 26.0–34.0)
MCHC: 35.1 g/dL (ref 30.0–36.0)
MCV: 89.3 fL (ref 80.0–100.0)
Monocytes Absolute: 0.5 10*3/uL (ref 0.1–1.0)
Monocytes Relative: 4 %
Neutro Abs: 8.6 10*3/uL — ABNORMAL HIGH (ref 1.7–7.7)
Neutrophils Relative %: 73 %
Platelets: 254 10*3/uL (ref 150–400)
RBC: 6.07 MIL/uL — ABNORMAL HIGH (ref 4.22–5.81)
RDW: 12.9 % (ref 11.5–15.5)
WBC: 11.8 10*3/uL — ABNORMAL HIGH (ref 4.0–10.5)
nRBC: 0 % (ref 0.0–0.2)

## 2022-11-01 LAB — COMPREHENSIVE METABOLIC PANEL
ALT: 21 U/L (ref 0–44)
AST: 21 U/L (ref 15–41)
Albumin: 3.9 g/dL (ref 3.5–5.0)
Alkaline Phosphatase: 61 U/L (ref 38–126)
Anion gap: 11 (ref 5–15)
BUN: 9 mg/dL (ref 6–20)
CO2: 22 mmol/L (ref 22–32)
Calcium: 8.9 mg/dL (ref 8.9–10.3)
Chloride: 104 mmol/L (ref 98–111)
Creatinine, Ser: 0.72 mg/dL (ref 0.61–1.24)
GFR, Estimated: >60 mL/min (ref >60)
Glucose, Bld: 190 mg/dL — ABNORMAL HIGH (ref 70–99)
Potassium: 3.7 mmol/L (ref 3.5–5.1)
Sodium: 137 mmol/L (ref 135–145)
Total Bilirubin: 1.9 mg/dL — ABNORMAL HIGH (ref 0.3–1.2)
Total Protein: 7.2 g/dL (ref 6.5–8.1)

## 2022-11-01 LAB — URINALYSIS, ROUTINE W REFLEX MICROSCOPIC
Bilirubin Urine: NEGATIVE
Glucose, UA: >=500 mg/dL — AB
Hgb urine dipstick: NEGATIVE
Ketones, ur: NEGATIVE mg/dL
Leukocytes,Ua: NEGATIVE
Nitrite: NEGATIVE
Protein, ur: NEGATIVE mg/dL
Specific Gravity, Urine: 1.015 (ref 1.005–1.030)
pH: 7 (ref 5.0–8.0)

## 2022-11-01 LAB — LIPASE, BLOOD: Lipase: 27 U/L (ref 11–51)

## 2022-11-01 LAB — URINALYSIS, MICROSCOPIC (REFLEX)
RBC / HPF: NONE SEEN RBC/hpf (ref 0–5)
WBC, UA: NONE SEEN WBC/hpf (ref 0–5)

## 2022-11-01 MED ORDER — MORPHINE SULFATE (PF) 4 MG/ML IV SOLN
4.0000 mg | Freq: Once | INTRAVENOUS | Status: AC
  Administered 2022-11-01: 4 mg via INTRAVENOUS
  Filled 2022-11-01: qty 1

## 2022-11-01 MED ORDER — ONDANSETRON HCL 4 MG/2ML IJ SOLN
4.0000 mg | Freq: Once | INTRAMUSCULAR | Status: AC
  Administered 2022-11-01: 4 mg via INTRAVENOUS
  Filled 2022-11-01: qty 2

## 2022-11-01 MED ORDER — ONDANSETRON HCL 4 MG PO TABS: 4.0000 mg | ORAL_TABLET | Freq: Three times a day (TID) | ORAL | 0 refills | Status: AC | PRN

## 2022-11-01 MED ORDER — PANTOPRAZOLE SODIUM 40 MG PO TBEC: 40.0000 mg | DELAYED_RELEASE_TABLET | Freq: Every day | ORAL | 0 refills | Status: DC

## 2022-11-01 MED ORDER — SODIUM CHLORIDE 0.9 % IV BOLUS
1000.0000 mL | Freq: Once | INTRAVENOUS | Status: AC
  Administered 2022-11-01: 1000 mL via INTRAVENOUS

## 2022-11-01 MED ORDER — IOHEXOL 300 MG/ML  SOLN
100.0000 mL | Freq: Once | INTRAMUSCULAR | Status: AC | PRN
  Administered 2022-11-01: 100 mL via INTRAVENOUS

## 2022-11-01 MED ORDER — PANTOPRAZOLE SODIUM 40 MG IV SOLR
40.0000 mg | Freq: Once | INTRAVENOUS | Status: AC
  Administered 2022-11-01: 40 mg via INTRAVENOUS
  Filled 2022-11-01: qty 10

## 2022-11-01 NOTE — ED Provider Notes (Signed)
[facility] EMERGENCY DEPARTMENT Provider Note   CSN: [id number] Arrival date & time: 11/01/22  1433     History {Add pertinent medical, surgical, social history, OB history to HPI:1} No chief complaint on file.   Theodore Hinton is a 56 y.o. male.  He has prior history of diabetes and stroke.  Complaining of 2 days of upper abdominal pain nausea and vomiting.  He feels it may be diverticulitis.  Denies any fevers or chills.  He said a few weeks ago he had "that thing" in his chest, chest congestion.  No diarrhea sometimes is constipated.  No urinary symptoms.  Has tried nothing for his symptoms.  The history is provided by the patient.  Abdominal Pain Pain location:  Epigastric, RUQ and LUQ Pain quality: aching   Pain severity:  Severe Onset quality:  Gradual Duration:  2 days Timing:  Constant Progression:  Unchanged Chronicity:  Recurrent Context: not trauma   Relieved by:  None tried Worsened by:  Nothing Ineffective treatments:  None tried Associated symptoms: constipation, nausea and vomiting   Associated symptoms: no chest pain, no cough, no diarrhea, no dysuria, no fever, no hematemesis and no shortness of breath        Home Medications Prior to Admission medications   Medication Sig Start Date End Date Taking? Authorizing Provider  amLODipine (NORVASC) 5 MG tablet Take 1 tablet (5 mg total) by mouth daily. 01/06/22   Derwood Kaplan, MD  aspirin EC 81 MG tablet Take 1 tablet (81 mg total) by mouth daily. Swallow whole. 09/08/22   Micki Riley, MD  atorvastatin (LIPITOR) 80 MG tablet Take 1 tablet (80 mg total) by mouth daily. 06/21/22   Bailey-Modzik, Delila A, NP  glipiZIDE (GLUCOTROL) 5 MG tablet Take 1 tablet (5 mg total) by mouth daily before breakfast. 06/20/22 09/18/22  Bailey-Modzik, Jillene Bucks A, NP      Allergies    Patient has no known allergies.    Review of Systems   Review of Systems  Constitutional:  Negative for fever.  Eyes:  Negative  for visual disturbance.  Respiratory:  Negative for cough and shortness of breath.   Cardiovascular:  Negative for chest pain.  Gastrointestinal:  Positive for abdominal pain, constipation, nausea and vomiting. Negative for diarrhea and hematemesis.  Genitourinary:  Negative for dysuria.    Physical Exam Updated Vital Signs BP (!) 188/115   Pulse 77   Temp 98 F (36.7 C) (Oral)   Resp 18   Ht 5\' 8"  (1.727 m)   Wt 83 kg   SpO2 98%   BMI 27.83 kg/m  Physical Exam Vitals and nursing note reviewed.  Constitutional:      General: He is not in acute distress.    Appearance: Normal appearance. He is well-developed.  HENT:     Head: Normocephalic and atraumatic.  Eyes:     Conjunctiva/sclera: Conjunctivae normal.  Cardiovascular:     Rate and Rhythm: Normal rate and regular rhythm.     Heart sounds: No murmur heard. Pulmonary:     Effort: Pulmonary effort is normal. No respiratory distress.     Breath sounds: Normal breath sounds.  Abdominal:     Palpations: Abdomen is soft. There is no mass.     Tenderness: There is abdominal tenderness. There is no guarding or rebound.     Hernia: No hernia is present.  Musculoskeletal:        General: No swelling.     Cervical back: Neck supple.  Skin:    General: Skin is warm and dry.     Capillary Refill: Capillary refill takes less than 2 seconds.  Neurological:     General: No focal deficit present.     Mental Status: He is alert.     ED Results / Procedures / Treatments   Labs (all labs ordered are listed, but only abnormal results are displayed) Labs Reviewed  COMPREHENSIVE METABOLIC PANEL - Abnormal; Notable for the following components:      Result Value   Glucose, Bld 190 (*)    Total Bilirubin 1.9 (*)    All other components within normal limits  URINALYSIS, ROUTINE W REFLEX MICROSCOPIC - Abnormal; Notable for the following components:   Glucose, UA >=500 (*)    All other components within normal limits  CBC WITH  DIFFERENTIAL/PLATELET - Abnormal; Notable for the following components:   WBC 11.8 (*)    RBC 6.07 (*)    Hemoglobin 19.0 (*)    HCT 54.2 (*)    Neutro Abs 8.6 (*)    All other components within normal limits  URINALYSIS, MICROSCOPIC (REFLEX) - Abnormal; Notable for the following components:   Bacteria, UA RARE (*)    All other components within normal limits  LIPASE, BLOOD    EKG None  Radiology No results found.  Procedures Procedures  {Document cardiac monitor, telemetry assessment procedure when appropriate:1}  Medications Ordered in ED Medications  sodium chloride 0.9 % bolus 1,000 mL (has no administration in time range)  ondansetron (ZOFRAN) injection 4 mg (has no administration in time range)  morphine (PF) 4 MG/ML injection 4 mg (has no administration in time range)    ED Course/ Medical Decision Making/ A&P                           Medical Decision Making Amount and/or Complexity of Data Reviewed Labs: ordered. Radiology: ordered.  Risk Prescription drug management.   This patient complains of ***; this involves an extensive number of treatment Options and is a complaint that carries with it a high risk of complications and morbidity. The differential includes ***  I ordered, reviewed and interpreted labs, which included *** I ordered medication *** and reviewed PMP when indicated. I ordered imaging studies which included *** and I independently    visualized and interpreted imaging which showed *** Additional history obtained from *** Previous records obtained and reviewed *** I consulted *** and discussed lab and imaging findings and discussed disposition.  Cardiac monitoring reviewed, *** Social determinants considered, *** Critical Interventions: ***  After the interventions stated above, I reevaluated the patient and found *** Admission and further testing considered, ***   {Document critical care time when appropriate:1} {Document review  of labs and clinical decision tools ie heart score, Chads2Vasc2 etc:1}  {Document your independent review of radiology images, and any outside records:1} {Document your discussion with family members, caretakers, and with consultants:1} {Document social determinants of health affecting pt's care:1} {Document your decision making why or why not admission, treatments were needed:1} Final Clinical Impression(s) / ED Diagnoses Final diagnoses:  None    Rx / DC Orders ED Discharge Orders     None

## 2022-11-01 NOTE — ED Triage Notes (Signed)
C/o abdominal pain, vomiting x 2 days. Hx of diverticulitis, feels the same. Unable to tolerate PO.

## 2022-11-01 NOTE — Discharge Instructions (Addendum)
You are seen in the emergency department for abdominal pain nausea and vomiting.  Your blood pressure was quite elevated here.  Your lab work did not show an obvious explanation for your symptoms.  You had a CAT scan that showed some enlargement of your bile duct but otherwise no explanation for your pain.  We are starting you on some acid medication and nausea medication.  Please follow-up with your regular doctor for further evaluation.  Return to the emergency department if any high fevers or worsening symptoms.

## 2022-12-17 DIAGNOSIS — G8929 Other chronic pain: Secondary | ICD-10-CM | POA: Diagnosis not present

## 2022-12-17 DIAGNOSIS — R1084 Generalized abdominal pain: Secondary | ICD-10-CM | POA: Diagnosis not present

## 2022-12-17 DIAGNOSIS — M545 Low back pain, unspecified: Secondary | ICD-10-CM | POA: Diagnosis not present

## 2022-12-19 ENCOUNTER — Encounter (HOSPITAL_COMMUNITY): Payer: Self-pay

## 2022-12-19 ENCOUNTER — Other Ambulatory Visit: Payer: Self-pay

## 2022-12-19 ENCOUNTER — Emergency Department (HOSPITAL_COMMUNITY): Payer: Medicaid Other

## 2022-12-19 ENCOUNTER — Observation Stay (HOSPITAL_COMMUNITY)
Admission: EM | Admit: 2022-12-19 | Discharge: 2022-12-20 | Disposition: A | Discharge status: Home / Self Care | Payer: Medicaid Other | Attending: Internal Medicine | Admitting: Internal Medicine

## 2022-12-19 DIAGNOSIS — K639 Disease of intestine, unspecified: Secondary | ICD-10-CM | POA: Diagnosis present

## 2022-12-19 DIAGNOSIS — R739 Hyperglycemia, unspecified: Secondary | ICD-10-CM

## 2022-12-19 DIAGNOSIS — M5441 Lumbago with sciatica, right side: Principal | ICD-10-CM | POA: Insufficient documentation

## 2022-12-19 DIAGNOSIS — J3489 Other specified disorders of nose and nasal sinuses: Secondary | ICD-10-CM | POA: Diagnosis not present

## 2022-12-19 DIAGNOSIS — K5790 Diverticulosis of intestine, part unspecified, without perforation or abscess without bleeding: Secondary | ICD-10-CM | POA: Diagnosis present

## 2022-12-19 DIAGNOSIS — Z7982 Long term (current) use of aspirin: Secondary | ICD-10-CM

## 2022-12-19 DIAGNOSIS — Z9181 History of falling: Secondary | ICD-10-CM

## 2022-12-19 DIAGNOSIS — K6389 Other specified diseases of intestine: Secondary | ICD-10-CM | POA: Insufficient documentation

## 2022-12-19 DIAGNOSIS — Z79899 Other long term (current) drug therapy: Secondary | ICD-10-CM

## 2022-12-19 DIAGNOSIS — R531 Weakness: Principal | ICD-10-CM

## 2022-12-19 DIAGNOSIS — I63312 Cerebral infarction due to thrombosis of left middle cerebral artery: Secondary | ICD-10-CM

## 2022-12-19 DIAGNOSIS — R29818 Other symptoms and signs involving the nervous system: Secondary | ICD-10-CM | POA: Diagnosis not present

## 2022-12-19 DIAGNOSIS — I6601 Occlusion and stenosis of right middle cerebral artery: Secondary | ICD-10-CM | POA: Diagnosis not present

## 2022-12-19 DIAGNOSIS — I10 Essential (primary) hypertension: Secondary | ICD-10-CM | POA: Insufficient documentation

## 2022-12-19 DIAGNOSIS — M545 Low back pain, unspecified: Secondary | ICD-10-CM | POA: Diagnosis not present

## 2022-12-19 DIAGNOSIS — E1165 Type 2 diabetes mellitus with hyperglycemia: Secondary | ICD-10-CM | POA: Diagnosis present

## 2022-12-19 DIAGNOSIS — E871 Hypo-osmolality and hyponatremia: Secondary | ICD-10-CM | POA: Diagnosis present

## 2022-12-19 DIAGNOSIS — I63233 Cerebral infarction due to unspecified occlusion or stenosis of bilateral carotid arteries: Secondary | ICD-10-CM | POA: Diagnosis not present

## 2022-12-19 DIAGNOSIS — E876 Hypokalemia: Secondary | ICD-10-CM | POA: Diagnosis present

## 2022-12-19 DIAGNOSIS — I639 Cerebral infarction, unspecified: Secondary | ICD-10-CM | POA: Diagnosis present

## 2022-12-19 DIAGNOSIS — Z7984 Long term (current) use of oral hypoglycemic drugs: Secondary | ICD-10-CM

## 2022-12-19 DIAGNOSIS — R202 Paresthesia of skin: Secondary | ICD-10-CM | POA: Diagnosis present

## 2022-12-19 DIAGNOSIS — Z7902 Long term (current) use of antithrombotics/antiplatelets: Secondary | ICD-10-CM

## 2022-12-19 DIAGNOSIS — I6381 Other cerebral infarction due to occlusion or stenosis of small artery: Secondary | ICD-10-CM | POA: Diagnosis present

## 2022-12-19 DIAGNOSIS — E119 Type 2 diabetes mellitus without complications: Secondary | ICD-10-CM | POA: Diagnosis not present

## 2022-12-19 DIAGNOSIS — R29810 Facial weakness: Secondary | ICD-10-CM

## 2022-12-19 DIAGNOSIS — M5430 Sciatica, unspecified side: Secondary | ICD-10-CM | POA: Insufficient documentation

## 2022-12-19 DIAGNOSIS — F17210 Nicotine dependence, cigarettes, uncomplicated: Secondary | ICD-10-CM | POA: Diagnosis present

## 2022-12-19 DIAGNOSIS — Z8673 Personal history of transient ischemic attack (TIA), and cerebral infarction without residual deficits: Secondary | ICD-10-CM | POA: Diagnosis not present

## 2022-12-19 DIAGNOSIS — R29705 NIHSS score 5: Secondary | ICD-10-CM | POA: Diagnosis present

## 2022-12-19 DIAGNOSIS — Z794 Long term (current) use of insulin: Secondary | ICD-10-CM

## 2022-12-19 DIAGNOSIS — G8191 Hemiplegia, unspecified affecting right dominant side: Secondary | ICD-10-CM | POA: Diagnosis present

## 2022-12-19 DIAGNOSIS — I69398 Other sequelae of cerebral infarction: Secondary | ICD-10-CM

## 2022-12-19 DIAGNOSIS — M4800 Spinal stenosis, site unspecified: Secondary | ICD-10-CM | POA: Diagnosis present

## 2022-12-19 DIAGNOSIS — H532 Diplopia: Secondary | ICD-10-CM | POA: Diagnosis present

## 2022-12-19 DIAGNOSIS — S3992XA Unspecified injury of lower back, initial encounter: Secondary | ICD-10-CM | POA: Diagnosis not present

## 2022-12-19 DIAGNOSIS — G8929 Other chronic pain: Secondary | ICD-10-CM | POA: Diagnosis present

## 2022-12-19 DIAGNOSIS — E785 Hyperlipidemia, unspecified: Secondary | ICD-10-CM | POA: Diagnosis present

## 2022-12-19 HISTORY — DX: Cerebral infarction, unspecified: I63.9

## 2022-12-19 LAB — DIFFERENTIAL
Abs Immature Granulocytes: 0.04 10*3/uL (ref 0.00–0.07)
Basophils Absolute: 0 10*3/uL (ref 0.0–0.1)
Basophils Relative: 0 %
Eosinophils Absolute: 0.2 10*3/uL (ref 0.0–0.5)
Eosinophils Relative: 2 %
Immature Granulocytes: 0 %
Lymphocytes Relative: 22 %
Lymphs Abs: 2.3 10*3/uL (ref 0.7–4.0)
Monocytes Absolute: 0.5 10*3/uL (ref 0.1–1.0)
Monocytes Relative: 5 %
Neutro Abs: 7.3 10*3/uL (ref 1.7–7.7)
Neutrophils Relative %: 71 %

## 2022-12-19 LAB — COMPREHENSIVE METABOLIC PANEL
ALT: 19 U/L (ref 0–44)
AST: 23 U/L (ref 15–41)
Albumin: 3.3 g/dL — ABNORMAL LOW (ref 3.5–5.0)
Alkaline Phosphatase: 51 U/L (ref 38–126)
Anion gap: 11 (ref 5–15)
BUN: 20 mg/dL (ref 6–20)
CO2: 22 mmol/L (ref 22–32)
Calcium: 8.8 mg/dL — ABNORMAL LOW (ref 8.9–10.3)
Chloride: 98 mmol/L (ref 98–111)
Creatinine, Ser: 0.82 mg/dL (ref 0.61–1.24)
GFR, Estimated: >60 mL/min (ref >60)
Glucose, Bld: 451 mg/dL — ABNORMAL HIGH (ref 70–99)
Potassium: 3.3 mmol/L — ABNORMAL LOW (ref 3.5–5.1)
Sodium: 131 mmol/L — ABNORMAL LOW (ref 135–145)
Total Bilirubin: 1.7 mg/dL — ABNORMAL HIGH (ref 0.3–1.2)
Total Protein: 5.9 g/dL — ABNORMAL LOW (ref 6.5–8.1)

## 2022-12-19 LAB — I-STAT CHEM 8, ED
BUN: 21 mg/dL — ABNORMAL HIGH (ref 6–20)
Calcium, Ion: 1.01 mmol/L — ABNORMAL LOW (ref 1.15–1.40)
Chloride: 97 mmol/L — ABNORMAL LOW (ref 98–111)
Creatinine, Ser: 0.6 mg/dL — ABNORMAL LOW (ref 0.61–1.24)
Glucose, Bld: 454 mg/dL — ABNORMAL HIGH (ref 70–99)
HCT: 46 % (ref 39.0–52.0)
Hemoglobin: 15.6 g/dL (ref 13.0–17.0)
Potassium: 3.6 mmol/L (ref 3.5–5.1)
Sodium: 133 mmol/L — ABNORMAL LOW (ref 135–145)
TCO2: 22 mmol/L (ref 22–32)

## 2022-12-19 LAB — GLUCOSE, CAPILLARY: Glucose-Capillary: 206 mg/dL — ABNORMAL HIGH (ref 70–99)

## 2022-12-19 LAB — CBC
HCT: 45 % (ref 39.0–52.0)
Hemoglobin: 15.7 g/dL (ref 13.0–17.0)
MCH: 31.1 pg (ref 26.0–34.0)
MCHC: 34.9 g/dL (ref 30.0–36.0)
MCV: 89.1 fL (ref 80.0–100.0)
Platelets: 191 10*3/uL (ref 150–400)
RBC: 5.05 MIL/uL (ref 4.22–5.81)
RDW: 12.2 % (ref 11.5–15.5)
WBC: 10.4 10*3/uL (ref 4.0–10.5)
nRBC: 0 % (ref 0.0–0.2)

## 2022-12-19 LAB — PROTIME-INR
INR: 1 (ref 0.8–1.2)
Prothrombin Time: 13.5 seconds (ref 11.4–15.2)

## 2022-12-19 LAB — ETHANOL: Alcohol, Ethyl (B): <10 mg/dL (ref <10)

## 2022-12-19 LAB — APTT: aPTT: 23 seconds — ABNORMAL LOW (ref 24–36)

## 2022-12-19 LAB — CBG MONITORING, ED: Glucose-Capillary: 388 mg/dL — ABNORMAL HIGH (ref 70–99)

## 2022-12-19 MED ORDER — SENNOSIDES-DOCUSATE SODIUM 8.6-50 MG PO TABS: 1.0000 | ORAL_TABLET | Freq: Every evening | ORAL | Status: DC | PRN

## 2022-12-19 MED ORDER — HYDRALAZINE HCL 20 MG/ML IJ SOLN
5.0000 mg | Freq: Four times a day (QID) | INTRAMUSCULAR | Status: DC | PRN
  Administered 2022-12-19: 5 mg via INTRAVENOUS
  Filled 2022-12-19: qty 1

## 2022-12-19 MED ORDER — LACTATED RINGERS IV BOLUS
1000.0000 mL | Freq: Once | INTRAVENOUS | Status: AC
  Administered 2022-12-19: 1000 mL via INTRAVENOUS

## 2022-12-19 MED ORDER — METOPROLOL TARTRATE 25 MG PO TABS
50.0000 mg | ORAL_TABLET | Freq: Once | ORAL | Status: DC
  Filled 2022-12-19: qty 2

## 2022-12-19 MED ORDER — AMLODIPINE BESYLATE 10 MG PO TABS
10.0000 mg | ORAL_TABLET | Freq: Every day | ORAL | Status: DC
  Administered 2022-12-19 – 2022-12-20 (×2): 10 mg via ORAL
  Filled 2022-12-19: qty 2
  Filled 2022-12-19: qty 1

## 2022-12-19 MED ORDER — STROKE: EARLY STAGES OF RECOVERY BOOK
Freq: Once | Status: AC
  Administered 2022-12-20: 1
  Filled 2022-12-19: qty 1

## 2022-12-19 MED ORDER — ASPIRIN 81 MG PO TBEC: 81.0000 mg | DELAYED_RELEASE_TABLET | Freq: Every day | ORAL | Status: DC

## 2022-12-19 MED ORDER — FENTANYL CITRATE PF 50 MCG/ML IJ SOSY
50.0000 ug | PREFILLED_SYRINGE | Freq: Once | INTRAMUSCULAR | Status: AC
  Administered 2022-12-19: 50 ug via INTRAVENOUS
  Filled 2022-12-19: qty 1

## 2022-12-19 MED ORDER — HYDROMORPHONE HCL 1 MG/ML IJ SOLN
0.5000 mg | INTRAMUSCULAR | Status: AC | PRN
  Administered 2022-12-19 – 2022-12-20 (×2): 0.5 mg via INTRAVENOUS
  Filled 2022-12-19: qty 1
  Filled 2022-12-19: qty 0.5

## 2022-12-19 MED ORDER — KETOROLAC TROMETHAMINE 30 MG/ML IJ SOLN: 30.0000 mg | Freq: Once | INTRAMUSCULAR | Status: DC

## 2022-12-19 MED ORDER — INSULIN GLARGINE-YFGN 100 UNIT/ML ~~LOC~~ SOLN
15.0000 [IU] | Freq: Every day | SUBCUTANEOUS | Status: DC
  Administered 2022-12-19 – 2022-12-20 (×2): 15 [IU] via SUBCUTANEOUS
  Filled 2022-12-19 (×3): qty 0.15

## 2022-12-19 MED ORDER — INSULIN ASPART 100 UNIT/ML IJ SOLN
0.0000 [IU] | Freq: Every day | INTRAMUSCULAR | Status: DC
  Administered 2022-12-19: 2 [IU] via SUBCUTANEOUS

## 2022-12-19 MED ORDER — ASPIRIN 81 MG PO TBEC
81.0000 mg | DELAYED_RELEASE_TABLET | Freq: Every day | ORAL | Status: DC
  Administered 2022-12-19 – 2022-12-20 (×2): 81 mg via ORAL
  Filled 2022-12-19 (×2): qty 1

## 2022-12-19 MED ORDER — OXYCODONE HCL 5 MG PO TABS
5.0000 mg | ORAL_TABLET | ORAL | Status: DC | PRN
  Administered 2022-12-19 – 2022-12-20 (×3): 5 mg via ORAL
  Filled 2022-12-19 (×3): qty 1

## 2022-12-19 MED ORDER — CLOPIDOGREL BISULFATE 75 MG PO TABS
75.0000 mg | ORAL_TABLET | Freq: Every day | ORAL | Status: DC
  Administered 2022-12-19 – 2022-12-20 (×2): 75 mg via ORAL
  Filled 2022-12-19 (×2): qty 1

## 2022-12-19 MED ORDER — INSULIN ASPART 100 UNIT/ML IJ SOLN
0.0000 [IU] | Freq: Three times a day (TID) | INTRAMUSCULAR | Status: DC
  Administered 2022-12-20: 11 [IU] via SUBCUTANEOUS
  Administered 2022-12-20: 4 [IU] via SUBCUTANEOUS

## 2022-12-19 MED ORDER — ATORVASTATIN CALCIUM 80 MG PO TABS
80.0000 mg | ORAL_TABLET | Freq: Every day | ORAL | Status: DC
  Administered 2022-12-20: 80 mg via ORAL
  Filled 2022-12-19: qty 1

## 2022-12-19 MED ORDER — ENOXAPARIN SODIUM 40 MG/0.4ML IJ SOSY
40.0000 mg | PREFILLED_SYRINGE | INTRAMUSCULAR | Status: DC
  Administered 2022-12-19: 40 mg via SUBCUTANEOUS
  Filled 2022-12-19: qty 0.4

## 2022-12-19 MED ORDER — SODIUM CHLORIDE 0.9% FLUSH: 3.0000 mL | Freq: Once | INTRAVENOUS | Status: DC

## 2022-12-19 MED ORDER — NICOTINE 14 MG/24HR TD PT24
14.0000 mg | MEDICATED_PATCH | Freq: Every day | TRANSDERMAL | Status: DC
  Filled 2022-12-19 (×2): qty 1

## 2022-12-19 MED ORDER — GABAPENTIN 100 MG PO CAPS
100.0000 mg | ORAL_CAPSULE | Freq: Three times a day (TID) | ORAL | Status: DC
  Administered 2022-12-19: 100 mg via ORAL
  Filled 2022-12-19: qty 1

## 2022-12-19 MED ORDER — HYDRALAZINE HCL 20 MG/ML IJ SOLN
10.0000 mg | Freq: Once | INTRAMUSCULAR | Status: AC
  Administered 2022-12-19: 10 mg via INTRAVENOUS
  Filled 2022-12-19: qty 1

## 2022-12-19 MED ORDER — IOHEXOL 350 MG/ML SOLN
75.0000 mL | Freq: Once | INTRAVENOUS | Status: AC | PRN
  Administered 2022-12-19: 75 mL via INTRAVENOUS

## 2022-12-19 MED ORDER — PANTOPRAZOLE SODIUM 40 MG PO TBEC
40.0000 mg | DELAYED_RELEASE_TABLET | Freq: Every day | ORAL | Status: DC
  Administered 2022-12-20: 40 mg via ORAL
  Filled 2022-12-19: qty 1

## 2022-12-19 MED ORDER — KETOROLAC TROMETHAMINE 30 MG/ML IJ SOLN
30.0000 mg | Freq: Once | INTRAMUSCULAR | Status: AC
  Administered 2022-12-19: 30 mg via INTRAVENOUS
  Filled 2022-12-19: qty 1

## 2022-12-19 MED ORDER — LISINOPRIL 20 MG PO TABS
20.0000 mg | ORAL_TABLET | Freq: Once | ORAL | Status: AC
  Administered 2022-12-19: 20 mg via ORAL
  Filled 2022-12-19: qty 1

## 2022-12-19 MED ORDER — AMLODIPINE BESYLATE 5 MG PO TABS: 5.0000 mg | ORAL_TABLET | Freq: Every day | ORAL | Status: DC

## 2022-12-19 MED ORDER — ACETAMINOPHEN 160 MG/5ML PO SOLN: 650.0000 mg | ORAL | Status: DC | PRN

## 2022-12-19 MED ORDER — LISINOPRIL 20 MG PO TABS
20.0000 mg | ORAL_TABLET | Freq: Every day | ORAL | Status: DC
  Administered 2022-12-20: 20 mg via ORAL
  Filled 2022-12-19 (×2): qty 1

## 2022-12-19 MED ORDER — METHOCARBAMOL 500 MG PO TABS
500.0000 mg | ORAL_TABLET | Freq: Four times a day (QID) | ORAL | Status: DC | PRN
  Administered 2022-12-19 – 2022-12-20 (×3): 500 mg via ORAL
  Filled 2022-12-19 (×3): qty 1

## 2022-12-19 MED ORDER — ACETAMINOPHEN 325 MG PO TABS
650.0000 mg | ORAL_TABLET | ORAL | Status: DC | PRN
  Administered 2022-12-20 (×2): 650 mg via ORAL
  Filled 2022-12-19 (×2): qty 2

## 2022-12-19 MED ORDER — ACETAMINOPHEN 325 MG PO TABS
650.0000 mg | ORAL_TABLET | Freq: Four times a day (QID) | ORAL | Status: DC | PRN
  Administered 2022-12-19: 650 mg via ORAL
  Filled 2022-12-19: qty 2

## 2022-12-19 MED ORDER — ACETAMINOPHEN 650 MG RE SUPP: 650.0000 mg | RECTAL | Status: DC | PRN

## 2022-12-19 NOTE — ED Notes (Addendum)
ED TO INPATIENT HANDOFF REPORT  ED Nurse Name and Phone #: Jaci Lazier [phone]  S Name/Age/Gender Theodore Hinton 57 y.o. male Room/Bed: 031C/031C  Code Status   Code Status: Full Code  Home/SNF/Other Home Patient oriented to: self, place, time, and situation Is this baseline? Yes   Triage Complete: Triage complete  Chief Complaint Weakness [R53.1] Facial droop [R29.810] Hyperglycemia [R73.9] Stroke [facility]) [I63.9]  Triage Note Pt c/o R sided weakness, falling twice Friday, R sided facial droop, numbness of R face; pt states symptoms began Friday; hx stroke 9 months ago, states he always has some numbness of right face but has increased last couple of days; pt endorses compliance with blood thinners; denies hitting head; also endorses low back pain, hx chronic back pain, worse after falling   Allergies No Known Allergies  Level of Care/Admitting Diagnosis ED Disposition     ED Disposition  Admit   Condition  --   Comment  Hospital Area: [facility] [[id number]]  Level of Care: Telemetry Medical [[id number]]  May admit patient to [facility] or [facility] if equivalent level of care is available:: No  Covid Evaluation: Asymptomatic - no recent exposure (last 10 days) testing not required  Diagnosis: Stroke [facility]) [[id number]]  Admitting Physician: [facility] [[id number]]  Attending Physician: [facility] [[id number]]  Certification:: I certify this patient will need inpatient services for at least 2 midnights  Estimated Length of Stay: 2          B Medical/Surgery History Past Medical History:  Diagnosis Date   Back injury    Chronic back pain    Diabetes mellitus without complication (HCC)    Diverticulosis    Hypertension    Stroke [facility])    History reviewed. No pertinent surgical history.   A IV Location/Drains/Wounds Patient Lines/Drains/Airways Status     Active Line/Drains/Airways     Name Placement date Placement time Site Days    Peripheral IV 12/19/22 Anterior;Proximal;Right Antecubital 12/19/22  1523  Antecubital  less than 1            Intake/Output Last 24 hours  Intake/Output Summary (Last 24 hours) at 12/19/2022 2118 Last data filed at 12/19/2022 1027 Gross per 24 hour  Intake 1000 ml  Output --  Net 1000 ml    Labs/Imaging Results for orders placed or performed during the hospital encounter of 12/19/22 (from the past 48 hour(s))  Protime-INR     Status: None   Collection Time: 12/19/22  1:39 PM  Result Value Ref Range   Prothrombin Time 13.5 11.4 - 15.2 seconds   INR 1.0 0.8 - 1.2    Comment: (NOTE) INR goal varies based on device and disease states. Performed at [facility], [address], [place], [place] [address]   APTT     Status: Abnormal   Collection Time: 12/19/22  1:39 PM  Result Value Ref Range   aPTT 23 (L) 24 - 36 seconds    Comment: Performed at [facility], [address], [place], [place] [address]  CBC     Status: None   Collection Time: 12/19/22  1:39 PM  Result Value Ref Range   WBC 10.4 4.0 - 10.5 K/uL   RBC 5.05 4.22 - 5.81 MIL/uL   Hemoglobin 15.7 13.0 - 17.0 g/dL   HCT 74.2 59.5 - 63.8 %   MCV 89.1 80.0 - 100.0 fL   MCH 31.1 26.0 - 34.0 pg   MCHC  34.9 30.0 - 36.0 g/dL   RDW 12.2 11.5 - 15.5 %   Platelets 191 150 - 400 K/uL   nRBC 0.0 0.0 - 0.2 %    Comment: Performed at [facility], [place] [address] [place], [place] [address]  Differential     Status: None   Collection Time: 12/19/22  1:39 PM  Result Value Ref Range   Neutrophils Relative % 71 %   Neutro Abs 7.3 1.7 - 7.7 K/uL   Lymphocytes Relative 22 %   Lymphs Abs 2.3 0.7 - 4.0 K/uL   Monocytes Relative 5 %   Monocytes Absolute 0.5 0.1 - 1.0 K/uL   Eosinophils Relative 2 %   Eosinophils Absolute 0.2 0.0 - 0.5 K/uL   Basophils Relative 0 %   Basophils Absolute 0.0 0.0 - 0.1 K/uL   Immature Granulocytes 0 %   Abs Immature Granulocytes 0.04 0.00 - 0.07 K/uL    Comment: Performed at  [place] [address], [place], [place] [address]  Comprehensive metabolic panel     Status: Abnormal   Collection Time: 12/19/22  1:39 PM  Result Value Ref Range   Sodium 131 (L) 135 - 145 mmol/L   Potassium 3.3 (L) 3.5 - 5.1 mmol/L   Chloride 98 98 - 111 mmol/L   CO2 22 22 - 32 mmol/L   Glucose, Bld 451 (H) 70 - 99 mg/dL    Comment: Glucose reference range applies only to samples taken after fasting for at least 8 hours.   BUN 20 6 - 20 mg/dL   Creatinine, Ser 0.82 0.61 - 1.24 mg/dL   Calcium 8.8 (L) 8.9 - 10.3 mg/dL   Total Protein 5.9 (L) 6.5 - 8.1 g/dL   Albumin 3.3 (L) 3.5 - 5.0 g/dL   AST 23 15 - 41 U/L   ALT 19 0 - 44 U/L   Alkaline Phosphatase 51 38 - 126 U/L   Total Bilirubin 1.7 (H) 0.3 - 1.2 mg/dL   GFR, Estimated >60 >60 mL/min    Comment: (NOTE) Calculated using the CKD-EPI Creatinine Equation (2021)    Anion gap 11 5 - 15    Comment: Performed at [facility], [place] [address], [place], [place] [address]  Ethanol     Status: None   Collection Time: 12/19/22  1:39 PM  Result Value Ref Range   Alcohol, Ethyl (B) <10 <10 mg/dL    Comment: (NOTE) Lowest detectable limit for serum alcohol is 10 mg/dL.  For medical purposes only. Performed at [facility], [place] [address] [place], [place] [address]   I-stat chem 8, ED     Status: Abnormal   Collection Time: 12/19/22  1:55 PM  Result Value Ref Range   Sodium 133 (L) 135 - 145 mmol/L   Potassium 3.6 3.5 - 5.1 mmol/L   Chloride 97 (L) 98 - 111 mmol/L   BUN 21 (H) 6 - 20 mg/dL   Creatinine, Ser 0.60 (L) 0.61 - 1.24 mg/dL   Glucose, Bld 454 (H) 70 - 99 mg/dL    Comment: Glucose reference range applies only to samples taken after fasting for at least 8 hours.   Calcium, Ion 1.01 (L) 1.15 - 1.40 mmol/L   TCO2 22 22 - 32 mmol/L   Hemoglobin 15.6 13.0 - 17.0 g/dL   HCT 46.0 39.0 - 52.0 %  CBG monitoring, ED     Status: Abnormal   Collection Time: 12/19/22  2:02 PM  Result Value Ref Range    Glucose-Capillary 388 (H) 70 - 99 mg/dL    Comment: Glucose reference range applies only to samples taken after fasting for at least 8 hours.   CT ANGIO HEAD NECK W WO CM  Result Date: 12/19/2022 CLINICAL DATA:  Stroke/TIA EXAM: CT ANGIOGRAPHY HEAD AND NECK TECHNIQUE: Multidetector CT imaging of the head and neck was performed using the standard protocol during bolus administration of intravenous contrast. Multiplanar CT image reconstructions and MIPs were obtained to evaluate the vascular anatomy. Carotid stenosis measurements (when applicable) are obtained utilizing NASCET criteria, using the distal internal carotid diameter as the denominator. RADIATION DOSE REDUCTION: This exam was performed according to the departmental dose-optimization program which includes automated exposure control, adjustment of the mA and/or kV according to patient size and/or use of iterative reconstruction technique. CONTRAST:  47mL OMNIPAQUE IOHEXOL 350 MG/ML SOLN COMPARISON:  CTA head neck 06/18/2022 head CT 09/24/2022 Brain MRI 12/22/2022 FINDINGS: CT HEAD FINDINGS Brain: There is no mass, hemorrhage or extra-axial collection. The size and configuration of the ventricles and extra-axial CSF spaces are normal. There is no acute or chronic infarction. The brain parenchyma is normal. Skull: The visualized skull base, calvarium and extracranial soft tissues are normal. Sinuses/Orbits: No fluid levels or advanced mucosal thickening of the visualized paranasal sinuses. No mastoid or middle ear effusion. The orbits are normal. CTA NECK FINDINGS SKELETON: There is no bony spinal canal stenosis. No lytic or blastic lesion. OTHER NECK: Normal pharynx, larynx and major salivary glands. No cervical lymphadenopathy. Unremarkable thyroid gland. UPPER CHEST: No pneumothorax or pleural effusion. No nodules or masses. AORTIC ARCH: There is no calcific atherosclerosis of the aortic arch. There is no aneurysm, dissection or hemodynamically  significant stenosis of the visualized portion of the aorta. Conventional 3 vessel aortic branching pattern. The visualized proximal subclavian arteries are widely patent. RIGHT CAROTID SYSTEM: No dissection, occlusion or aneurysm. Mild atherosclerotic calcification at the carotid bifurcation without hemodynamically significant stenosis. LEFT CAROTID SYSTEM: No dissection, occlusion or aneurysm. Mild atherosclerotic calcification at the carotid bifurcation without hemodynamically significant stenosis. VERTEBRAL ARTERIES: Codominant configuration. Both origins are clearly patent. There is no dissection, occlusion or flow-limiting stenosis to the skull base (V1-V3 segments). CTA HEAD FINDINGS POSTERIOR CIRCULATION: --Vertebral arteries: Mild bilateral atherosclerotic calcification without stenosis. Five vertebrobasilar confluence. --Inferior cerebellar arteries: Normal. --Basilar artery: Normal. --Superior cerebellar arteries: Normal. --Posterior cerebral arteries (PCA): Unchanged severe stenosis of the proximal P2 segment of the right PCA. ANTERIOR CIRCULATION: --Intracranial internal carotid arteries: Atherosclerotic calcification of the internal carotid arteries at the skull base without hemodynamically significant stenosis. --Anterior cerebral arteries (ACA): Normal. Both A1 segments are present. Patent anterior communicating artery (a-comm). --Middle cerebral arteries (MCA): Normal. VENOUS SINUSES: As permitted by contrast timing, patent. ANATOMIC VARIANTS: None Review of the MIP images confirms the above findings. IMPRESSION: 1. No emergent large vessel occlusion. 2. Unchanged severe stenosis of the proximal P2 segment of the right PCA. 3. Mild bilateral carotid bifurcation atherosclerosis without hemodynamically significant stenosis. Electronically Signed   By: Deatra Robinson M.D.   On: 12/19/2022 19:42   CT Lumbar Spine Wo Contrast  Result Date: 12/19/2022 CLINICAL DATA:  Back trauma, no prior imaging (Age  >= 16y). Low back pain, worse after a fall. EXAM: CT LUMBAR SPINE WITHOUT CONTRAST TECHNIQUE: Multidetector CT imaging of the lumbar spine was performed without intravenous contrast administration. Multiplanar CT image reconstructions were also generated. RADIATION DOSE REDUCTION: This exam was performed according to the departmental dose-optimization program which  includes automated exposure control, adjustment of the mA and/or kV according to patient size and/or use of iterative reconstruction technique. COMPARISON:  CT abdomen and pelvis 11/01/2022. MRI lumbar spine 07/19/2016. FINDINGS: Segmentation: 5 lumbar type vertebrae. Alignment: Chronic trace retrolisthesis of L2 on L3 and L3 on L4. Vertebrae: Unchanged mild chronic L1 compression fracture. No acute fracture or suspicious osseous lesion. Multiple Schmorl's nodes. Paraspinal and other soft tissues: Abdominal aortic atherosclerosis without aneurysm. Colonic diverticulosis. Disc levels: Mild disc space narrowing at L1-2 and L3-4 and mild-to-moderate narrowing at L4-5 and L5-S1. T12-L1 mild disc bulging without evidence of significant stenosis. L1-2: Minimal disc bulging and mild facet and ligamentum flavum hypertrophy without evidence of significant stenosis. L2-3: Disc bulging and mild facet hypertrophy result in mild spinal stenosis and bilateral lateral recess stenosis, similar to the prior MRI. No significant neural foraminal stenosis. L3-4: Disc bulging and mild-to-moderate facet hypertrophy result in mild spinal stenosis, bilateral lateral recess stenosis, and mild bilateral neural foraminal stenosis, stable to mildly progressed from the prior MRI. L4-5: Disc bulging and mild-to-moderate facet hypertrophy result in mild spinal stenosis, bilateral lateral recess stenosis, and mild-to-moderate bilateral neural foraminal stenosis, stable to slightly progressed from the prior MRI. L5-S1: Disc bulging, endplate spurring, and moderate right and mild left  facet hypertrophy result in severe right and mild left neural foraminal stenosis without significant spinal stenosis, similar to the prior MRI. IMPRESSION: 1. No acute osseous abnormality. 2. 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 3. Severe right neural foraminal stenosis at L5-S1. 4.  Aortic Atherosclerosis (ICD10-I70.0). Electronically Signed   By: Logan Bores M.D.   On: 12/19/2022 17:36   MR BRAIN WO CONTRAST  Result Date: 12/19/2022 CLINICAL DATA:  Provided history: Neuro deficit, acute, stroke suspected. EXAM: MRI HEAD WITHOUT CONTRAST TECHNIQUE: Multiplanar, multiecho pulse sequences of the brain and surrounding structures were obtained without intravenous contrast. COMPARISON:  Head CT 12/19/2022. Brain MRI 09/24/2022. FINDINGS: Brain: No age advanced or lobar predominant parenchymal atrophy. 9 mm acute cortical infarct within the left parietal lobe (series 2, images 28 and 29) (series 3, images 8 and 9). Multifocal T2 FLAIR hyperintense signal abnormality within the cerebral white matter, nonspecific but compatible with mild chronic small vessel ischemic disease. Chronic lacunar infarct within the left thalamus. Prominent perivascular space within the right basal ganglia. There is no acute infarct. No evidence of an intracranial mass. No chronic intracranial blood products. No extra-axial fluid collection. No midline shift. Vascular: Maintained flow voids within the proximal large arterial vessels. Skull and upper cervical spine: No focal suspicious marrow lesion. Sinuses/Orbits: No mass or acute finding within the imaged orbits. Minimal mucosal thickening within the left frontal sinus. Opacification of the bilateral ethmoid air cells due to the presence of mucosal thickening and fluid, overall moderate-to-severe. Complete opacification of the right sphenoid sinus. Moderate partial opacification of the left sphenoid  sinus to the presence of frothy secretions, a fluid level and mucosal thickening. Other: Small-volume fluid within the bilateral mastoid air cells. IMPRESSION: 1. 9 mm acute cortical infarct within the left parietal lobe. 2. Otherwise stable non-contrast MRI appearance of the brain as compared to 09/24/2022. 3. Mild chronic small vessel ischemic changes within the cerebral white matter. 4. Chronic lacunar infarct within the left thalamus. 5. Paranasal sinus disease, as outlined. 6. Small-volume fluid within the bilateral mastoid air cells. Electronically Signed   By: Kellie Simmering D.O.   On: 12/19/2022 16:54  CT HEAD WO CONTRAST  Result Date: 12/19/2022 CLINICAL DATA:  Neuro deficit, acute, stroke suspected. Right-sided weakness, numbness, and right-sided facial droop beginning 3 days ago. Recent falls. EXAM: CT HEAD WITHOUT CONTRAST TECHNIQUE: Contiguous axial images were obtained from the base of the skull through the vertex without intravenous contrast. RADIATION DOSE REDUCTION: This exam was performed according to the departmental dose-optimization program which includes automated exposure control, adjustment of the mA and/or kV according to patient size and/or use of iterative reconstruction technique. COMPARISON:  Head CT and MRI 09/24/2022 FINDINGS: Brain: There is no evidence of an acute infarct, intracranial hemorrhage, mass, midline shift, or extra-axial fluid collection. A chronic lacunar infarct is again noted in the left thalamus. The ventricles and sulci are normal. Vascular: Calcified atherosclerosis at the skull base. No hyperdense vessel. Skull: No acute fracture or suspicious osseous lesion. Sinuses/Orbits: Increased, now complete opacification of the right sphenoid sinus. Small to moderate volume fluid in the left sphenoid sinus, slightly decreased from prior. Moderately extensive ethmoid air cell mucosal thickening bilaterally, similar to the prior CT. Clear mastoid air cells. Unremarkable  orbits. Other: None. IMPRESSION: 1. No evidence of acute intracranial abnormality. 2. Chronic left thalamic infarct. 3. Persistent/recurrent sinusitis. Electronically Signed   By: Sebastian Ache M.D.   On: 12/19/2022 15:14    Pending Labs Unresulted Labs (From admission, onward)     Start     Ordered   12/19/22 1835  Lipid panel  Once,   URGENT        12/19/22 1834   12/19/22 1834  Hemoglobin A1c  Once,   URGENT        12/19/22 1834            Vitals/Pain Today's Vitals   12/19/22 2029 12/19/22 2030 12/19/22 2045 12/19/22 2100  BP: (!) 160/94 (!) 180/76 (!) 199/103 (!) 177/67  Pulse: 69 68 65 63  Resp: 16 20 14 10   Temp:      TempSrc:      SpO2: 100% 100% 97% 100%  PainSc:        Isolation Precautions No active isolations  Medications Medications  sodium chloride flush (NS) 0.9 % injection 3 mL (3 mLs Intravenous Not Given 12/19/22 1510)  oxyCODONE (Oxy IR/ROXICODONE) immediate release tablet 5 mg (5 mg Oral Given 12/19/22 1743)  aspirin EC tablet 81 mg (81 mg Oral Given 12/19/22 1902)  clopidogrel (PLAVIX) tablet 75 mg (75 mg Oral Given 12/19/22 1902)  gabapentin (NEURONTIN) capsule 100 mg (100 mg Oral Given 12/19/22 2005)  atorvastatin (LIPITOR) tablet 80 mg (has no administration in time range)  pantoprazole (PROTONIX) EC tablet 40 mg (has no administration in time range)   stroke: early stages of recovery book (has no administration in time range)  acetaminophen (TYLENOL) tablet 650 mg (has no administration in time range)    Or  acetaminophen (TYLENOL) 160 MG/5ML solution 650 mg (has no administration in time range)    Or  acetaminophen (TYLENOL) suppository 650 mg (has no administration in time range)  senna-docusate (Senokot-S) tablet 1 tablet (has no administration in time range)  enoxaparin (LOVENOX) injection 40 mg (has no administration in time range)  insulin aspart (novoLOG) injection 0-20 Units (has no administration in time range)  insulin aspart (novoLOG)  injection 0-5 Units (has no administration in time range)  insulin glargine-yfgn (SEMGLEE) injection 15 Units (15 Units Subcutaneous Given 12/19/22 2005)  lisinopril (ZESTRIL) tablet 20 mg (20 mg Oral Not Given 12/19/22 1922)  amLODipine (NORVASC) tablet  10 mg (10 mg Oral Given 12/19/22 1930)  hydrALAZINE (APRESOLINE) injection 5 mg (has no administration in time range)  nicotine (NICODERM CQ - dosed in mg/24 hours) patch 14 mg (has no administration in time range)  methocarbamol (ROBAXIN) tablet 500 mg (500 mg Oral Given 12/19/22 2048)  HYDROmorphone (DILAUDID) injection 0.5 mg (0.5 mg Intravenous Given 12/19/22 2048)  fentaNYL (SUBLIMAZE) injection 50 mcg (50 mcg Intravenous Given 12/19/22 1540)  lactated ringers bolus 1,000 mL (0 mLs Intravenous Stopped 12/19/22 1937)  lisinopril (ZESTRIL) tablet 20 mg (20 mg Oral Given 12/19/22 1841)  fentaNYL (SUBLIMAZE) injection 50 mcg (50 mcg Intravenous Given 12/19/22 1900)  ketorolac (TORADOL) 30 MG/ML injection 30 mg (30 mg Intravenous Given 12/19/22 1931)  hydrALAZINE (APRESOLINE) injection 10 mg (10 mg Intravenous Given 12/19/22 1930)  iohexol (OMNIPAQUE) 350 MG/ML injection 75 mL (75 mLs Intravenous Contrast Given 12/19/22 1926)    Mobility walks     Focused Assessments Neuro Assessment Handoff:  Swallow screen pass? Yes    NIH Stroke Scale  Level of Consciousness (1a.)   : Alert, keenly responsive LOC Questions (1b. )   : Answers both questions correctly LOC Commands (1c. )   : Performs both tasks correctly Best Gaze (2. )  : Normal Visual (3. )  : No visual loss Facial Palsy (4. )    : Partial paralysis  Motor Arm, Left (5a. )   : No drift Motor Arm, Right (5b. ) : No drift Motor Leg, Left (6a. )  : Drift Motor Leg, Right (6b. ) : No drift Limb Ataxia (7. ): Present in one limb Sensory (8. )  : Mild-to-moderate sensory loss, patient feels pinprick is less sharp or is dull on the affected side, or there is a loss of superficial pain with  pinprick, but patient is aware of being touched Best Language (9. )  : No aphasia Dysarthria (10. ): Normal Extinction/Inattention (11.)   : No Abnormality Complete NIHSS TOTAL: 5     Neuro Assessment:   Neuro Checks:      Has TPA been given? No If patient is a Neuro Trauma and patient is going to OR before floor call report to 4N Charge nurse: [phone] or [phone]   R Recommendations: See Admitting Provider Note  Report given to:   Additional Notes:

## 2022-12-19 NOTE — H&P (Signed)
History and Physical    Theodore H[place] LKG:[id number] DOB: 01-29-1966 DOA: 12/19/2022  PCP: Esau Grew, MD (Confirm with patient/family/NH records and if not entered, this has to be entered at [facility] point of entry) Patient coming from: Home  I have personally briefly reviewed patient's old medical records in [facility]  Chief Complaint: Right sided weakness  HPI: Theodore Hinton is a 57 y.o. 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  Symptoms started 4 days ago with new onset of worsening of right-sided weakness including right arm and right leg, he attributed to his worsening of sciatica and did not seek medical advice.  In the last 3 days he fell several times denies any prodromes of syncope/near syncope, and no head injury no LOC.  He has been on aspirin since last stroke in July 2023.  Recently, he has had worsening of sciatica for which he was evaluated by neurosurgeon 1 week ago, who proposed lumbar spine injection however patient's sugar was found to be high and procedure consulted.  He was also diagnosed with " colon mass" by CT scan recently and was referred for colonoscopy which was also consulted to uncontrolled glucose level.  Last A1c was 10.3 in July last year  ED Course: Blood pressure significantly elevated, glucose 450.  Brain MRI showed no acute left parietal stroke.   Review of Systems: As per HPI otherwise 14 point review of systems negative.    Past Medical History:  Diagnosis Date   Back injury    Chronic back pain    Diabetes mellitus without complication (HCC)    Diverticulosis    Hypertension    Stroke [facility])     History reviewed. No pertinent surgical history.   reports that he has never smoked. His smokeless tobacco use  includes chew. He reports that he does not currently use alcohol. He reports that he does not use drugs.  No Known Allergies  History reviewed. No pertinent family history.  Prior to Admission medications   Medication Sig Start Date End Date Taking? Authorizing Provider  amLODipine (NORVASC) 5 MG tablet Take 1 tablet (5 mg total) by mouth daily. 01/06/22   Derwood Kaplan, MD  aspirin EC 81 MG tablet Take 1 tablet (81 mg total) by mouth daily. Swallow whole. 09/08/22   Micki Riley, MD  atorvastatin (LIPITOR) 80 MG tablet Take 1 tablet (80 mg total) by mouth daily. 06/21/22   Bailey-Modzik, Delila A, NP  glipiZIDE (GLUCOTROL) 5 MG tablet Take 1 tablet (5 mg total) by mouth daily before breakfast. 06/20/22 09/18/22  Bailey-Modzik, Delila A, NP  ondansetron (ZOFRAN) 4 MG tablet Take 1 tablet (4 mg total) by mouth every 8 (eight) hours as needed for nausea or vomiting. 11/01/22   Terrilee Files, MD  pantoprazole (PROTONIX) 40 MG tablet Take 1 tablet (40 mg total) by mouth daily. 11/01/22   Terrilee Files, MD    Physical Exam: Vitals:   12/19/22 1548 12/19/22 1749 12/19/22 1845 12/19/22 1900  BP:  (!) 195/94 (!) 187/76 (!) 236/130  Pulse: (!) 56  (!) 45 96  Resp: (!) 22  19 16   Temp:  98.3 F (36.8 C)    TempSrc:      SpO2: 97%  100% 100%    Constitutional: NAD, calm,  comfortable Vitals:   12/19/22 1548 12/19/22 1749 12/19/22 1845 12/19/22 1900  BP:  (!) 195/94 (!) 187/76 (!) 236/130  Pulse: (!) 56  (!) 45 96  Resp: (!) 22  19 16   Temp:  98.3 F (36.8 C)    TempSrc:      SpO2: 97%  100% 100%   Eyes: PERRL, lids and conjunctivae normal ENMT: Mucous membranes are moist. Posterior pharynx clear of any exudate or lesions.Normal dentition.  Neck: normal, supple, no masses, no thyromegaly Respiratory: clear to auscultation bilaterally, no wheezing, no crackles. Normal respiratory effort. No accessory muscle use.  Cardiovascular: Regular rate and rhythm, no murmurs / rubs /  gallops. No extremity edema. 2+ pedal pulses. No carotid bruits.  Abdomen: no tenderness, no masses palpated. No hepatosplenomegaly. Bowel sounds positive.  Musculoskeletal: no clubbing / cyanosis. No joint deformity upper and lower extremities. Good ROM, no contractures. Normal muscle tone.  Severe shooting like right leg pain and positive leg raising to 30 degree which induce significant severe shooting pain down to right leg Skin: no rashes, lesions, ulcers. No induration Neurologic: Slight facial droop on left side, no tongue deviation.  Increased let us transition of right forearm and right hand compared to left side, decreased light touch sensation of right foot compared to left foot, DTR normal. Strength 4/5 in left arm and left hand gripping compared to 5/5 on the left side, right foot strength 4/5 compared to 5/5 on the left Psychiatric: Normal judgment and insight. Alert and oriented x 3. Normal mood.     Labs on Admission: I have personally reviewed following labs and imaging studies  CBC: Recent Labs  Lab 12/19/22 1339 12/19/22 1355  WBC 10.4  --   NEUTROABS 7.3  --   HGB 15.7 15.6  HCT 45.0 46.0  MCV 89.1  --   PLT 191  --    Basic Metabolic Panel: Recent Labs  Lab 12/19/22 1339 12/19/22 1355  NA 131* 133*  K 3.3* 3.6  CL 98 97*  CO2 22  --   GLUCOSE 451* 454*  BUN 20 21*  CREATININE 0.82 0.60*  CALCIUM 8.8*  --    GFR: CrCl cannot be calculated (Unknown ideal weight.). Liver Function Tests: Recent Labs  Lab 12/19/22 1339  AST 23  ALT 19  ALKPHOS 51  BILITOT 1.7*  PROT 5.9*  ALBUMIN 3.3*   No results for input(s): "LIPASE", "AMYLASE" in the last 168 hours. No results for input(s): "AMMONIA" in the last 168 hours. Coagulation Profile: Recent Labs  Lab 12/19/22 1339  INR 1.0   Cardiac Enzymes: No results for input(s): "CKTOTAL", "CKMB", "CKMBINDEX", "TROPONINI" in the last 168 hours. BNP (last 3 results) No results for input(s): "PROBNP" in the  last 8760 hours. HbA1C: No results for input(s): "HGBA1C" in the last 72 hours. CBG: Recent Labs  Lab 12/19/22 1402  GLUCAP 388*   Lipid Profile: No results for input(s): "CHOL", "HDL", "LDLCALC", "TRIG", "CHOLHDL", "LDLDIRECT" in the last 72 hours. Thyroid Function Tests: No results for input(s): "TSH", "T4TOTAL", "FREET4", "T3FREE", "THYROIDAB" in the last 72 hours. Anemia Panel: No results for input(s): "VITAMINB12", "FOLATE", "FERRITIN", "TIBC", "IRON", "RETICCTPCT" in the last 72 hours. Urine analysis:    Component Value Date/Time   COLORURINE YELLOW 11/01/2022 1531   APPEARANCEUR CLEAR 11/01/2022 1531   LABSPEC 1.015 11/01/2022 1531   PHURINE 7.0 11/01/2022 1531   GLUCOSEU >=500 (A) 11/01/2022 1531   HGBUR NEGATIVE 11/01/2022 [place] 11/01/2022 1531  KETONESUR NEGATIVE 11/01/2022 1531   PROTEINUR NEGATIVE 11/01/2022 1531   UROBILINOGEN 1.0 06/26/2011 2113   NITRITE NEGATIVE 11/01/2022 1531   LEUKOCYTESUR NEGATIVE 11/01/2022 1531    Radiological Exams on Admission: CT Lumbar Spine Wo Contrast  Result Date: 12/19/2022 CLINICAL DATA:  Back trauma, no prior imaging (Age >= 16y). Low back pain, worse after a fall. EXAM: CT LUMBAR SPINE WITHOUT CONTRAST TECHNIQUE: Multidetector CT imaging of the lumbar spine was performed without intravenous contrast administration. Multiplanar CT image reconstructions were also generated. RADIATION DOSE REDUCTION: This exam was performed according to the departmental dose-optimization program which includes automated exposure control, adjustment of the mA and/or kV according to patient size and/or use of iterative reconstruction technique. COMPARISON:  CT abdomen and pelvis 11/01/2022. MRI lumbar spine 07/19/2016. FINDINGS: Segmentation: 5 lumbar type vertebrae. Alignment: Chronic trace retrolisthesis of L2 on L3 and L3 on L4. Vertebrae: Unchanged mild chronic L1 compression fracture. No acute fracture or suspicious osseous  lesion. Multiple Schmorl's nodes. Paraspinal and other soft tissues: Abdominal aortic atherosclerosis without aneurysm. Colonic diverticulosis. Disc levels: Mild disc space narrowing at L1-2 and L3-4 and mild-to-moderate narrowing at L4-5 and L5-S1. T12-L1 mild disc bulging without evidence of significant stenosis. L1-2: Minimal disc bulging and mild facet and ligamentum flavum hypertrophy without evidence of significant stenosis. L2-3: Disc bulging and mild facet hypertrophy result in mild spinal stenosis and bilateral lateral recess stenosis, similar to the prior MRI. No significant neural foraminal stenosis. L3-4: Disc bulging and mild-to-moderate facet hypertrophy result in mild spinal stenosis, bilateral lateral recess stenosis, and mild bilateral neural foraminal stenosis, stable to mildly progressed from the prior MRI. L4-5: Disc bulging and mild-to-moderate facet hypertrophy result in mild spinal stenosis, bilateral lateral recess stenosis, and mild-to-moderate bilateral neural foraminal stenosis, stable to slightly progressed from the prior MRI. L5-S1: Disc bulging, endplate spurring, and moderate right and mild left facet hypertrophy result in severe right and mild left neural foraminal stenosis without significant spinal stenosis, similar to the prior MRI. IMPRESSION: 1. No acute osseous abnormality. 2. 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 3. Severe right neural foraminal stenosis at L5-S1. 4.  Aortic Atherosclerosis (ICD10-I70.0). Electronically Signed   By: Sebastian Ache M.D.   On: 12/19/2022 17:36   MR BRAIN WO CONTRAST  Result Date: 12/19/2022 CLINICAL DATA:  Provided history: Neuro deficit, acute, stroke suspected. EXAM: MRI HEAD WITHOUT CONTRAST TECHNIQUE: Multiplanar, multiecho pulse sequences of the brain and surrounding structures were obtained without intravenous contrast. COMPARISON:  Head CT 12/19/2022.  Brain MRI 09/24/2022. FINDINGS: Brain: No age advanced or lobar predominant parenchymal atrophy. 9 mm acute cortical infarct within the left parietal lobe (series 2, images 28 and 29) (series 3, images 8 and 9). Multifocal T2 FLAIR hyperintense signal abnormality within the cerebral white matter, nonspecific but compatible with mild chronic small vessel ischemic disease. Chronic lacunar infarct within the left thalamus. Prominent perivascular space within the right basal ganglia. There is no acute infarct. No evidence of an intracranial mass. No chronic intracranial blood products. No extra-axial fluid collection. No midline shift. Vascular: Maintained flow voids within the proximal large arterial vessels. Skull and upper cervical spine: No focal suspicious marrow lesion. Sinuses/Orbits: No mass or acute finding within the imaged orbits. Minimal mucosal thickening within the left frontal sinus. Opacification of the bilateral ethmoid air cells due to the presence of mucosal thickening and fluid, overall moderate-to-severe. Complete opacification of the right sphenoid sinus.  Moderate partial opacification of the left sphenoid sinus to the presence of frothy secretions, a fluid level and mucosal thickening. Other: Small-volume fluid within the bilateral mastoid air cells. IMPRESSION: 1. 9 mm acute cortical infarct within the left parietal lobe. 2. Otherwise stable non-contrast MRI appearance of the brain as compared to 09/24/2022. 3. Mild chronic small vessel ischemic changes within the cerebral white matter. 4. Chronic lacunar infarct within the left thalamus. 5. Paranasal sinus disease, as outlined. 6. Small-volume fluid within the bilateral mastoid air cells. Electronically Signed   By: Kellie Simmering D.O.   On: 12/19/2022 16:54   CT HEAD WO CONTRAST  Result Date: 12/19/2022 CLINICAL DATA:  Neuro deficit, acute, stroke suspected. Right-sided weakness, numbness, and right-sided facial droop beginning 3 days ago.  Recent falls. EXAM: CT HEAD WITHOUT CONTRAST TECHNIQUE: Contiguous axial images were obtained from the base of the skull through the vertex without intravenous contrast. RADIATION DOSE REDUCTION: This exam was performed according to the departmental dose-optimization program which includes automated exposure control, adjustment of the mA and/or kV according to patient size and/or use of iterative reconstruction technique. COMPARISON:  Head CT and MRI 09/24/2022 FINDINGS: Brain: There is no evidence of an acute infarct, intracranial hemorrhage, mass, midline shift, or extra-axial fluid collection. A chronic lacunar infarct is again noted in the left thalamus. The ventricles and sulci are normal. Vascular: Calcified atherosclerosis at the skull base. No hyperdense vessel. Skull: No acute fracture or suspicious osseous lesion. Sinuses/Orbits: Increased, now complete opacification of the right sphenoid sinus. Small to moderate volume fluid in the left sphenoid sinus, slightly decreased from prior. Moderately extensive ethmoid air cell mucosal thickening bilaterally, similar to the prior CT. Clear mastoid air cells. Unremarkable orbits. Other: None. IMPRESSION: 1. No evidence of acute intracranial abnormality. 2. Chronic left thalamic infarct. 3. Persistent/recurrent sinusitis. Electronically Signed   By: Logan Bores M.D.   On: 12/19/2022 15:14    EKG: Pending  Assessment/Plan Principal Problem:   Stroke [facility]) Active Problems:   Stroke (cerebrum) ([place])   Controlled type 2 diabetes mellitus without complication, without long-term current use of insulin (HCC)   Sciatica   HTN (hypertension)   Colonic mass  (please populate well all problems here in Problem List. (For example, if patient is on BP meds at home and you resume or decide to hold them, it is a problem that needs to be her. Same for CAD, COPD, HLD and so on)  Note left parietal stroke, thrombotic -With worsening of right upper extremity  paresthesias and paresthesia -CTA pending -Aspirin plus Plavix regimen as per neurology -Echocardiogram -Out of 3 days permissive hypertension window, resume home BP meds, increase amlodipine to 10 mg daily, add lisinopril 20 mg daily -Check A1c and lipid study -PT evaluation, expect patient will need rehab time  Acute on chronic sciatica with worsening of right leg pain and weakness -Secondary to severe multilevel spinal stenosis from L3-L5-S1 identified on recent lumbar MRI and today's lumbar spine CT. -Was offered lumbar spinal injection last week by neurosurgery however canceled due to uncontrolled diabetes. -Continue chronic narcotic therapy, add gabapentin TID -Multiple dose of fentanyl given in the ED, will try to avoid IV narcotics.  1 dose of Toradol ordered.  HTN uncontrolled -Increase amlodipine to 10 mg daily, add lisinopril 20 mg daily and as needed hydralazine  IIDM -With hyperglycemia -With most recent A1c 10, likely will need insulin on discharge.  Start patient on Lantus 50 mg daily and sliding scale -Hold off  metformin for 3 days as patient is to receive IV contrast for CTA  Hyponatremia, pseudo -likely secondary to hyperglycemia  Hypokalemia -Resolved  Colon mass -Colonoscopy was canceled due to uncontrolled diabetes.  Outpatient follow-up with GI for elective colonoscopy  Cigarette smoking -Cessation consultation performed at bedside -Nicotine patch for now    DVT prophylaxis: Lovenox Code Status: Full code Family Communication: Son side Disposition Plan: Patient is sick with significant neurodeficit after stroke, requiring inpatient stroke workup and inpatient PT evaluation, expect patient discharged to rehab Consults called: Neurology Admission status: Telemetry admission   Emeline General MD [facility] Pager [phone]  12/19/2022, 7:07 PM

## 2022-12-19 NOTE — Consult Note (Signed)
Neurology Consultation  Reason for Consult: MRI with subacute cortical infarct Referring Physician: Dr. Oswald Hillock  CC: Sided weakness, sensory deficits  History is obtained from: Patient, patient's family at bedside  HPI: Theodore Hinton is a 57 y.o. 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  Patient was in his usual state of health on Thursday night when he went to bed 12/15/2022.  On Friday morning he woke up with the symptoms present on hospital arrival.  Patient states that he fell 3 times on Saturday while at work due to right lower extremity weakness and complains of significant sciatic pain today.  Of note, patient states that he saw a physician approximately 3 months ago that advised him to stop taking aspirin as it would not prevent strokes and patient stopped aspirin at this time.  ROS: A complete ROS was performed and is negative except as noted in the HPI.   Past Medical History:  Diagnosis Date   Back injury    Chronic back pain    Diabetes mellitus without complication ([place])    Diverticulosis    Hypertension    Stroke [facility])    History reviewed. No pertinent surgical history.'  History reviewed. No pertinent family history.  Social History:   reports that he has never smoked. His smokeless tobacco use includes chew. He reports that he does not currently use alcohol. He reports that he does not use drugs.  Medications  Current Facility-Administered Medications:    acetaminophen (TYLENOL) tablet 650 mg, 650 mg, Oral, Q6H PRN, Tretha Sciara, MD, 650 mg at 12/19/22 1743   oxyCODONE (Oxy IR/ROXICODONE) immediate release  tablet 5 mg, 5 mg, Oral, Q3H PRN, Tretha Sciara, MD, 5 mg at 12/19/22 1743   sodium chloride flush (NS) 0.9 % injection 3 mL, 3 mL, Intravenous, Once, Prosperi, Christian H, PA-C  Current Outpatient Medications:    amLODipine (NORVASC) 5 MG tablet, Take 1 tablet (5 mg total) by mouth daily., Disp: 30 tablet, Rfl: 1   aspirin EC 81 MG tablet, Take 1 tablet (81 mg total) by mouth daily. Swallow whole., Disp: 30 tablet, Rfl: 12   atorvastatin (LIPITOR) 80 MG tablet, Take 1 tablet (80 mg total) by mouth daily., Disp: 30 tablet, Rfl: 2   glipiZIDE (GLUCOTROL) 5 MG tablet, Take 1 tablet (5 mg total) by mouth daily before breakfast., Disp: 30 tablet, Rfl: 2   ondansetron (ZOFRAN) 4 MG tablet, Take 1 tablet (4 mg total) by mouth every 8 (eight) hours as needed for nausea or vomiting., Disp: 15 tablet, Rfl: 0   pantoprazole (PROTONIX) 40 MG tablet, Take 1 tablet (40 mg total) by mouth daily., Disp: 30 tablet, Rfl: 0  Exam: Current vital signs: BP (!) 195/94 (BP Location: Right Arm)   Pulse (!) 56   Temp 98.3 F (36.8 C) (Oral)   Resp (!) 22   SpO2 97%  Vital signs in last 24 hours: Temp:  [98.3 F (36.8 C)] 98.3 F (36.8 C) (01/15 1331) Pulse Rate:  [56-64] 56 (01/15 1548) Resp:  [18-23] 22 (01/15 1548) BP: (123-195)/(83-94) 195/94 (01/15 1749) SpO2:  [97 %-100 %] 97 % (01/15 1548)  GENERAL: Awake, alert, appears uncomfortable laying in ER  stretcher due to lower back pain Psych: Affect appropriate for situation, patient is calm and cooperative with examination Head: Normocephalic and atraumatic, without obvious abnormality EENT: Normal conjunctivae, dry mucous membranes, no OP obstruction LUNGS: Normal respiratory effort. Non-labored breathing on room air CV: Sinus bradycardia with rate in the 40s on telemetry ABDOMEN: Soft, non-tender, non-distended Extremities: Warm, well perfused, without obvious deformity  NEURO:  Mental Status: Awake, alert, and oriented to person, place, time,  and situation. He is able to provide a clear and coherent history of present illness. Speech/Language: speech is intact without dysarthria. Naming, repetition, fluency, and comprehension intact without aphasia  No neglect is noted Cranial Nerves:  II: PERRL. visual fields full.  III, IV, VI: EOMI without gaze preference, nystagmus V: Decreased sensation to light touch on the right face VII: Right facial asymmetry noted VIII: Hearing intact to voice IX, X: Palate elevation is symmetric. Phonation normal.  XI: Normal sternocleidomastoid and trapezius muscle strength XII: Tongue protrudes midline without fasciculations.   Motor: Left upper extremity elevates antigravity without vertical drift.  Right upper extremity has minimal vertical drift. Bilateral lower extremity strength assessment is pain limited.  Best exam reveals right lower extremity with vertical drift.  Bilateral lower extremities with minimal elevation due to lower back pain. Tone is normal. Bulk is normal.  Sensation: Decrease sensation to light touch in the right upper and lower extremity. Coordination: No overt dysmetria. Gait: Deferred for patient safety with reports of multiple recent falls  NIHSS: 1a Level of Conscious.: 0 1b LOC Questions: 0 1c LOC Commands: 0 2 Best Gaze: 0 3 Visual: 0 4 Facial Palsy: 1 5a Motor Arm - left: 0 5b Motor Arm - Right: 1 6a Motor Leg - Left: 0 6b Motor Leg - Right: 1 7 Limb Ataxia: 0 8 Sensory: 1 9 Best Language: 0 10 Dysarthria: 0 11 Extinct. and Inatten.: 0 TOTAL: 4  Labs I have reviewed labs in epic and the results pertinent to this consultation are: CBC    Component Value Date/Time   WBC 10.4 12/19/2022 1339   RBC 5.05 12/19/2022 1339   HGB 15.6 12/19/2022 1355   HGB 15.3 02/28/2015 1552   HCT 46.0 12/19/2022 1355   HCT 45.3 02/28/2015 1552   PLT 191 12/19/2022 1339   PLT 208 02/28/2015 1552   MCV 89.1 12/19/2022 1339   MCV 89 02/28/2015 1552   MCH 31.1  12/19/2022 1339   MCHC 34.9 12/19/2022 1339   RDW 12.2 12/19/2022 1339   RDW 13.2 02/28/2015 1552   LYMPHSABS 2.3 12/19/2022 1339   MONOABS 0.5 12/19/2022 1339   EOSABS 0.2 12/19/2022 1339   BASOSABS 0.0 12/19/2022 1339   CMP     Component Value Date/Time   NA 133 (L) 12/19/2022 1355   NA 136 02/28/2015 1552   K 3.6 12/19/2022 1355   K 4.0 02/28/2015 1552   CL 97 (L) 12/19/2022 1355   CL 105 02/28/2015 1552   CO2 22 12/19/2022 1339   CO2 23 02/28/2015 1552   GLUCOSE 454 (H) 12/19/2022 1355   GLUCOSE 241 (H) 02/28/2015 1552   BUN 21 (H) 12/19/2022 1355   BUN 14 02/28/2015 1552   CREATININE 0.60 (L) 12/19/2022 1355   CREATININE 0.75 02/28/2015 1552   CALCIUM 8.8 (L) 12/19/2022 1339   CALCIUM 9.0 02/28/2015 1552   PROT 5.9 (L) 12/19/2022 1339   ALBUMIN 3.3 (L) 12/19/2022 1339   AST 23 12/19/2022 1339   ALT 19 12/19/2022 1339   ALKPHOS  51 12/19/2022 1339   BILITOT 1.7 (H) 12/19/2022 1339   GFRNONAA >60 12/19/2022 1339   GFRNONAA >60 02/28/2015 1552   GFRAA >60 02/04/2019 1401   GFRAA >60 02/28/2015 1552   Lipid Panel     Component Value Date/Time   CHOL 171 06/19/2022 0316   TRIG 127 06/19/2022 0316   HDL 25 (L) 06/19/2022 0316   CHOLHDL 6.8 06/19/2022 0316   VLDL 25 06/19/2022 0316   LDLCALC 121 (H) 06/19/2022 0316   Lab Results  Component Value Date   HGBA1C 10.3 (H) 06/19/2022   Imaging I have reviewed the images obtained:  CT-scan of the brain 12/19/2022: 1. No evidence of acute intracranial abnormality. 2. Chronic left thalamic infarct. 3. Persistent/recurrent sinusitis.  MRI examination of the brain 12/19/2022: 1. 9 mm acute cortical infarct within the left parietal lobe. 2. Otherwise stable non-contrast MRI appearance of the brain as compared to 09/24/2022. 3. Mild chronic small vessel ischemic changes within the cerebral white matter. 4. Chronic lacunar infarct within the left thalamus. 5. Paranasal sinus disease, as outlined. 6. Small-volume fluid  within the bilateral mastoid air cells.  Assessment: 57 year old male with PMHx of HTN, CVA in July 2023 with residual right hand, foot, mouth numbness, DM2, and chronic back pain who presents to the ED with 4 days of right mouth droop, right upper and lower extremity sensory disturbance, and right-sided weakness.  Patient endorses that his symptoms have caused him to have 3 falls in the past 2 days with resulting worsening of his chronic back pain.  Patient previously on aspirin for stroke prophylaxis at discharge in July 2023, however reports that he was told by another provider to not take aspirin and quit this medication approximately 3 months ago.  Impression: Subacute cortical left parietal infarct on MRI Chronic left thalamic lacunar infarct  Recommendations: - HgbA1c, lipid panel - CTA head and neck - Blood pressure goal: normotension as symptoms started 4 days PTA - Stroke prophylaxis: ASA 81 mg PO daily in addition to clopidogrel 75 mg PO daily for 21 days followed by ASA monotherapy (patient counseled on importance of daily ASA)  - Blood glucose management per primary team  - Frequent neuro checks - Echocardiogram - Risk factor modification - Telemetry monitoring - PT consult, OT consult, Speech consult - Stroke team to follow  Pt seen by NP/Neuro and later by MD. Note/plan to be edited by MD as needed.  Lanae Boast, AGAC-NP [facility] Pager: [phone]   Attending Neurohospitalist Addendum Patient seen and examined with APP/Resident. Agree with the history and physical as documented above. Agree with the plan as documented, which I helped formulate. I have edited the note above to reflect my full findings and recommendations. I have independently reviewed the chart, obtained history, review of systems and examined the patient.I have personally reviewed pertinent head/neck/spine imaging (CT/MRI). Please feel free to call with any  questions.  -- Bing Neighbors, MD [facility] [phone]  If 7pm- 7am, please page neurology on call as listed in AMION.

## 2022-12-19 NOTE — ED Notes (Signed)
Patient bp's noted to be over 200s PRN medication administered.

## 2022-12-19 NOTE — ED Triage Notes (Signed)
Pt c/o R sided weakness, falling twice Friday, R sided facial droop, numbness of R face; pt states symptoms began Friday; hx stroke 9 months ago, states he always has some numbness of right face but has increased last couple of days; pt endorses compliance with blood thinners; denies hitting head; also endorses low back pain, hx chronic back pain, worse after falling

## 2022-12-19 NOTE — ED Notes (Signed)
MD notified of patient severe back pain radiating to right leg, rating pain level over 10. Also notified of patient blood pressures over 200s.

## 2022-12-19 NOTE — ED Provider Triage Note (Signed)
Emergency Medicine Provider Triage Evaluation Note  Theodore Hinton , a 57 y.o. male  was evaluated in triage.  Pt complains of weakness, numbness, strokelike symptoms since Friday.  Patient with 2 falls, he has some noted right-sided deficits, facial droop, weakness, he had some deficits at baseline from previous stroke but reports significantly worse than normal.  He does endorse some double vision this morning which is since overall resolved.  He is still taking a blood thinner per his report, has not missed any doses..  Review of Systems  Positive: Right sided weakness, facial droop, falls Negative: Fever, chills, chest pain, shob  Physical Exam  BP 123/83   Pulse 64   Temp 98.3 F (36.8 C) (Oral)   Resp 18   SpO2 98%  Gen:   Awake, no distress   Resp:  Normal effort  MSK:   Moves extremities without difficulty  Other:  Cranial nerves II through XII grossly intact other than right sided facial droop, decreased sensation on right.  intact heel-to-shin. Alert and oriented x3.  Moves all 4 limbs spontaneously, normal coordination, questionable dysmetria on right compared to left for finger nose finger.  No pronator drift. Decreased strength of right arm, leg compared to left   Medical Decision Making  Medically screening exam initiated at 1:38 PM.  Appropriate orders placed.  Theodore Hinton was informed that the remainder of the evaluation will be completed by another provider, this initial triage assessment does not replace that evaluation, and the importance of remaining in the ED until their evaluation is complete.  Workup initiated in triage    Anselmo Pickler, [place] 12/19/22 1338

## 2022-12-19 NOTE — ED Provider Notes (Signed)
Clinical Course as of 12/19/22 1851  Mon Dec 19, 2022  1612 Stable 63 YOM with concern for right sided weakness. Stroke consulted. CT and MRI ordered. Fall on saturday LKW 3 days ago.   [CC]    Clinical Course User Index [CC] Tretha Sciara, MD   Disposition:   Based on the above findings, I believe this patient is stable for admission.    Patient/family educated about specific findings on our evaluation and explained exact reasons for admission.  Patient/family educated about clinical situation and time was allowed to answer questions.   Admission team communicated with and agreed with need for admission. Patient admitted. Patient ready to move at this time.     Emergency Department Medication Summary:   Medications  sodium chloride flush (NS) 0.9 % injection 3 mL (3 mLs Intravenous Not Given 12/19/22 1510)  oxyCODONE (Oxy IR/ROXICODONE) immediate release tablet 5 mg (5 mg Oral Given 12/19/22 1743)  acetaminophen (TYLENOL) tablet 650 mg (650 mg Oral Given 12/19/22 1743)  aspirin EC tablet 81 mg (has no administration in time range)  clopidogrel (PLAVIX) tablet 75 mg (has no administration in time range)  fentaNYL (SUBLIMAZE) injection 50 mcg (has no administration in time range)  fentaNYL (SUBLIMAZE) injection 50 mcg (50 mcg Intravenous Given 12/19/22 1540)  lactated ringers bolus 1,000 mL (1,000 mLs Intravenous New Bag/Given 12/19/22 1744)  lisinopril (ZESTRIL) tablet 20 mg (20 mg Oral Given 12/19/22 1841)          Tretha Sciara, MD 12/19/22 1851

## 2022-12-19 NOTE — ED Provider Notes (Signed)
[facility] EMERGENCY DEPARTMENT Provider Note   CSN: [id number] Arrival date & time: 12/19/22  1319     History  No chief complaint on file.   Theodore Hinton is a 57 y.o. male.  HPI   57 year old male with medical history significant for prior CVA, HTN, chronic low back pain, diverticulosis, EM 2, presenting to the emergency department with concern for acute stroke.  The patient states that his last known normal was Thursday when he went to bed.  He woke up on Friday morning with a right-sided facial droop, worsening numbness in the right side of his face, weakness and numbness in the right hemibody.  He states that his residual deficits from his old stroke were paresthesias around the right side of his mouth as well as some occasional paresthesias in his right hemibody.  He did not seek care immediately on Friday and presents to the emergency department today for further evaluation.  He did have a single fall on Saturday, denies any head trauma or loss of consciousness.  He states that he is not on anticoagulation.  He states that he did not strike his neck.  He does endorse acute on chronic low back pain since the fall.  He denies any infectious symptoms.  He arrived to the emerged part GCS 15, ABC intact.  Home Medications Prior to Admission medications   Medication Sig Start Date End Date Taking? Authorizing Provider  amLODipine (NORVASC) 5 MG tablet Take 1 tablet (5 mg total) by mouth daily. 01/06/22   Derwood Kaplan, MD  aspirin EC 81 MG tablet Take 1 tablet (81 mg total) by mouth daily. Swallow whole. 09/08/22   Micki Riley, MD  atorvastatin (LIPITOR) 80 MG tablet Take 1 tablet (80 mg total) by mouth daily. 06/21/22   Bailey-Modzik, Delila A, NP  glipiZIDE (GLUCOTROL) 5 MG tablet Take 1 tablet (5 mg total) by mouth daily before breakfast. 06/20/22 09/18/22  Bailey-Modzik, Delila A, NP  ondansetron (ZOFRAN) 4 MG tablet Take 1 tablet (4 mg total) by mouth every 8  (eight) hours as needed for nausea or vomiting. 11/01/22   Terrilee Files, MD  pantoprazole (PROTONIX) 40 MG tablet Take 1 tablet (40 mg total) by mouth daily. 11/01/22   Terrilee Files, MD      Allergies    Patient has no known allergies.    Review of Systems   Review of Systems  All other systems reviewed and are negative.   Physical Exam Updated Vital Signs BP 123/83   Pulse 64   Temp 98.3 F (36.8 C) (Oral)   Resp 18   SpO2 98%  Physical Exam Vitals and nursing note reviewed.  Constitutional:      Appearance: He is well-developed.     Comments: GCS 15, ABC intact  HENT:     Head: Normocephalic.  Eyes:     Conjunctiva/sclera: Conjunctivae normal.  Neck:     Comments: No midline tenderness to palpation of the cervical spine. ROM intact. Cardiovascular:     Rate and Rhythm: Normal rate and regular rhythm.     Heart sounds: No murmur heard. Pulmonary:     Effort: Pulmonary effort is normal. No respiratory distress.     Breath sounds: Normal breath sounds.  Chest:     Comments: Chest wall stable and non-tender to AP and lateral compression. Clavicles stable and non-tender to AP compression Abdominal:     Palpations: Abdomen is soft.     Tenderness: There  is no abdominal tenderness.     Comments: Pelvis stable to lateral compression.  Musculoskeletal:     Cervical back: Neck supple.     Comments: No midline tenderness to palpation of the thoracic spine.  Midline tenderness to palpation of the lumbar spine.  Extremities atraumatic with intact ROM.   Skin:    General: Skin is warm and dry.  Neurological:     Mental Status: He is alert.     Comments: MENTAL STATUS EXAM:    Orientation: Alert and oriented to person, place and time.  Memory: Cooperative, follows commands well.  Language: Speech is clear and language is normal.   CRANIAL NERVES:    CN 2 (Optic): Visual fields intact to confrontation.  CN 3,4,6 (EOM): Pupils equal and reactive to light. Full  extraocular eye movement without nystagmus.  CN 5 (Trigeminal): Facial sensation is DIMINISHED ON THE RIGHT, no weakness of masticatory muscles.  CN 7 (Facial): SUBTLE RIGHT FACIAL DROOP CN 8 (Auditory): Auditory acuity grossly normal.  CN 9,10 (Glossophar): The uvula is midline, the palate elevates symmetrically.  CN 11 (spinal access): Normal sternocleidomastoid and trapezius strength.  CN 12 (Hypoglossal): The tongue is midline. No atrophy or fasciculations.Marland Kitchen   MOTOR:  Muscle Strength: 4/5RUE, 5/5LUE, 4/5RLE, 4/5LLE.   COORDINATION:   Intact finger-to-nose, no tremor.   SENSATION:   Intact to light touch all four extremities, DIMINISHED IN THE RIGHT HEMIBODY.       ED Results / Procedures / Treatments   Labs (all labs ordered are listed, but only abnormal results are displayed) Labs Reviewed  APTT - Abnormal; Notable for the following components:      Result Value   aPTT 23 (*)    All other components within normal limits  COMPREHENSIVE METABOLIC PANEL - Abnormal; Notable for the following components:   Sodium 131 (*)    Potassium 3.3 (*)    Glucose, Bld 451 (*)    Calcium 8.8 (*)    Total Protein 5.9 (*)    Albumin 3.3 (*)    Total Bilirubin 1.7 (*)    All other components within normal limits  I-STAT CHEM 8, ED - Abnormal; Notable for the following components:   Sodium 133 (*)    Chloride 97 (*)    BUN 21 (*)    Creatinine, Ser 0.60 (*)    Glucose, Bld 454 (*)    Calcium, Ion 1.01 (*)    All other components within normal limits  CBG MONITORING, ED - Abnormal; Notable for the following components:   Glucose-Capillary 388 (*)    All other components within normal limits  PROTIME-INR  CBC  DIFFERENTIAL  ETHANOL    EKG None  Radiology CT HEAD WO CONTRAST  Result Date: 12/19/2022 CLINICAL DATA:  Neuro deficit, acute, stroke suspected. Right-sided weakness, numbness, and right-sided facial droop beginning 3 days ago. Recent falls. EXAM: CT HEAD WITHOUT  CONTRAST TECHNIQUE: Contiguous axial images were obtained from the base of the skull through the vertex without intravenous contrast. RADIATION DOSE REDUCTION: This exam was performed according to the departmental dose-optimization program which includes automated exposure control, adjustment of the mA and/or kV according to patient size and/or use of iterative reconstruction technique. COMPARISON:  Head CT and MRI 09/24/2022 FINDINGS: Brain: There is no evidence of an acute infarct, intracranial hemorrhage, mass, midline shift, or extra-axial fluid collection. A chronic lacunar infarct is again noted in the left thalamus. The ventricles and sulci are normal. Vascular: Calcified atherosclerosis at the  skull base. No hyperdense vessel. Skull: No acute fracture or suspicious osseous lesion. Sinuses/Orbits: Increased, now complete opacification of the right sphenoid sinus. Small to moderate volume fluid in the left sphenoid sinus, slightly decreased from prior. Moderately extensive ethmoid air cell mucosal thickening bilaterally, similar to the prior CT. Clear mastoid air cells. Unremarkable orbits. Other: None. IMPRESSION: 1. No evidence of acute intracranial abnormality. 2. Chronic left thalamic infarct. 3. Persistent/recurrent sinusitis. Electronically Signed   By: Logan Bores M.D.   On: 12/19/2022 15:14    Procedures Procedures    Medications Ordered in ED Medications  sodium chloride flush (NS) 0.9 % injection 3 mL (3 mLs Intravenous Not Given 12/19/22 1510)  lactated ringers bolus 1,000 mL (has no administration in time range)  fentaNYL (SUBLIMAZE) injection 50 mcg (50 mcg Intravenous Given 12/19/22 1540)    ED Course/ Medical Decision Making/ A&P Clinical Course as of 12/19/22 1648  Mon Dec 19, 2022  1612 Stable 43 YOM with concern for right sided weakness. Stroke consulted. CT and MRI ordered. Fall on saturday LKW 3 days ago.   [CC]    Clinical Course User Index [CC] Tretha Sciara, MD                              Medical Decision Making Amount and/or Complexity of Data Reviewed Radiology: ordered.  Risk Prescription drug management.    57 year old male with medical history significant for prior CVA, HTN, chronic low back pain, diverticulosis, EM 2, presenting to the emergency department with concern for acute stroke.  The patient states that his last known normal was Thursday when he went to bed.  He woke up on Friday morning with a right-sided facial droop, worsening numbness in the right side of his face, weakness and numbness in the right hemibody.  He states that his residual deficits from his old stroke were paresthesias around the right side of his mouth as well as some occasional paresthesias in his right hemibody.  He did not seek care immediately on Friday and presents to the emergency department today for further evaluation.  He did have a single fall on Saturday, denies any head trauma or loss of consciousness.  He states that he is not on anticoagulation.  He states that he did not strike his neck.  He does endorse acute on chronic low back pain since the fall.  He denies any infectious symptoms.  He arrived to the emerged part GCS 15, ABC intact.  On arrival, the patient was vitally stable, afebrile, not tachycardic or tachypneic, normotensive, saturating well on room air.  Physical exam concerning for a subtle right facial droop, right hemibody weakness with decreased sensation to light touch in the right hemibody and right face.  Concern for subacute stroke given the duration of the patient's symptoms with last known normal of Thursday night.  Recent fall with midline tenderness of the lumbar spine.  Will obtain CT imaging of the lower lumbar spine to further evaluate.  Initial CBG revealed hyperglycemia to 388.  The patient is on glipizide outpatient.'s are an LR bolus which was subsequently ordered at time of signout.  Patient administered 50 mcg of fentanyl for  pain control given his acute low back pain.  His laboratory evaluation was significant for hyperglycemia without an anion gap acidosis with a blood glucose of 451, serum potassium 3.3.  Considered stroke recrudescence in the setting of the patient's hyperglycemia versus acute/subacute stroke.  Remainder of patient's laboratory workup was generally unremarkable.  A CT of the head was performed which was without acute intracranial abnormality.  CT of the lumbar spine was ordered and pending.  MRI of the brain was ordered and pending.  Spoke with Dr. Selina Cooley of on-call neurology who recommended MRI of the brain.  Dr. Selina Cooley given the patient's hyperglycemia recommended following up on the patient's MRI of the brain prior to admission for further stroke workup.  Plan at time of signout to follow-up the patient's CT of the lumbar spine, MRI of the brain, reassess the patient following this imaging.  Plan also to reassess the patient's hyperglycemia following an IV fluid bolus.  Signout given to Dr. Doran Durand at 1600.   Final Clinical Impression(s) / ED Diagnoses Final diagnoses:  Weakness  Facial droop  Hyperglycemia    Rx / DC Orders ED Discharge Orders     None         Ernie Avena, MD 12/19/22 [phone]

## 2022-12-20 ENCOUNTER — Other Ambulatory Visit: Payer: Self-pay | Admitting: Physician Assistant

## 2022-12-20 ENCOUNTER — Inpatient Hospital Stay (HOSPITAL_COMMUNITY): Payer: Medicaid Other

## 2022-12-20 DIAGNOSIS — I6389 Other cerebral infarction: Secondary | ICD-10-CM

## 2022-12-20 DIAGNOSIS — E119 Type 2 diabetes mellitus without complications: Secondary | ICD-10-CM

## 2022-12-20 DIAGNOSIS — M5431 Sciatica, right side: Secondary | ICD-10-CM

## 2022-12-20 DIAGNOSIS — I6300 Cerebral infarction due to thrombosis of unspecified precerebral artery: Secondary | ICD-10-CM | POA: Diagnosis not present

## 2022-12-20 DIAGNOSIS — K6389 Other specified diseases of intestine: Secondary | ICD-10-CM | POA: Diagnosis not present

## 2022-12-20 DIAGNOSIS — I10 Essential (primary) hypertension: Secondary | ICD-10-CM

## 2022-12-20 DIAGNOSIS — I4891 Unspecified atrial fibrillation: Secondary | ICD-10-CM

## 2022-12-20 DIAGNOSIS — I63312 Cerebral infarction due to thrombosis of left middle cerebral artery: Secondary | ICD-10-CM

## 2022-12-20 DIAGNOSIS — I639 Cerebral infarction, unspecified: Secondary | ICD-10-CM

## 2022-12-20 LAB — HEMOGLOBIN A1C
Hgb A1c MFr Bld: 9.5 % — ABNORMAL HIGH (ref 4.8–5.6)
Mean Plasma Glucose: 225.95 mg/dL

## 2022-12-20 LAB — LIPID PANEL
Cholesterol: 228 mg/dL — ABNORMAL HIGH (ref 0–200)
HDL: 37 mg/dL — ABNORMAL LOW (ref >40)
LDL Cholesterol: 143 mg/dL — ABNORMAL HIGH (ref 0–99)
Total CHOL/HDL Ratio: 6.2 RATIO
Triglycerides: 241 mg/dL — ABNORMAL HIGH (ref <150)
VLDL: 48 mg/dL — ABNORMAL HIGH (ref 0–40)

## 2022-12-20 LAB — ECHOCARDIOGRAM COMPLETE BUBBLE STUDY
AR max vel: 4.37 cm2
AV Area VTI: 4.3 cm2
AV Area mean vel: 4.34 cm2
AV Mean grad: 3 mmHg
AV Peak grad: 4.8 mmHg
Ao pk vel: 1.09 m/s
Area-P 1/2: 1.97 cm2
S' Lateral: 2.7 cm

## 2022-12-20 LAB — GLUCOSE, CAPILLARY
Glucose-Capillary: 237 mg/dL — ABNORMAL HIGH (ref 70–99)
Glucose-Capillary: 165 mg/dL — ABNORMAL HIGH (ref 70–99)
Glucose-Capillary: 279 mg/dL — ABNORMAL HIGH (ref 70–99)

## 2022-12-20 MED ORDER — OXYCODONE HCL 5 MG PO TABS
5.0000 mg | ORAL_TABLET | Freq: Four times a day (QID) | ORAL | Status: DC | PRN
  Administered 2022-12-20: 5 mg via ORAL
  Filled 2022-12-20: qty 1

## 2022-12-20 MED ORDER — GABAPENTIN 100 MG PO CAPS: 200.0000 mg | ORAL_CAPSULE | Freq: Three times a day (TID) | ORAL | 0 refills | Status: DC

## 2022-12-20 MED ORDER — CLOPIDOGREL BISULFATE 75 MG PO TABS: 75.0000 mg | ORAL_TABLET | Freq: Every day | ORAL | 0 refills | Status: DC

## 2022-12-20 MED ORDER — LISINOPRIL 20 MG PO TABS: 20.0000 mg | ORAL_TABLET | Freq: Every day | ORAL | 0 refills | Status: AC

## 2022-12-20 MED ORDER — INSULIN STARTER KIT- PEN NEEDLES (ENGLISH)
1.0000 | Freq: Once | Status: AC
  Administered 2022-12-20: 1
  Filled 2022-12-20: qty 1

## 2022-12-20 MED ORDER — EZETIMIBE 10 MG PO TABS
10.0000 mg | ORAL_TABLET | Freq: Every day | ORAL | Status: DC
  Administered 2022-12-20: 10 mg via ORAL
  Filled 2022-12-20: qty 1

## 2022-12-20 MED ORDER — HYDROMORPHONE HCL 1 MG/ML IJ SOLN
0.5000 mg | Freq: Once | INTRAMUSCULAR | Status: AC | PRN
  Administered 2022-12-20: 0.5 mg via INTRAVENOUS
  Filled 2022-12-20: qty 0.5

## 2022-12-20 MED ORDER — OXYCODONE HCL 5 MG PO TABS: 5.0000 mg | ORAL_TABLET | Freq: Four times a day (QID) | ORAL | Status: DC | PRN

## 2022-12-20 MED ORDER — BLOOD GLUCOSE METER KIT: PACK | 0 refills | Status: DC

## 2022-12-20 MED ORDER — GABAPENTIN 100 MG PO CAPS
200.0000 mg | ORAL_CAPSULE | Freq: Three times a day (TID) | ORAL | Status: DC
  Administered 2022-12-20: 200 mg via ORAL
  Filled 2022-12-20: qty 2

## 2022-12-20 MED ORDER — ASPIRIN 81 MG PO TBEC: 81.0000 mg | DELAYED_RELEASE_TABLET | Freq: Every day | ORAL | 12 refills | Status: DC

## 2022-12-20 MED ORDER — EZETIMIBE 10 MG PO TABS: 10.0000 mg | ORAL_TABLET | Freq: Every day | ORAL | 0 refills | Status: AC

## 2022-12-20 NOTE — Final Progress Note (Signed)
Pt is discharged to home with family. All personal belongings returned to the patient. PIV removed. Discharge instruction provided by virtual RN Heather. All questions were answered. Left the floor at 1554 via wheelchair. No complaints at departure.

## 2022-12-20 NOTE — Progress Notes (Addendum)
STROKE TEAM PROGRESS NOTE   INTERVAL HISTORY Patient is seen in his room with no family at the bedside.  5 days ago, he noticed right-sided numbness as well as right-sided facial droop.  He also experienced right lower extremity weakness with several falls.  Symptoms have improved over time. MRI scan of the brain shows small left parietal subcortical infarct.  CT angiogram shows mild atherosclerotic changes.  Hemoglobin A1c is 9.5. Vitals:   12/20/22 0321 12/20/22 0543 12/20/22 0827 12/20/22 1149  BP: (!) 153/92  (!) 147/96 127/88  Pulse: 72  66 95  Resp: 17  17 20   Temp: 98.4 F (36.9 C)  98.4 F (36.9 C) 98.1 F (36.7 C)  TempSrc: Oral  Oral Oral  SpO2: 98%  97% 97%  Weight:  82.4 kg    Height:  5\' 8"  (1.727 m)     CBC:  Recent Labs  Lab 12/19/22 1339 12/19/22 1355  WBC 10.4  --   NEUTROABS 7.3  --   HGB 15.7 15.6  HCT 45.0 46.0  MCV 89.1  --   PLT 191  --    Basic Metabolic Panel:  Recent Labs  Lab 12/19/22 1339 12/19/22 1355  NA 131* 133*  K 3.3* 3.6  CL 98 97*  CO2 22  --   GLUCOSE 451* 454*  BUN 20 21*  CREATININE 0.82 0.60*  CALCIUM 8.8*  --    Lipid Panel:  Recent Labs  Lab 12/19/22 1339  CHOL 228*  TRIG 241*  HDL 37*  CHOLHDL 6.2  VLDL 48*  LDLCALC 143*   HgbA1c:  Recent Labs  Lab 12/19/22 1834  HGBA1C 9.5*   Urine Drug Screen: No results for input(s): "LABOPIA", "COCAINSCRNUR", "LABBENZ", "AMPHETMU", "THCU", "LABBARB" in the last 168 hours.  Alcohol Level  Recent Labs  Lab 12/19/22 1339  ETH <10    IMAGING past 24 hours ECHOCARDIOGRAM COMPLETE BUBBLE STUDY  Result Date: 12/20/2022    ECHOCARDIOGRAM REPORT   Patient Name:   Theodore Hinton Date of Exam: 12/20/2022 Medical Rec #:  Tommas Olp       Height:       68.0 in Accession #:    12/22/2022      Weight:       181.7 lb Date of Birth:  [birth year]       BSA:          1.962 m Patient Age:    56 years        BP:           153/92 mmHg Patient Gender: M               HR:           64 bpm.  Exam Location:  Inpatient Procedure: 2D Echo, Cardiac Doppler, Color Doppler and Saline Contrast Bubble            Study Indications:    Stroke  History:        Patient has prior history of Echocardiogram examinations, most                 recent 06/19/2022. Risk Factors:Diabetes. Hx stroke. Smokeless                 tobacco.  Sonographer:    11/09/1966 RDCS (AE) Referring Phys: 06/21/2022 Ross Ludwig [facility] IMPRESSIONS  1. Left ventricular ejection fraction, by estimation, is 65 to 70%. The left ventricle has normal function. The left ventricle has no regional wall  motion abnormalities. There is mild asymmetric left ventricular hypertrophy of the basal-septal segment. Left ventricular diastolic parameters are consistent with Grade I diastolic dysfunction (impaired relaxation).  2. Right ventricular systolic function is normal. The right ventricular size is normal. Tricuspid regurgitation signal is inadequate for assessing PA pressure.  3. The mitral valve is grossly normal. Trivial mitral valve regurgitation. No evidence of mitral stenosis.  4. The aortic valve is tricuspid. Aortic valve regurgitation is not visualized. No aortic stenosis is present.  5. Aortic dilatation noted. There is borderline dilatation of the aortic root, measuring 38 mm.  6. The inferior vena cava is normal in size with greater than 50% respiratory variability, suggesting right atrial pressure of 3 mmHg.  7. Agitated saline contrast bubble study was negative, with no evidence of any interatrial shunt. Comparison(s): No significant change from prior study. Conclusion(s)/Recommendation(s): No intracardiac source of embolism detected on this transthoracic study. Consider a transesophageal echocardiogram to exclude cardiac source of embolism if clinically indicated. FINDINGS  Left Ventricle: Left ventricular ejection fraction, by estimation, is 65 to 70%. The left ventricle has normal function. The left ventricle has no regional wall motion  abnormalities. The left ventricular internal cavity size was normal in size. There is  mild asymmetric left ventricular hypertrophy of the basal-septal segment. Left ventricular diastolic parameters are consistent with Grade I diastolic dysfunction (impaired relaxation). Right Ventricle: The right ventricular size is normal. No increase in right ventricular wall thickness. Right ventricular systolic function is normal. Tricuspid regurgitation signal is inadequate for assessing PA pressure. Left Atrium: Left atrial size was normal in size. Right Atrium: Right atrial size was normal in size. Pericardium: There is no evidence of pericardial effusion. Mitral Valve: The mitral valve is grossly normal. There is mild thickening of the mitral valve leaflet(s). Trivial mitral valve regurgitation. No evidence of mitral valve stenosis. Tricuspid Valve: The tricuspid valve is normal in structure. Tricuspid valve regurgitation is trivial. Aortic Valve: The aortic valve is tricuspid. Aortic valve regurgitation is not visualized. No aortic stenosis is present. Aortic valve mean gradient measures 3.0 mmHg. Aortic valve peak gradient measures 4.8 mmHg. Aortic valve area, by VTI measures 4.30 cm. Pulmonic Valve: The pulmonic valve was normal in structure. Pulmonic valve regurgitation is trivial. Aorta: Aortic dilatation noted. There is borderline dilatation of the aortic root, measuring 38 mm. Venous: The inferior vena cava is normal in size with greater than 50% respiratory variability, suggesting right atrial pressure of 3 mmHg. IAS/Shunts: The atrial septum is grossly normal. Agitated saline contrast was given intravenously to evaluate for intracardiac shunting. Agitated saline contrast bubble study was negative, with no evidence of any interatrial shunt.  LEFT VENTRICLE PLAX 2D LVIDd:         5.00 cm   Diastology LVIDs:         2.70 cm   LV e' medial:    5.13 cm/s LV PW:         0.90 cm   LV E/e' medial:  11.7 LV IVS:         1.00 cm   LV e' lateral:   5.59 cm/s LVOT diam:     2.50 cm   LV E/e' lateral: 10.8 LV SV:         107 LV SV Index:   55 LVOT Area:     4.91 cm  RIGHT VENTRICLE RV Basal diam:  2.20 cm RV S prime:     15.90 cm/s TAPSE (M-mode): 1.9 cm LEFT ATRIUM  Index        RIGHT ATRIUM          Index LA diam:        3.20 cm 1.63 cm/m   RA Area:     9.71 cm LA Vol (A2C):   50.6 ml 25.79 ml/m  RA Volume:   13.70 ml 6.98 ml/m LA Vol (A4C):   56.3 ml 28.70 ml/m LA Biplane Vol: 54.7 ml 27.88 ml/m  AORTIC VALVE AV Area (Vmax):    4.37 cm AV Area (Vmean):   4.34 cm AV Area (VTI):     4.30 cm AV Vmax:           109.00 cm/s AV Vmean:          74.000 cm/s AV VTI:            0.249 m AV Peak Grad:      4.8 mmHg AV Mean Grad:      3.0 mmHg LVOT Vmax:         97.00 cm/s LVOT Vmean:        65.500 cm/s LVOT VTI:          0.218 m LVOT/AV VTI ratio: 0.88  AORTA Ao Root diam: 3.80 cm Ao Asc diam:  3.40 cm MITRAL VALVE MV Area (PHT): 1.97 cm    SHUNTS MV Decel Time: 385 msec    Systemic VTI:  0.22 m MV E velocity: 60.10 cm/s  Systemic Diam: 2.50 cm MV A velocity: 76.60 cm/s MV E/A ratio:  0.78 Laurance Flatten MD Electronically signed by Laurance Flatten MD Signature Date/Time: 12/20/2022/12:13:58 PM    Final    CT ANGIO HEAD NECK W WO CM  Result Date: 12/19/2022 CLINICAL DATA:  Stroke/TIA EXAM: CT ANGIOGRAPHY HEAD AND NECK TECHNIQUE: Multidetector CT imaging of the head and neck was performed using the standard protocol during bolus administration of intravenous contrast. Multiplanar CT image reconstructions and MIPs were obtained to evaluate the vascular anatomy. Carotid stenosis measurements (when applicable) are obtained utilizing NASCET criteria, using the distal internal carotid diameter as the denominator. RADIATION DOSE REDUCTION: This exam was performed according to the departmental dose-optimization program which includes automated exposure control, adjustment of the mA and/or kV according to patient size and/or  use of iterative reconstruction technique. CONTRAST:  54mL OMNIPAQUE IOHEXOL 350 MG/ML SOLN COMPARISON:  CTA head neck 06/18/2022 head CT 09/24/2022 Brain MRI 12/22/2022 FINDINGS: CT HEAD FINDINGS Brain: There is no mass, hemorrhage or extra-axial collection. The size and configuration of the ventricles and extra-axial CSF spaces are normal. There is no acute or chronic infarction. The brain parenchyma is normal. Skull: The visualized skull base, calvarium and extracranial soft tissues are normal. Sinuses/Orbits: No fluid levels or advanced mucosal thickening of the visualized paranasal sinuses. No mastoid or middle ear effusion. The orbits are normal. CTA NECK FINDINGS SKELETON: There is no bony spinal canal stenosis. No lytic or blastic lesion. OTHER NECK: Normal pharynx, larynx and major salivary glands. No cervical lymphadenopathy. Unremarkable thyroid gland. UPPER CHEST: No pneumothorax or pleural effusion. No nodules or masses. AORTIC ARCH: There is no calcific atherosclerosis of the aortic arch. There is no aneurysm, dissection or hemodynamically significant stenosis of the visualized portion of the aorta. Conventional 3 vessel aortic branching pattern. The visualized proximal subclavian arteries are widely patent. RIGHT CAROTID SYSTEM: No dissection, occlusion or aneurysm. Mild atherosclerotic calcification at the carotid bifurcation without hemodynamically significant stenosis. LEFT CAROTID SYSTEM: No dissection, occlusion or aneurysm. Mild atherosclerotic calcification at the  carotid bifurcation without hemodynamically significant stenosis. VERTEBRAL ARTERIES: Codominant configuration. Both origins are clearly patent. There is no dissection, occlusion or flow-limiting stenosis to the skull base (V1-V3 segments). CTA HEAD FINDINGS POSTERIOR CIRCULATION: --Vertebral arteries: Mild bilateral atherosclerotic calcification without stenosis. Five vertebrobasilar confluence. --Inferior cerebellar arteries:  Normal. --Basilar artery: Normal. --Superior cerebellar arteries: Normal. --Posterior cerebral arteries (PCA): Unchanged severe stenosis of the proximal P2 segment of the right PCA. ANTERIOR CIRCULATION: --Intracranial internal carotid arteries: Atherosclerotic calcification of the internal carotid arteries at the skull base without hemodynamically significant stenosis. --Anterior cerebral arteries (ACA): Normal. Both A1 segments are present. Patent anterior communicating artery (a-comm). --Middle cerebral arteries (MCA): Normal. VENOUS SINUSES: As permitted by contrast timing, patent. ANATOMIC VARIANTS: None Review of the MIP images confirms the above findings. IMPRESSION: 1. No emergent large vessel occlusion. 2. Unchanged severe stenosis of the proximal P2 segment of the right PCA. 3. Mild bilateral carotid bifurcation atherosclerosis without hemodynamically significant stenosis. Electronically Signed   By: Ulyses Jarred M.D.   On: 12/19/2022 19:42   CT Lumbar Spine Wo Contrast  Result Date: 12/19/2022 CLINICAL DATA:  Back trauma, no prior imaging (Age >= 16y). Low back pain, worse after a fall. EXAM: CT LUMBAR SPINE WITHOUT CONTRAST TECHNIQUE: Multidetector CT imaging of the lumbar spine was performed without intravenous contrast administration. Multiplanar CT image reconstructions were also generated. RADIATION DOSE REDUCTION: This exam was performed according to the departmental dose-optimization program which includes automated exposure control, adjustment of the mA and/or kV according to patient size and/or use of iterative reconstruction technique. COMPARISON:  CT abdomen and pelvis 11/01/2022. MRI lumbar spine 07/19/2016. FINDINGS: Segmentation: 5 lumbar type vertebrae. Alignment: Chronic trace retrolisthesis of L2 on L3 and L3 on L4. Vertebrae: Unchanged mild chronic L1 compression fracture. No acute fracture or suspicious osseous lesion. Multiple Schmorl's nodes. Paraspinal and other soft tissues:  Abdominal aortic atherosclerosis without aneurysm. Colonic diverticulosis. Disc levels: Mild disc space narrowing at L1-2 and L3-4 and mild-to-moderate narrowing at L4-5 and L5-S1. T12-L1 mild disc bulging without evidence of significant stenosis. L1-2: Minimal disc bulging and mild facet and ligamentum flavum hypertrophy without evidence of significant stenosis. L2-3: Disc bulging and mild facet hypertrophy result in mild spinal stenosis and bilateral lateral recess stenosis, similar to the prior MRI. No significant neural foraminal stenosis. L3-4: Disc bulging and mild-to-moderate facet hypertrophy result in mild spinal stenosis, bilateral lateral recess stenosis, and mild bilateral neural foraminal stenosis, stable to mildly progressed from the prior MRI. L4-5: Disc bulging and mild-to-moderate facet hypertrophy result in mild spinal stenosis, bilateral lateral recess stenosis, and mild-to-moderate bilateral neural foraminal stenosis, stable to slightly progressed from the prior MRI. L5-S1: Disc bulging, endplate spurring, and moderate right and mild left facet hypertrophy result in severe right and mild left neural foraminal stenosis without significant spinal stenosis, similar to the prior MRI. IMPRESSION: 1. No acute osseous abnormality. 2. 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 3. Severe right neural foraminal stenosis at L5-S1. 4.  Aortic Atherosclerosis (ICD10-I70.0). Electronically Signed   By: Logan Bores M.D.   On: 12/19/2022 17:36   MR BRAIN WO CONTRAST  Result Date: 12/19/2022 CLINICAL DATA:  Provided history: Neuro deficit, acute, stroke suspected. EXAM: MRI HEAD WITHOUT CONTRAST TECHNIQUE: Multiplanar, multiecho pulse sequences of the brain and surrounding structures were obtained without intravenous contrast. COMPARISON:  Head CT 12/19/2022. Brain MRI 09/24/2022. FINDINGS: Brain: No age advanced or lobar  predominant parenchymal  atrophy. 9 mm acute cortical infarct within the left parietal lobe (series 2, images 28 and 29) (series 3, images 8 and 9). Multifocal T2 FLAIR hyperintense signal abnormality within the cerebral white matter, nonspecific but compatible with mild chronic small vessel ischemic disease. Chronic lacunar infarct within the left thalamus. Prominent perivascular space within the right basal ganglia. There is no acute infarct. No evidence of an intracranial mass. No chronic intracranial blood products. No extra-axial fluid collection. No midline shift. Vascular: Maintained flow voids within the proximal large arterial vessels. Skull and upper cervical spine: No focal suspicious marrow lesion. Sinuses/Orbits: No mass or acute finding within the imaged orbits. Minimal mucosal thickening within the left frontal sinus. Opacification of the bilateral ethmoid air cells due to the presence of mucosal thickening and fluid, overall moderate-to-severe. Complete opacification of the right sphenoid sinus. Moderate partial opacification of the left sphenoid sinus to the presence of frothy secretions, a fluid level and mucosal thickening. Other: Small-volume fluid within the bilateral mastoid air cells. IMPRESSION: 1. 9 mm acute cortical infarct within the left parietal lobe. 2. Otherwise stable non-contrast MRI appearance of the brain as compared to 09/24/2022. 3. Mild chronic small vessel ischemic changes within the cerebral white matter. 4. Chronic lacunar infarct within the left thalamus. 5. Paranasal sinus disease, as outlined. 6. Small-volume fluid within the bilateral mastoid air cells. Electronically Signed   By: Jackey LogeKyle  Golden D.O.   On: 12/19/2022 16:54   CT HEAD WO CONTRAST  Result Date: 12/19/2022 CLINICAL DATA:  Neuro deficit, acute, stroke suspected. Right-sided weakness, numbness, and right-sided facial droop beginning 3 days ago. Recent falls. EXAM: CT HEAD WITHOUT CONTRAST TECHNIQUE: Contiguous  axial images were obtained from the base of the skull through the vertex without intravenous contrast. RADIATION DOSE REDUCTION: This exam was performed according to the departmental dose-optimization program which includes automated exposure control, adjustment of the mA and/or kV according to patient size and/or use of iterative reconstruction technique. COMPARISON:  Head CT and MRI 09/24/2022 FINDINGS: Brain: There is no evidence of an acute infarct, intracranial hemorrhage, mass, midline shift, or extra-axial fluid collection. A chronic lacunar infarct is again noted in the left thalamus. The ventricles and sulci are normal. Vascular: Calcified atherosclerosis at the skull base. No hyperdense vessel. Skull: No acute fracture or suspicious osseous lesion. Sinuses/Orbits: Increased, now complete opacification of the right sphenoid sinus. Small to moderate volume fluid in the left sphenoid sinus, slightly decreased from prior. Moderately extensive ethmoid air cell mucosal thickening bilaterally, similar to the prior CT. Clear mastoid air cells. Unremarkable orbits. Other: None. IMPRESSION: 1. No evidence of acute intracranial abnormality. 2. Chronic left thalamic infarct. 3. Persistent/recurrent sinusitis. Electronically Signed   By: Sebastian AcheAllen  Grady M.D.   On: 12/19/2022 15:14    PHYSICAL EXAM General: Alert, well-developed well-nourished Caucasian male in no acute distress Respiratory: Regular, unlabored respirations on room air  NEURO:  Mental Status: AA&Ox3  Speech/Language: speech is without dysarthria or aphasia.    Cranial Nerves:  II: PERRL. Visual fields full.  III, IV, VI: EOMI. Eyelids elevate symmetrically.  V: Sensation is intact to light touch and slightly diminished on the right VII: Mild right facial droop VIII: hearing intact to voice. IX, X: Phonation is normal.  [id number]:Shoulder shrug 5/5. XII: tongue is midline without fasciculations. Motor: 5/5 strength to lateral upper extremities  and left lower extremity, 4+ out of 5 strength to right lower extremities Tone: is normal and bulk is normal Sensation-slightly diminished on the right  Coordination: FTN intact bilaterally.No drift.  Slowed fine finger movements on the right, left hand orbits right Gait- deferred    ASSESSMENT/PLAN Theodore Hinton is a 57 y.o. male with history of diabetes, diverticulosis, hypertension, back pain and stroke in July 2023 presenting with right-sided numbness as well as right-sided facial droop.  He also experienced right lower extremity weakness with several falls.  Symptoms have improved over time.  Stroke:  left cortical parietal infarct Etiology: Likely embolic from cryptogenic source CT head No acute abnormality.  CTA head & neck no LVO, severe stenosis of P2 segment of right PCA MRI 9 mm acute cortical infarct in left parietal lobe 2D Echo EF 65 to 70%, grade 1 diastolic dysfunction, normal left atrial size, no interatrial shunt LDL 143 HgbA1c 9.5 Patient will need 30-day heart monitor on discharge to look for occult A-fib VTE prophylaxis -Lovenox    Diet   Diet heart healthy/carb modified Room service appropriate? Yes; Fluid consistency: Thin   No antithrombotic prior to admission, now on aspirin 81 mg daily and clopidogrel 75 mg daily for 3 weeks then aspirin indefinitely Therapy recommendations: Outpatient PT/OT Disposition: Pending  Hypertension Home meds: Amlodipine 5 mg daily Stable Outside of permissive hypertension window, keep systolic blood pressure less than than 180 Long-term BP goal normotensive  Hyperlipidemia Home meds: Atorvastatin 80 mg daily, resumed in hospital LDL 143, goal < 70 Add ezetimibe 10 mg daily Consider lipid clinic referral Continue statin at discharge  Diabetes type II Uncontrolled Home meds: Glipizide 5 mg daily HgbA1c 9.5, goal < 7.0 CBGs Recent Labs    12/19/22 2155 12/20/22 0641 12/20/22 1150  GLUCAP 206* 165* 279*   Recommend close follow-up with primary care provider for better control of diabetes SSI  Other Stroke Risk Factors Cigarette smoker advised to stop smoking Hx stroke   Other Active Problems None  Hospital day # 1  Cortney E Ernestina Columbia , MSN, AGACNP-BC [facility] See Amion for schedule and pager information 12/20/2022 12:56 PM  I have personally obtained history,examined this patient, reviewed notes, independently viewed imaging studies, participated in medical decision making and plan of care.ROS completed by me personally and pertinent positives fully documented  I have made any additions or clarifications directly to the above note. Agree with note above.  Patient presented with several days history of right-sided paresthesia and difficulty walking with leg weakness and MRI scan shows small left parietal cortical infarct likely embolic etiology from cryptogenic source.  Recommend aspirin and Plavix for 3 weeks followed by aspirin alone.  Aggressive risk factor modification.  Patient will need 30-day heart monitor at discharge to look for paroxysmal A-fib.  Long discussion with patient and answered questions.  Discussed with Dr. Natale Milch.  Follow-up as an outpatient in the stroke clinic with stroke nurse practitioner in 2 months.  Greater than 50% time during this 50-minute visit was spent in counseling and coordination of care about respiratory and stroke and discussion about evaluation and treatment and answering questions.  Delia Heady, MD Medical Director [facility] Pager: [phone] 12/20/2022 2:54 PM   To contact Stroke Continuity provider, please refer to [email or web address]. After hours, contact General Neurology

## 2022-12-20 NOTE — Discharge Summary (Signed)
Physician Discharge Summary  Theodore Hinton ZDG:[id number] DOB: 1966-05-08 DOA: 12/19/2022  PCP: Esau Grew, MD  Admit date: 12/19/2022 Discharge date: 12/20/2022  Admitted From: Home Disposition:  Home  Recommendations for Outpatient Follow-up:  Follow up with PCP in 1-2 weeks Follow-up with neurology as scheduled Follow-up with orthopedic surgery as previously scheduled for further evaluation of hip/back pain  Discharge Condition: Stable CODE STATUS: Full Diet recommendation: Low-salt low-fat low-carb diabetic diet  Brief/Interim Summary: Theodore Hinton is a 57 y.o. 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  Patient presents with new onset paresthesias, found to have acute cortical infarct of the left parietal lobe on MRI.  Transition to aspirin and Plavix for 21 days then aspirin indefinitely thereafter.  Continue hypertensive control with amlodipine and lisinopril, continue high-dose atorvastatin, supplement with Zetia given ongoing elevated LDL despite high-dose statin, lengthy discussion about need for dietary compliance as well.  At this time patient is otherwise stable and agreeable for discharge home with close outpatient follow-up by PCP, neurology.  Patient was to have repeat imaging by orthopedics in the past 48 hours for chronic hip/back pain.  Recommend rescheduling this office visit and imaging as soon as possible per their office.  Discharge Diagnoses:  Principal Problem:   Stroke [place]) Active Problems:   Stroke (cerebrum) (HCC)   Controlled type 2 diabetes mellitus without complication, without long-term current use of insulin (HCC)   Sciatica   HTN (hypertension)   Colonic mass  Acute left parietal stroke, thrombotic, stable  Acute on  chronic sciatica with worsening right leg pain and weakness status post fall Hypertension uncontrolled Uncontrolled insulin-dependent diabetes type 2 with hyperglycemia -A1c 9.5 Pseudohyponatremia Hypokalemia Unspecified colonic mass follow-up with GI for repeat colonoscopy Tobacco use/abuse discussed cessation   Discharge Instructions   Allergies as of 12/20/2022   No Known Allergies      Medication List     TAKE these medications    amLODipine 5 MG tablet Commonly known as: NORVASC Take 1 tablet (5 mg total) by mouth daily.   aspirin EC 81 MG tablet Take 1 tablet (81 mg total) by mouth daily. Swallow whole. Start taking on: December 21, 2022   atorvastatin 80 MG tablet Commonly known as: LIPITOR Take 1 tablet (80 mg total) by mouth daily.   clopidogrel 75 MG tablet Commonly known as: PLAVIX Take 1 tablet (75 mg total) by mouth daily. Start taking on: December 21, 2022   ezetimibe 10 MG tablet Commonly known as: ZETIA Take 1 tablet (10 mg total) by mouth daily.   gabapentin 100 MG capsule Commonly known as: NEURONTIN Take 2 capsules (200 mg total) by mouth 3 (three) times daily.   glipiZIDE 5 MG tablet Commonly known as: GLUCOTROL Take 1 tablet (5 mg total) by mouth daily before breakfast.   lisinopril 20 MG tablet Commonly known as: ZESTRIL Take 1 tablet (20 mg total) by mouth daily. Start taking on: December 21, 2022   ondansetron 4 MG tablet Commonly known as: ZOFRAN Take 1 tablet (4 mg total) by mouth every 8 (eight) hours as needed for nausea or vomiting.   pantoprazole 40 MG tablet Commonly known as: Protonix Take 1 tablet (40 mg total) by mouth daily.        No Known Allergies  Consultations: Neurology  Procedures/Studies: ECHOCARDIOGRAM COMPLETE BUBBLE STUDY  Result Date: 12/20/2022    ECHOCARDIOGRAM REPORT   Patient Name:   Theodore Hinton Date of Exam: 12/20/2022 Medical Rec #:  [id number]       Height:       68.0 in Accession #:     [id number]      Weight:       181.7 lb Date of Birth:  12/18/1965       BSA:          1.962 m Patient Age:    56 years        BP:           153/92 mmHg Patient Gender: M               HR:           64 bpm. Exam Location:  Inpatient Procedure: 2D Echo, Cardiac Doppler, Color Doppler and Saline Contrast Bubble            Study Indications:    Stroke  History:        Patient has prior history of Echocardiogram examinations, most                 recent 06/19/2022. Risk Factors:Diabetes. Hx stroke. Smokeless                 tobacco.  Sonographer:    Ross LudwigArthur Guy RDCS (AE) Referring Phys: [id number] Reyne DumasSTEVI W [facility] CenterOBERMAN IMPRESSIONS  1. Left ventricular ejection fraction, by estimation, is 65 to 70%. The left ventricle has normal function. The left ventricle has no regional wall motion abnormalities. There is mild asymmetric left ventricular hypertrophy of the basal-septal segment. Left ventricular diastolic parameters are consistent with Grade I diastolic dysfunction (impaired relaxation).  2. Right ventricular systolic function is normal. The right ventricular size is normal. Tricuspid regurgitation signal is inadequate for assessing PA pressure.  3. The mitral valve is grossly normal. Trivial mitral valve regurgitation. No evidence of mitral stenosis.  4. The aortic valve is tricuspid. Aortic valve regurgitation is not visualized. No aortic stenosis is present.  5. Aortic dilatation noted. There is borderline dilatation of the aortic root, measuring 38 mm.  6. The inferior vena cava is normal in size with greater than 50% respiratory variability, suggesting right atrial pressure of 3 mmHg.  7. Agitated saline contrast bubble study was negative, with no evidence of any interatrial shunt. Comparison(s): No significant change from prior study. Conclusion(s)/Recommendation(s): No intracardiac source of embolism detected on this transthoracic study. Consider a transesophageal echocardiogram to exclude cardiac source of embolism if  clinically indicated. FINDINGS  Left Ventricle: Left ventricular ejection fraction, by estimation, is 65 to 70%. The left ventricle has normal function. The left ventricle has no regional wall motion abnormalities. The left ventricular internal cavity size was normal in size. There is  mild asymmetric left ventricular hypertrophy of the basal-septal segment. Left ventricular diastolic parameters are consistent with Grade I diastolic dysfunction (impaired relaxation). Right Ventricle: The right ventricular size is normal. No increase in right ventricular wall thickness. Right ventricular systolic function is normal. Tricuspid regurgitation signal is inadequate for assessing PA pressure. Left Atrium: Left atrial size was normal in size. Right Atrium: Right atrial size was normal in size. Pericardium: There is no evidence of pericardial effusion. Mitral Valve: The mitral valve is grossly normal. There is mild thickening of the mitral valve leaflet(s). Trivial mitral valve regurgitation. No evidence of mitral valve stenosis. Tricuspid Valve: The tricuspid valve  is normal in structure. Tricuspid valve regurgitation is trivial. Aortic Valve: The aortic valve is tricuspid. Aortic valve regurgitation is not visualized. No aortic stenosis is present. Aortic valve mean gradient measures 3.0 mmHg. Aortic valve peak gradient measures 4.8 mmHg. Aortic valve area, by VTI measures 4.30 cm. Pulmonic Valve: The pulmonic valve was normal in structure. Pulmonic valve regurgitation is trivial. Aorta: Aortic dilatation noted. There is borderline dilatation of the aortic root, measuring 38 mm. Venous: The inferior vena cava is normal in size with greater than 50% respiratory variability, suggesting right atrial pressure of 3 mmHg. IAS/Shunts: The atrial septum is grossly normal. Agitated saline contrast was given intravenously to evaluate for intracardiac shunting. Agitated saline contrast bubble study was negative, with no evidence of  any interatrial shunt.  LEFT VENTRICLE PLAX 2D LVIDd:         5.00 cm   Diastology LVIDs:         2.70 cm   LV e' medial:    5.13 cm/s LV PW:         0.90 cm   LV E/e' medial:  11.7 LV IVS:        1.00 cm   LV e' lateral:   5.59 cm/s LVOT diam:     2.50 cm   LV E/e' lateral: 10.8 LV SV:         107 LV SV Index:   55 LVOT Area:     4.91 cm  RIGHT VENTRICLE RV Basal diam:  2.20 cm RV S prime:     15.90 cm/s TAPSE (M-mode): 1.9 cm LEFT ATRIUM             Index        RIGHT ATRIUM          Index LA diam:        3.20 cm 1.63 cm/m   RA Area:     9.71 cm LA Vol (A2C):   50.6 ml 25.79 ml/m  RA Volume:   13.70 ml 6.98 ml/m LA Vol (A4C):   56.3 ml 28.70 ml/m LA Biplane Vol: 54.7 ml 27.88 ml/m  AORTIC VALVE AV Area (Vmax):    4.37 cm AV Area (Vmean):   4.34 cm AV Area (VTI):     4.30 cm AV Vmax:           109.00 cm/s AV Vmean:          74.000 cm/s AV VTI:            0.249 m AV Peak Grad:      4.8 mmHg AV Mean Grad:      3.0 mmHg LVOT Vmax:         97.00 cm/s LVOT Vmean:        65.500 cm/s LVOT VTI:          0.218 m LVOT/AV VTI ratio: 0.88  AORTA Ao Root diam: 3.80 cm Ao Asc diam:  3.40 cm MITRAL VALVE MV Area (PHT): 1.97 cm    SHUNTS MV Decel Time: 385 msec    Systemic VTI:  0.22 m MV E velocity: 60.10 cm/s  Systemic Diam: 2.50 cm MV A velocity: 76.60 cm/s MV E/A ratio:  0.78 Gwyndolyn Kaufman MD Electronically signed by Gwyndolyn Kaufman MD Signature Date/Time: 12/20/2022/12:13:58 PM    Final    CT ANGIO HEAD NECK W WO CM  Result Date: 12/19/2022 CLINICAL DATA:  Stroke/TIA EXAM: CT ANGIOGRAPHY HEAD AND NECK TECHNIQUE: Multidetector CT imaging of the head and neck  was performed using the standard protocol during bolus administration of intravenous contrast. Multiplanar CT image reconstructions and MIPs were obtained to evaluate the vascular anatomy. Carotid stenosis measurements (when applicable) are obtained utilizing NASCET criteria, using the distal internal carotid diameter as the denominator. RADIATION DOSE  REDUCTION: This exam was performed according to the departmental dose-optimization program which includes automated exposure control, adjustment of the mA and/or kV according to patient size and/or use of iterative reconstruction technique. CONTRAST:  90mL OMNIPAQUE IOHEXOL 350 MG/ML SOLN COMPARISON:  CTA head neck 06/18/2022 head CT 09/24/2022 Brain MRI 12/22/2022 FINDINGS: CT HEAD FINDINGS Brain: There is no mass, hemorrhage or extra-axial collection. The size and configuration of the ventricles and extra-axial CSF spaces are normal. There is no acute or chronic infarction. The brain parenchyma is normal. Skull: The visualized skull base, calvarium and extracranial soft tissues are normal. Sinuses/Orbits: No fluid levels or advanced mucosal thickening of the visualized paranasal sinuses. No mastoid or middle ear effusion. The orbits are normal. CTA NECK FINDINGS SKELETON: There is no bony spinal canal stenosis. No lytic or blastic lesion. OTHER NECK: Normal pharynx, larynx and major salivary glands. No cervical lymphadenopathy. Unremarkable thyroid gland. UPPER CHEST: No pneumothorax or pleural effusion. No nodules or masses. AORTIC ARCH: There is no calcific atherosclerosis of the aortic arch. There is no aneurysm, dissection or hemodynamically significant stenosis of the visualized portion of the aorta. Conventional 3 vessel aortic branching pattern. The visualized proximal subclavian arteries are widely patent. RIGHT CAROTID SYSTEM: No dissection, occlusion or aneurysm. Mild atherosclerotic calcification at the carotid bifurcation without hemodynamically significant stenosis. LEFT CAROTID SYSTEM: No dissection, occlusion or aneurysm. Mild atherosclerotic calcification at the carotid bifurcation without hemodynamically significant stenosis. VERTEBRAL ARTERIES: Codominant configuration. Both origins are clearly patent. There is no dissection, occlusion or flow-limiting stenosis to the skull base (V1-V3 segments).  CTA HEAD FINDINGS POSTERIOR CIRCULATION: --Vertebral arteries: Mild bilateral atherosclerotic calcification without stenosis. Five vertebrobasilar confluence. --Inferior cerebellar arteries: Normal. --Basilar artery: Normal. --Superior cerebellar arteries: Normal. --Posterior cerebral arteries (PCA): Unchanged severe stenosis of the proximal P2 segment of the right PCA. ANTERIOR CIRCULATION: --Intracranial internal carotid arteries: Atherosclerotic calcification of the internal carotid arteries at the skull base without hemodynamically significant stenosis. --Anterior cerebral arteries (ACA): Normal. Both A1 segments are present. Patent anterior communicating artery (a-comm). --Middle cerebral arteries (MCA): Normal. VENOUS SINUSES: As permitted by contrast timing, patent. ANATOMIC VARIANTS: None Review of the MIP images confirms the above findings. IMPRESSION: 1. No emergent large vessel occlusion. 2. Unchanged severe stenosis of the proximal P2 segment of the right PCA. 3. Mild bilateral carotid bifurcation atherosclerosis without hemodynamically significant stenosis. Electronically Signed   By: Deatra Robinson M.D.   On: 12/19/2022 19:42   CT Lumbar Spine Wo Contrast  Result Date: 12/19/2022 CLINICAL DATA:  Back trauma, no prior imaging (Age >= 16y). Low back pain, worse after a fall. EXAM: CT LUMBAR SPINE WITHOUT CONTRAST TECHNIQUE: Multidetector CT imaging of the lumbar spine was performed without intravenous contrast administration. Multiplanar CT image reconstructions were also generated. RADIATION DOSE REDUCTION: This exam was performed according to the departmental dose-optimization program which includes automated exposure control, adjustment of the mA and/or kV according to patient size and/or use of iterative reconstruction technique. COMPARISON:  CT abdomen and pelvis 11/01/2022. MRI lumbar spine 07/19/2016. FINDINGS: Segmentation: 5 lumbar type vertebrae. Alignment: Chronic trace retrolisthesis of L2  on L3 and L3 on L4. Vertebrae: Unchanged mild chronic L1 compression fracture. No acute fracture or suspicious osseous lesion. Multiple  Schmorl's nodes. Paraspinal and other soft tissues: Abdominal aortic atherosclerosis without aneurysm. Colonic diverticulosis. Disc levels: Mild disc space narrowing at L1-2 and L3-4 and mild-to-moderate narrowing at L4-5 and L5-S1. T12-L1 mild disc bulging without evidence of significant stenosis. L1-2: Minimal disc bulging and mild facet and ligamentum flavum hypertrophy without evidence of significant stenosis. L2-3: Disc bulging and mild facet hypertrophy result in mild spinal stenosis and bilateral lateral recess stenosis, similar to the prior MRI. No significant neural foraminal stenosis. L3-4: Disc bulging and mild-to-moderate facet hypertrophy result in mild spinal stenosis, bilateral lateral recess stenosis, and mild bilateral neural foraminal stenosis, stable to mildly progressed from the prior MRI. L4-5: Disc bulging and mild-to-moderate facet hypertrophy result in mild spinal stenosis, bilateral lateral recess stenosis, and mild-to-moderate bilateral neural foraminal stenosis, stable to slightly progressed from the prior MRI. L5-S1: Disc bulging, endplate spurring, and moderate right and mild left facet hypertrophy result in severe right and mild left neural foraminal stenosis without significant spinal stenosis, similar to the prior MRI. IMPRESSION: 1. No acute osseous abnormality. 2. 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 3. Severe right neural foraminal stenosis at L5-S1. 4.  Aortic Atherosclerosis (ICD10-I70.0). Electronically Signed   By: Logan Bores M.D.   On: 12/19/2022 17:36   MR BRAIN WO CONTRAST  Result Date: 12/19/2022 CLINICAL DATA:  Provided history: Neuro deficit, acute, stroke suspected. EXAM: MRI HEAD WITHOUT CONTRAST TECHNIQUE: Multiplanar, multiecho pulse sequences  of the brain and surrounding structures were obtained without intravenous contrast. COMPARISON:  Head CT 12/19/2022. Brain MRI 09/24/2022. FINDINGS: Brain: No age advanced or lobar predominant parenchymal atrophy. 9 mm acute cortical infarct within the left parietal lobe (series 2, images 28 and 29) (series 3, images 8 and 9). Multifocal T2 FLAIR hyperintense signal abnormality within the cerebral white matter, nonspecific but compatible with mild chronic small vessel ischemic disease. Chronic lacunar infarct within the left thalamus. Prominent perivascular space within the right basal ganglia. There is no acute infarct. No evidence of an intracranial mass. No chronic intracranial blood products. No extra-axial fluid collection. No midline shift. Vascular: Maintained flow voids within the proximal large arterial vessels. Skull and upper cervical spine: No focal suspicious marrow lesion. Sinuses/Orbits: No mass or acute finding within the imaged orbits. Minimal mucosal thickening within the left frontal sinus. Opacification of the bilateral ethmoid air cells due to the presence of mucosal thickening and fluid, overall moderate-to-severe. Complete opacification of the right sphenoid sinus. Moderate partial opacification of the left sphenoid sinus to the presence of frothy secretions, a fluid level and mucosal thickening. Other: Small-volume fluid within the bilateral mastoid air cells. IMPRESSION: 1. 9 mm acute cortical infarct within the left parietal lobe. 2. Otherwise stable non-contrast MRI appearance of the brain as compared to 09/24/2022. 3. Mild chronic small vessel ischemic changes within the cerebral white matter. 4. Chronic lacunar infarct within the left thalamus. 5. Paranasal sinus disease, as outlined. 6. Small-volume fluid within the bilateral mastoid air cells. Electronically Signed   By: Kellie Simmering D.O.   On: 12/19/2022 16:54   CT HEAD WO CONTRAST  Result Date: 12/19/2022 CLINICAL DATA:  Neuro  deficit, acute, stroke suspected. Right-sided weakness, numbness, and right-sided facial droop beginning 3 days ago. Recent falls. EXAM: CT HEAD WITHOUT CONTRAST TECHNIQUE: Contiguous axial images were obtained from the base of the skull through the vertex without intravenous contrast. RADIATION DOSE REDUCTION: This exam was performed according to the  departmental dose-optimization program which includes automated exposure control, adjustment of the mA and/or kV according to patient size and/or use of iterative reconstruction technique. COMPARISON:  Head CT and MRI 09/24/2022 FINDINGS: Brain: There is no evidence of an acute infarct, intracranial hemorrhage, mass, midline shift, or extra-axial fluid collection. A chronic lacunar infarct is again noted in the left thalamus. The ventricles and sulci are normal. Vascular: Calcified atherosclerosis at the skull base. No hyperdense vessel. Skull: No acute fracture or suspicious osseous lesion. Sinuses/Orbits: Increased, now complete opacification of the right sphenoid sinus. Small to moderate volume fluid in the left sphenoid sinus, slightly decreased from prior. Moderately extensive ethmoid air cell mucosal thickening bilaterally, similar to the prior CT. Clear mastoid air cells. Unremarkable orbits. Other: None. IMPRESSION: 1. No evidence of acute intracranial abnormality. 2. Chronic left thalamic infarct. 3. Persistent/recurrent sinusitis. Electronically Signed   By: Sebastian Ache M.D.   On: 12/19/2022 15:14     Subjective: No acute issue/events overnight   Discharge Exam: Vitals:   12/20/22 0827 12/20/22 1149  BP: (!) 147/96 127/88  Pulse: 66 95  Resp: 17 20  Temp: 98.4 F (36.9 C) 98.1 F (36.7 C)  SpO2: 97% 97%   Vitals:   12/20/22 0321 12/20/22 0543 12/20/22 0827 12/20/22 1149  BP: (!) 153/92  (!) 147/96 127/88  Pulse: 72  66 95  Resp: 17  17 20   Temp: 98.4 F (36.9 C)  98.4 F (36.9 C) 98.1 F (36.7 C)  TempSrc: Oral  Oral Oral  SpO2:  98%  97% 97%  Weight:  82.4 kg    Height:  5\' 8"  (1.727 m)      General: Pt is alert, awake, not in acute distress Cardiovascular: RRR, S1/S2 +, no rubs, no gallops Respiratory: CTA bilaterally, no wheezing, no rhonchi Abdominal: Soft, NT, ND, bowel sounds + Extremities: no edema, no cyanosis  The results of significant diagnostics from this hospitalization (including imaging, microbiology, ancillary and laboratory) are listed below for reference.     Microbiology: No results found for this or any previous visit (from the past 240 hour(s)).   Labs:  Basic Metabolic Panel: Recent Labs  Lab 12/19/22 1339 12/19/22 1355  NA 131* 133*  K 3.3* 3.6  CL 98 97*  CO2 22  --   GLUCOSE 451* 454*  BUN 20 21*  CREATININE 0.82 0.60*  CALCIUM 8.8*  --    Liver Function Tests: Recent Labs  Lab 12/19/22 1339  AST 23  ALT 19  ALKPHOS 51  BILITOT 1.7*  PROT 5.9*  ALBUMIN 3.3*   CBC: Recent Labs  Lab 12/19/22 1339 12/19/22 1355  WBC 10.4  --   NEUTROABS 7.3  --   HGB 15.7 15.6  HCT 45.0 46.0  MCV 89.1  --   PLT 191  --    CBG: Recent Labs  Lab 12/19/22 1402 12/19/22 1957 12/19/22 2155 12/20/22 0641 12/20/22 1150  GLUCAP 388* 237* 206* 165* 279*   D-Dimer No results for input(s): "DDIMER" in the last 72 hours. Hgb A1c Recent Labs    12/19/22 1834  HGBA1C 9.5*   Lipid Profile Recent Labs    12/19/22 1339  CHOL 228*  HDL 37*  LDLCALC 143*  TRIG 241*  CHOLHDL 6.2   Urinalysis    Component Value Date/Time   COLORURINE YELLOW 11/01/2022 1531   APPEARANCEUR CLEAR 11/01/2022 1531   LABSPEC 1.015 11/01/2022 1531   PHURINE 7.0 11/01/2022 1531   GLUCOSEU >=500 (A) 11/01/2022 1531  HGBUR NEGATIVE 11/01/2022 1531   BILIRUBINUR NEGATIVE 11/01/2022 1531   KETONESUR NEGATIVE 11/01/2022 1531   PROTEINUR NEGATIVE 11/01/2022 1531   UROBILINOGEN 1.0 06/26/2011 2113   NITRITE NEGATIVE 11/01/2022 1531   LEUKOCYTESUR NEGATIVE 11/01/2022 1531   Sepsis  Labs Recent Labs  Lab 12/19/22 1339  WBC 10.4   Microbiology No results found for this or any previous visit (from the past 240 hour(s)).   Time coordinating discharge: Over 30 minutes  SIGNED:   Azucena FallenWilliam C Wallice Granville, DO [facility] 12/20/2022, 1:10 PM Pager   If 7PM-7AM, please contact night-coverage [email or web address]

## 2022-12-20 NOTE — Plan of Care (Signed)
Problem: Education: Goal: Ability to describe self-care measures that may prevent or decrease complications (Diabetes Survival Skills Education) will improve Outcome: Adequate for Discharge Goal: Individualized Educational Video(s) Outcome: Adequate for Discharge   Problem: Coping: Goal: Ability to adjust to condition or change in health will improve Outcome: Adequate for Discharge   Problem: Fluid Volume: Goal: Ability to maintain a balanced intake and output will improve Outcome: Adequate for Discharge   Problem: Health Behavior/Discharge Planning: Goal: Ability to identify and utilize available resources and services will improve Outcome: Adequate for Discharge Goal: Ability to manage health-related needs will improve Outcome: Adequate for Discharge   Problem: Metabolic: Goal: Ability to maintain appropriate glucose levels will improve Outcome: Adequate for Discharge   Problem: Nutritional: Goal: Maintenance of adequate nutrition will improve Outcome: Adequate for Discharge Goal: Progress toward achieving an optimal weight will improve Outcome: Adequate for Discharge   Problem: Skin Integrity: Goal: Risk for impaired skin integrity will decrease Outcome: Adequate for Discharge   Problem: Tissue Perfusion: Goal: Adequacy of tissue perfusion will improve Outcome: Adequate for Discharge   Problem: Education: Goal: Knowledge of disease or condition will improve Outcome: Adequate for Discharge Goal: Knowledge of secondary prevention will improve (MUST DOCUMENT ALL) Outcome: Adequate for Discharge Goal: Knowledge of patient specific risk factors will improve [place] N/A or DELETE if not current risk factor) Outcome: Adequate for Discharge   Problem: Ischemic Stroke/TIA Tissue Perfusion: Goal: Complications of ischemic stroke/TIA will be minimized Outcome: Adequate for Discharge   Problem: Coping: Goal: Will verbalize positive feelings about self Outcome: Adequate for  Discharge Goal: Will identify appropriate support needs Outcome: Adequate for Discharge   Problem: Health Behavior/Discharge Planning: Goal: Ability to manage health-related needs will improve Outcome: Adequate for Discharge Goal: Goals will be collaboratively established with patient/family Outcome: Adequate for Discharge   Problem: Self-Care: Goal: Ability to participate in self-care as condition permits will improve Outcome: Adequate for Discharge Goal: Verbalization of feelings and concerns over difficulty with self-care will improve Outcome: Adequate for Discharge Goal: Ability to communicate needs accurately will improve Outcome: Adequate for Discharge   Problem: Nutrition: Goal: Risk of aspiration will decrease Outcome: Adequate for Discharge Goal: Dietary intake will improve Outcome: Adequate for Discharge   Problem: Education: Goal: Knowledge of General Education information will improve Description: Including pain rating scale, medication(s)/side effects and non-pharmacologic comfort measures Outcome: Adequate for Discharge   Problem: Health Behavior/Discharge Planning: Goal: Ability to manage health-related needs will improve Outcome: Adequate for Discharge   Problem: Clinical Measurements: Goal: Ability to maintain clinical measurements within normal limits will improve Outcome: Adequate for Discharge Goal: Will remain free from infection Outcome: Adequate for Discharge Goal: Diagnostic test results will improve Outcome: Adequate for Discharge Goal: Respiratory complications will improve Outcome: Adequate for Discharge Goal: Cardiovascular complication will be avoided Outcome: Adequate for Discharge   Problem: Activity: Goal: Risk for activity intolerance will decrease Outcome: Adequate for Discharge   Problem: Nutrition: Goal: Adequate nutrition will be maintained Outcome: Adequate for Discharge   Problem: Coping: Goal: Level of anxiety will  decrease Outcome: Adequate for Discharge   Problem: Elimination: Goal: Will not experience complications related to bowel motility Outcome: Adequate for Discharge Goal: Will not experience complications related to urinary retention Outcome: Adequate for Discharge   Problem: Pain Managment: Goal: General experience of comfort will improve Outcome: Adequate for Discharge   Problem: Safety: Goal: Ability to remain free from injury will improve Outcome: Adequate for Discharge   Problem: Skin Integrity: Goal: Risk for  impaired skin integrity will decrease Outcome: Adequate for Discharge

## 2022-12-20 NOTE — Progress Notes (Signed)
   12/20/22 1155  Spiritual Encounters  Type of Visit Initial  Care provided to: Patient  Referral source Nurse (RN/NT/LPN)  Reason for visit Advance directives   Chaplain responded to a spiritual consult request for advanced directive education.  The patient Theodore Hinton discussed what he needed to complete the documents and the purpose of the documents. I encouraged Theodore Hinton to take his time answering the questions as they are difficult at times. I explained the next steps of letting his nurse know when he has completed the forms and someone will return to prepare for the next steps.   Danice Goltz [facility]  [phone]

## 2022-12-20 NOTE — Progress Notes (Signed)
Physical Therapy Evaluation Patient Details Name: Theodore Hinton MRN: [id number] DOB: 1966-08-28 Today's Date: 12/20/2022  History of Present Illness  57 y.o. male came in 12/19/22 with 4 days of worsening right-sided weakness and numbness. Fell several times. NIHSS=5. 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  Clinical Impression   Pt admitted secondary to problem above with deficits below. PTA patient was walking independently until recent changes to RLE and experienced 3 falls prior to seeking medical attention (he thought his sciatica was worsening).  Pt currently requires min assist to ambulate with RW with one instance of rt foot dragging/toe catching with pt able to correct imbalance with use of RW. Anticipate patient will benefit from PT to address problems listed below.Will continue to follow acutely to maximize functional mobility independence and safety.          Recommendations for follow up therapy are one component of a multi-disciplinary discharge planning process, led by the attending physician.  Recommendations may be updated based on patient status, additional functional criteria and insurance authorization.  Follow Up Recommendations Outpatient PT      Assistance Recommended at Discharge Set up Supervision/Assistance  Patient can return home with the following  A little help with walking and/or transfers;Assistance with cooking/housework;Assist for transportation;Help with stairs or ramp for entrance    Equipment Recommendations Rolling walker (2 wheels)  Recommendations for Other Services       Functional Status Assessment Patient has had a recent decline in their functional status and demonstrates the ability to make significant improvements in function in a reasonable and predictable amount of time.      Precautions / Restrictions Precautions Precautions: Fall Precaution Comments: 3 recent falls      Mobility  Bed Mobility Overal bed mobility: Modified Independent                  Transfers Overall transfer level: Needs assistance Equipment used: Rolling walker (2 wheels) Transfers: Sit to/from Stand Sit to Stand: Min guard           General transfer comment: vc for safe use of RW    Ambulation/Gait Ambulation/Gait assistance: Min assist Gait Distance (Feet): 120 Feet Assistive device: Rolling walker (2 wheels) Gait Pattern/deviations: Step-to pattern, Step-through pattern, Decreased stride length, Decreased dorsiflexion - right, Knee hyperextension - right   Gait velocity interpretation: 1.31 - 2.62 ft/sec, indicative of limited community ambulator   General Gait Details: Initially using step to pattern with RLE leading to decr risk of buckling. Pt locking Rt knee in weight-bearing and progressed to step-through gait without cues. Dragging Rt toe x1; moderate cues for proximity to [employer] Mobility    Modified Rankin (Stroke Patients Only) Modified Rankin (Stroke Patients Only) Pre-Morbid Rankin Score: No significant disability Modified Rankin: Moderately severe disability     Balance Overall balance assessment: History of Falls, Needs assistance   Sitting balance-Leahy Scale: Good       Standing balance-Leahy Scale: Poor                               Pertinent Vitals/Pain Pain Assessment Pain Assessment: 0-10 Pain Score: 9  Pain Location: back Pain Descriptors / Indicators: Aching Pain Intervention(s): Limited activity within patient's tolerance,  Monitored during session, Premedicated before session    [place] expects to be discharged to:: Private residence Living Arrangements: Spouse/significant other;Non-relatives/Friends (employee) Available Help at Discharge:  Family;Available 24 hours/day (employee) Type of Home: House Home Access: Stairs to enter Entrance Stairs-Rails: Right Entrance Stairs-Number of Steps: 1   Home Layout: Two level;Able to live on main level with bedroom/bathroom Home Equipment: None Additional Comments: Owns heating/cooling business    Prior Function Prior Level of Function : Independent/Modified Independent;Driving;Working/employed             Mobility Comments: no device ADLs Comments: Works in [employer]; owns his own company     Hand Dominance   Dominant Hand: Left    Extremity/Trunk Assessment   Upper Extremity Assessment Upper Extremity Assessment: Defer to OT evaluation RUE Deficits / Details: (P) decreased sensation; decreased fine motor/coordination RUE Sensation: decreased light touch;decreased proprioception (grip strength weaker than L; states it feels "more numb" than usual - sensory deficits from previous stroke) RUE Coordination: decreased fine motor;decreased gross motor    Lower Extremity Assessment Lower Extremity Assessment: RLE deficits/detail RLE Deficits / Details: knee extension 3/5 (?effort), ankle DF 4+ RLE Sensation: decreased light touch;history of peripheral neuropathy    Cervical / Trunk Assessment Cervical / Trunk Assessment: Kyphotic;Other exceptions (forhead head - most likely looking to see where R foot is due to sensory changes and weakness)  Communication   Communication: No difficulties  Cognition Arousal/Alertness: Awake/alert Behavior During Therapy: WFL for tasks assessed/performed Overall Cognitive Status: No family/caregiver present to determine baseline cognitive functioning                                          General Comments      Exercises     Assessment/Plan    PT Assessment Patient needs continued PT services  PT Problem List Decreased strength;Decreased balance;Decreased mobility;Decreased cognition;Decreased knowledge of use of  DME;Decreased safety awareness;Decreased knowledge of precautions;Impaired sensation;Pain       PT Treatment Interventions DME instruction;Gait training;Stair training;Functional mobility training;Therapeutic activities;Therapeutic exercise;Balance training;Neuromuscular re-education;Cognitive remediation;Patient/family education    PT Goals (Current goals can be found in the Care Plan section)  Acute Rehab PT Goals Patient Stated Goal: get better PT Goal Formulation: With patient Time For Goal Achievement: 01/03/23 Potential to Achieve Goals: Good    Frequency Min 4X/week     Co-evaluation PT/OT/SLP Co-Evaluation/Treatment: Yes Reason for Co-Treatment: To address functional/ADL transfers;Other (comment) (due to pt's incr pain and recently pre-medicated)           AM-PAC PT "6 Clicks" Mobility  Outcome Measure Help needed turning from your back to your side while in a flat bed without using bedrails?: None Help needed moving from lying on your back to sitting on the side of a flat bed without using bedrails?: None Help needed moving to and from a bed to a chair (including a wheelchair)?: A Little Help needed standing up from a chair using your arms (e.g., wheelchair or bedside chair)?: A Little Help needed to walk in hospital room?: A Little Help needed climbing 3-5 steps with a railing? : A Little 6 Click Score: 20    End of Session Equipment Utilized During Treatment: Gait belt Activity Tolerance: Patient tolerated treatment well Patient left: in chair;with call bell/phone within reach;with chair alarm set;Other (comment) (with OT) Nurse Communication: Mobility status PT Visit Diagnosis: Unsteadiness on feet (R26.81);Repeated  falls (R29.6);Hemiplegia and hemiparesis Hemiplegia - Right/Left: Right Hemiplegia - dominant/non-dominant: Non-dominant Hemiplegia - caused by: Cerebral infarction    Time: 1031-1101 PT Time Calculation (min) (ACUTE ONLY): 30 min   Charges:    PT Evaluation $PT Eval Moderate Complexity: 1 Mod           Jerolyn Center, PT Acute Rehabilitation Services  Office [phone]   Zena Amos 12/20/2022, 12:28 PM

## 2022-12-20 NOTE — Evaluation (Signed)
Occupational Therapy Evaluation Patient Details Name: Theodore Hinton MRN: [id number] DOB: 23-Sep-1966 Today's Date: 12/20/2022   History of Present Illness 57 y.o. male came in 12/19/22 with 4 days of worsening right-sided weakness and numbness. Fell several times. NIHSS=5. 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 Pt had 3 falls PTA and requires min A with mobility and ADL tasks @ RW level due to deficits listed below. At this time recommend neuro outpt OT. Acute OT to follow.      Recommendations for follow up therapy are one component of a multi-disciplinary discharge planning process, led by the attending physician.  Recommendations may be updated based on patient status, additional functional criteria and insurance authorization.   Follow Up Recommendations  Outpatient OT     Assistance Recommended at Discharge Intermittent Supervision/Assistance  Patient can return home with the following A little help with walking and/or transfers;A little help with bathing/dressing/bathroom;Assistance with cooking/housework;Direct supervision/assist for medications management;Direct supervision/assist for financial management;Assist for transportation;Help with stairs or ramp for entrance    Functional Status Assessment  Patient has had a recent decline in their functional status and demonstrates the ability to make significant improvements in function in a reasonable and predictable amount of time.  Equipment Recommendations  Tub/shower seat;Other (comment) (RW)    Recommendations for Other Services       Precautions / Restrictions Precautions Precautions: Fall      Mobility Bed Mobility Overal bed  mobility: Modified Independent                  Transfers Overall transfer level: Needs assistance Equipment used: Rolling walker (2 wheels) Transfers: Sit to/from Stand Sit to Stand: Min guard                  Balance Overall balance assessment: History of Falls, Needs assistance   Sitting balance-Leahy Scale: Good       Standing balance-Leahy Scale: Poor                             ADL either performed or assessed with clinical judgement   ADL Overall ADL's : Needs assistance/impaired Eating/Feeding: Modified independent   Grooming: Set up;Supervision/safety;Standing   Upper Body Bathing: Set up;Sitting   Lower Body Bathing: Min guard   Upper Body Dressing : Set up   Lower Body Dressing: Min guard   Toilet Transfer: Minimal assistance;Ambulation   Toileting- Clothing Manipulation and Hygiene: Min guard       Functional mobility during ADLs: Minimal assistance (frequent cues for position in RW to reduce risk of falls); toe drag noted x 2       Vision Baseline Vision/History: (P) 0 No visual deficits Patient Visual Report: Blurring of vision Vision Assessment?: Yes Eye Alignment: Within Functional Limits Ocular Range of Motion: Within Functional Limits Alignment/Gaze Preference: Within Defined Limits Tracking/Visual Pursuits: Decreased smoothness of horizontal tracking Saccades: Additional eye shifts occurred during testing Convergence: Within functional limits Visual Fields: Other (comment) (no apparent deficits; will further assess; missed 6/15 on letter cancellation task) Additional Comments: (P) reports initial visual deficits/TV looked "fuzzy", however reports that has improved; able to read cose up and distant  Perception Perception Perception Tested?: Yes Perception Deficits:  (difficulty with clock draw task; omitted letters on cancellation task; will further assess) Comments: (P) will further assess   Praxis  Praxis Praxis tested?: Within functional limits    Pertinent Vitals/Pain       Hand Dominance Left   Extremity/Trunk Assessment Upper Extremity Assessment Upper Extremity Assessment: RUE deficits/detail;Generalized weakness RUE Deficits / Details: (P) decreased sensation; decreased fine motor/coordination RUE Sensation: decreased light touch;decreased proprioception (grip strength weaker than L; states it feels "more numb" than usual - sensory deficits from previous stroke) RUE Coordination: decreased fine motor;decreased gross motor   Lower Extremity Assessment Lower Extremity Assessment: Defer to PT evaluation   Cervical / Trunk Assessment Cervical / Trunk Assessment: Kyphotic;Other exceptions (forhead head - most likely looking to see where R foot is due to sensory changes and weakness)   Communication Communication Communication: No difficulties   Cognition     Overall Cognitive Status: No family/caregiver present to determine baseline cognitive functioning                                 General Comments: Difficulty with hand placement on clock draw task; placed hands  at 10:45 instead of 11:10; mod cue to problem solve to correct; decreased awareness of errors; omitted 6/15 on letter cancellation task - did not initiate rechecking work     General Comments       Exercises     Shoulder Instructions      Home Living Family/patient expects to be discharged to:: Private residence Living Arrangements: Spouse/significant other;Non-relatives/Friends (employee) Available Help at Discharge: Family;Available 24 hours/day (employee) Type of Home: House Home Access: Stairs to enter [employer] of Steps: 1 Entrance Stairs-Rails: Right Home Layout: Two level;Able to live on main level with bedroom/bathroom     Bathroom Shower/Tub: Teacher, early years/pre: Standard Bathroom Accessibility: Yes   Home Equipment: None          Prior  Functioning/Environment Prior Level of Function : Independent/Modified Independent;Driving;Working/employed             Mobility Comments: no device ADLs Comments: Works in [employer]; owns his own company        OT Problem List: Decreased strength;Impaired balance (sitting and/or standing);Impaired vision/perception;Decreased coordination;Decreased safety awareness;Decreased cognition;Decreased knowledge of use of DME or AE      OT Treatment/Interventions: Self-care/ADL training;Therapeutic exercise;Neuromuscular education;DME and/or AE instruction;Therapeutic activities;Cognitive remediation/compensation;Visual/perceptual remediation/compensation;Patient/family education;Balance training    OT Goals(Current goals can be found in the care plan section) Acute Rehab OT Goals Patient Stated Goal: to get better OT Goal Formulation: With patient Time For Goal Achievement: 01/03/23 Potential to Achieve Goals: Good  OT Frequency: Min 2X/week    Co-evaluation              AM-PAC OT "6 Clicks" Daily Activity     Outcome Measure Help from another person eating meals?: None Help from another person taking care of personal grooming?: A Little Help from another person toileting, which includes using toliet, bedpan, or urinal?: A Little Help from another person bathing (including washing, rinsing, drying)?: A Little Help from another person to put on and taking off regular upper body clothing?: A Little Help from another person to put on and taking off regular lower body clothing?: A Little 6 Click Score: 19   End of Session Equipment Utilized During Treatment: Gait belt;Rolling walker (2 wheels) Nurse Communication: Mobility  status  Activity Tolerance: Patient tolerated treatment well Patient left: in chair;with call bell/phone within reach;with chair alarm set  OT Visit Diagnosis: Unsteadiness on feet (R26.81);Other abnormalities of gait and mobility (R26.89);Muscle weakness  (generalized) (M62.81);Repeated falls (R29.6);Other symptoms and signs involving cognitive function                Time: 4944-9675 OT Time Calculation (min): 34 min Charges:  OT General Charges $OT Visit: 1 Visit OT Evaluation $OT Eval Moderate Complexity: [place], OT/L   Acute OT Clinical Specialist Acute Rehabilitation Services Pager [phone] Office [phone]   [facility] 12/20/2022, 11:26 AM

## 2022-12-20 NOTE — TOC Initial Note (Signed)
Transition of Care [facility]) - Initial/Assessment Note    Patient Details  Name: Theodore Hinton MRN: [id number] Date of Birth: 06-Sep-1966  Transition of Care [facility]) CM/SW Contact:    Ninfa Meeker, RN Phone Number: 12/20/2022, 10:46 AM  Clinical Narrative:                 Patient is 57 yr old male presented with increased right side weakness, right facial droop.   Transition of Care [facility]) Department has reviewed patient and no TOC needs have been identified at this time. We will continue to monitor patient advancement through Interdisciplinary progressions and if new patient needs arise, please place a consult.       Patient Goals and CMS Choice            Expected Discharge Plan and Services                                              Prior Living Arrangements/Services                       Activities of Daily Living Home Assistive Devices/Equipment: CBG Meter ADL Screening (condition at time of admission) Patient's cognitive ability adequate to safely complete daily activities?: Yes Is the patient deaf or have difficulty hearing?: No Does the patient have difficulty seeing, even when wearing glasses/contacts?: No Does the patient have difficulty concentrating, remembering, or making decisions?: No Patient able to express need for assistance with ADLs?: Yes Does the patient have difficulty dressing or bathing?: No Independently performs ADLs?: Yes (appropriate for developmental age) Does the patient have difficulty walking or climbing stairs?: No Weakness of Legs: Right Weakness of Arms/Hands: None  Permission Sought/Granted                  Emotional Assessment              Admission diagnosis:  Weakness [R53.1] Facial droop [R29.810] Hyperglycemia [R73.9] Stroke [facility]) [I63.9] Patient Active Problem List   Diagnosis Date Noted   Controlled type 2 diabetes mellitus without complication, without long-term current use of insulin  ([place]) 12/19/2022   Sciatica 12/19/2022   HTN (hypertension) 12/19/2022   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

## 2022-12-20 NOTE — Inpatient Diabetes Management (Addendum)
Inpatient Diabetes Program Recommendations  AACE/ADA: New Consensus Statement on Inpatient Glycemic Control (2015)  Target Ranges:  Prepandial:   less than 140 mg/dL      Peak postprandial:   less than 180 mg/dL (1-2 hours)      Critically ill patients:  140 - 180 mg/dL   Lab Results  Component Value Date   GLUCAP 279 (H) 12/20/2022   HGBA1C 9.5 (H) 12/19/2022    Review of Glycemic Control  Latest Reference Range & Units 12/19/22 21:55 12/20/22 06:41 12/20/22 11:50  Glucose-Capillary 70 - 99 mg/dL 206 (H) 165 (H) 279 (H)  (H): Data is abnormally high Diabetes history: Type 2 DM Outpatient Diabetes medications: Glipizide 5 mg QD Current orders for Inpatient glycemic control: Semglee 15 units QD, Novolog 0-20 units TID, Novolog 0-5 units QHS  Inpatient Diabetes Program Recommendations:    Spoke with patient regarding outpatient diabetes management. Verified home medications. Denies missing doses. Had recently stopped taking Ozempic Qwk due to abdominal pain.  Reviewed patient's current A1c of 9.5%. Explained what a A1c is and what it measures. Also reviewed goal A1c with patient, importance of good glucose control @ home, and blood sugar goals. Reviewed patho of DM, role of pancreas, survival skills, interventions, vascular changes and commorbidities.  Patient needs a new meter and was only occasionally using device at home. Feels that test strips have expired. Meter kit [id number]. Reviewed recommended frequency and when to reach out to PCP. Patient's wife to make appointment.  Admits to drinking 6-7 regular sodas a day. Reviewed alternatives to sugary beverages, impact of sodas to pancreas, plate method, basic CHO counting, importance of CHO mindfulness and food options that include protein.  Ordered insulin starter kit and discussed insulin. Not interested in starting at this time. Reviewed current insulin dosages and current trends. Encouraged to discuss with PCP at next visit.   At  discharge, could also consider adding additional oral agent: Januvia 100 mg QD?  Secure chat sent to Dr Avon Gully.   Thanks, Bronson Curb, MSN, RNC-OB Diabetes Coordinator [phone] (8a-5p)

## 2022-12-20 NOTE — Progress Notes (Signed)
  Echocardiogram 2D Echocardiogram has been performed.  Theodore Hinton 12/20/2022, 9:27 AM

## 2022-12-20 NOTE — Progress Notes (Signed)
PT Cancellation Note  Patient Details Name: Theodore Hinton MRN: [id number] DOB: [birth year]   Cancelled Treatment:    Reason Eval/Treat Not Completed: Pain limiting ability to participate  Patient requesting pain medication prior to activity. RN made aware.    [place]  Office [phone]   Rexanne Mano 12/20/2022, 9:37 AM

## 2022-12-20 NOTE — Progress Notes (Signed)
30 day event monitor ordered for stroke  Dr. Phineas Inches to read

## 2022-12-26 ENCOUNTER — Ambulatory Visit: Payer: Medicaid Other | Attending: Physician Assistant

## 2022-12-26 DIAGNOSIS — I4891 Unspecified atrial fibrillation: Secondary | ICD-10-CM | POA: Diagnosis not present

## 2022-12-26 DIAGNOSIS — I639 Cerebral infarction, unspecified: Secondary | ICD-10-CM

## 2022-12-26 DIAGNOSIS — I63312 Cerebral infarction due to thrombosis of left middle cerebral artery: Secondary | ICD-10-CM

## 2022-12-27 DIAGNOSIS — M545 Low back pain, unspecified: Secondary | ICD-10-CM | POA: Diagnosis not present

## 2022-12-27 DIAGNOSIS — G8929 Other chronic pain: Secondary | ICD-10-CM | POA: Diagnosis not present

## 2022-12-27 DIAGNOSIS — R1084 Generalized abdominal pain: Secondary | ICD-10-CM | POA: Diagnosis not present

## 2023-01-09 ENCOUNTER — Encounter (HOSPITAL_COMMUNITY): Payer: Self-pay

## 2023-01-09 ENCOUNTER — Emergency Department (HOSPITAL_COMMUNITY): Payer: Medicaid Other

## 2023-01-09 ENCOUNTER — Other Ambulatory Visit: Payer: Self-pay

## 2023-01-09 ENCOUNTER — Emergency Department (HOSPITAL_COMMUNITY)
Admission: EM | Admit: 2023-01-09 | Discharge: 2023-01-09 | Disposition: A | Discharge status: Home / Self Care | Payer: Medicaid Other | Attending: Student | Admitting: Student

## 2023-01-09 DIAGNOSIS — R531 Weakness: Secondary | ICD-10-CM | POA: Diagnosis not present

## 2023-01-09 DIAGNOSIS — Z79899 Other long term (current) drug therapy: Secondary | ICD-10-CM | POA: Diagnosis not present

## 2023-01-09 DIAGNOSIS — M5430 Sciatica, unspecified side: Secondary | ICD-10-CM | POA: Diagnosis not present

## 2023-01-09 DIAGNOSIS — Z7982 Long term (current) use of aspirin: Secondary | ICD-10-CM | POA: Diagnosis not present

## 2023-01-09 DIAGNOSIS — Z1152 Encounter for screening for COVID-19: Secondary | ICD-10-CM | POA: Insufficient documentation

## 2023-01-09 DIAGNOSIS — D72829 Elevated white blood cell count, unspecified: Secondary | ICD-10-CM | POA: Diagnosis not present

## 2023-01-09 DIAGNOSIS — M545 Low back pain, unspecified: Secondary | ICD-10-CM | POA: Diagnosis not present

## 2023-01-09 DIAGNOSIS — I10 Essential (primary) hypertension: Secondary | ICD-10-CM | POA: Diagnosis not present

## 2023-01-09 DIAGNOSIS — Z7984 Long term (current) use of oral hypoglycemic drugs: Secondary | ICD-10-CM | POA: Insufficient documentation

## 2023-01-09 DIAGNOSIS — Z7902 Long term (current) use of antithrombotics/antiplatelets: Secondary | ICD-10-CM | POA: Diagnosis not present

## 2023-01-09 DIAGNOSIS — I639 Cerebral infarction, unspecified: Secondary | ICD-10-CM | POA: Insufficient documentation

## 2023-01-09 DIAGNOSIS — E119 Type 2 diabetes mellitus without complications: Secondary | ICD-10-CM | POA: Insufficient documentation

## 2023-01-09 LAB — URINALYSIS, ROUTINE W REFLEX MICROSCOPIC
Bacteria, UA: NONE SEEN
Bilirubin Urine: NEGATIVE
Glucose, UA: >=500 mg/dL — AB
Hgb urine dipstick: NEGATIVE
Ketones, ur: 80 mg/dL — AB
Leukocytes,Ua: NEGATIVE
Nitrite: NEGATIVE
Protein, ur: NEGATIVE mg/dL
Specific Gravity, Urine: 1.044 — ABNORMAL HIGH (ref 1.005–1.030)
pH: 5 (ref 5.0–8.0)

## 2023-01-09 LAB — RAPID URINE DRUG SCREEN, HOSP PERFORMED
Amphetamines: NOT DETECTED
Barbiturates: NOT DETECTED
Benzodiazepines: NOT DETECTED
Cocaine: NOT DETECTED
Opiates: NOT DETECTED
Tetrahydrocannabinol: NOT DETECTED

## 2023-01-09 LAB — COMPREHENSIVE METABOLIC PANEL
ALT: 20 U/L (ref 0–44)
AST: 20 U/L (ref 15–41)
Albumin: 4.1 g/dL (ref 3.5–5.0)
Alkaline Phosphatase: 59 U/L (ref 38–126)
Anion gap: 16 — ABNORMAL HIGH (ref 5–15)
BUN: 15 mg/dL (ref 6–20)
CO2: 19 mmol/L — ABNORMAL LOW (ref 22–32)
Calcium: 9.7 mg/dL (ref 8.9–10.3)
Chloride: 101 mmol/L (ref 98–111)
Creatinine, Ser: 0.86 mg/dL (ref 0.61–1.24)
GFR, Estimated: >60 mL/min (ref >60)
Glucose, Bld: 216 mg/dL — ABNORMAL HIGH (ref 70–99)
Potassium: 4 mmol/L (ref 3.5–5.1)
Sodium: 136 mmol/L (ref 135–145)
Total Bilirubin: 1.4 mg/dL — ABNORMAL HIGH (ref 0.3–1.2)
Total Protein: 7.2 g/dL (ref 6.5–8.1)

## 2023-01-09 LAB — RESP PANEL BY RT-PCR (RSV, FLU A&B, COVID)  RVPGX2
Influenza A by PCR: NEGATIVE
Influenza B by PCR: NEGATIVE
Resp Syncytial Virus by PCR: NEGATIVE
SARS Coronavirus 2 by RT PCR: NEGATIVE

## 2023-01-09 LAB — DIFFERENTIAL
Abs Immature Granulocytes: 0.06 10*3/uL (ref 0.00–0.07)
Basophils Absolute: 0.1 10*3/uL (ref 0.0–0.1)
Basophils Relative: 0 %
Eosinophils Absolute: 0.2 10*3/uL (ref 0.0–0.5)
Eosinophils Relative: 1 %
Immature Granulocytes: 0 %
Lymphocytes Relative: 21 %
Lymphs Abs: 3.1 10*3/uL (ref 0.7–4.0)
Monocytes Absolute: 1 10*3/uL (ref 0.1–1.0)
Monocytes Relative: 6 %
Neutro Abs: 10.7 10*3/uL — ABNORMAL HIGH (ref 1.7–7.7)
Neutrophils Relative %: 72 %

## 2023-01-09 LAB — CBC
HCT: 52.3 % — ABNORMAL HIGH (ref 39.0–52.0)
Hemoglobin: 19.2 g/dL — ABNORMAL HIGH (ref 13.0–17.0)
MCH: 31.5 pg (ref 26.0–34.0)
MCHC: 36.7 g/dL — ABNORMAL HIGH (ref 30.0–36.0)
MCV: 85.7 fL (ref 80.0–100.0)
Platelets: 297 10*3/uL (ref 150–400)
RBC: 6.1 MIL/uL — ABNORMAL HIGH (ref 4.22–5.81)
RDW: 12.9 % (ref 11.5–15.5)
WBC: 15.1 10*3/uL — ABNORMAL HIGH (ref 4.0–10.5)
nRBC: 0 % (ref 0.0–0.2)

## 2023-01-09 LAB — CBG MONITORING, ED: Glucose-Capillary: 196 mg/dL — ABNORMAL HIGH (ref 70–99)

## 2023-01-09 LAB — PROTIME-INR
INR: 1 (ref 0.8–1.2)
Prothrombin Time: 13 seconds (ref 11.4–15.2)

## 2023-01-09 LAB — ETHANOL: Alcohol, Ethyl (B): <10 mg/dL (ref <10)

## 2023-01-09 LAB — APTT: aPTT: 25 seconds (ref 24–36)

## 2023-01-09 MED ORDER — CIPROFLOXACIN HCL 500 MG PO TABS: 500.0000 mg | ORAL_TABLET | Freq: Once | ORAL | Status: DC

## 2023-01-09 MED ORDER — GABAPENTIN 100 MG PO CAPS: 200.0000 mg | ORAL_CAPSULE | Freq: Three times a day (TID) | ORAL | 0 refills | Status: DC

## 2023-01-09 MED ORDER — HYDROCODONE-ACETAMINOPHEN 5-325 MG PO TABS: 2.0000 | ORAL_TABLET | ORAL | 0 refills | Status: DC | PRN

## 2023-01-09 MED ORDER — ONDANSETRON HCL 4 MG/2ML IJ SOLN
4.0000 mg | Freq: Once | INTRAMUSCULAR | Status: AC
  Administered 2023-01-09: 4 mg via INTRAVENOUS
  Filled 2023-01-09: qty 2

## 2023-01-09 MED ORDER — MORPHINE SULFATE (PF) 4 MG/ML IV SOLN
4.0000 mg | Freq: Once | INTRAVENOUS | Status: AC
  Administered 2023-01-09: 4 mg via INTRAVENOUS
  Filled 2023-01-09: qty 1

## 2023-01-09 MED ORDER — METRONIDAZOLE 500 MG PO TABS: 500.0000 mg | ORAL_TABLET | Freq: Once | ORAL | Status: DC

## 2023-01-09 MED ORDER — MORPHINE SULFATE (PF) 2 MG/ML IV SOLN
2.0000 mg | Freq: Once | INTRAVENOUS | Status: AC
  Administered 2023-01-09: 2 mg via INTRAVENOUS
  Filled 2023-01-09: qty 1

## 2023-01-09 NOTE — ED Notes (Signed)
Patient transported to MRI 

## 2023-01-09 NOTE — ED Provider Notes (Signed)
[facility] Provider Note  CSN: [id number] Arrival date & time: 01/09/23 1432  Chief Complaint(s) Weakness  HPI Raywood Wailes is a 57 y.o. 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  He states that symptoms have gradually worsened over the last 3 days.  No new trauma.  Endorses some tingling in the saddle region but denies urinary incontinence, fecal incontinence or urinary retention.  Denies chest pain, shortness of breath, headache, fever or other systemic symptoms.   Past Medical History Past Medical History:  Diagnosis Date   Back injury    Chronic back pain    Diabetes mellitus without complication ([place])    Diverticulosis    Hypertension    Stroke [facility])    Patient Active Problem List   Diagnosis Date Noted   Controlled type 2 diabetes mellitus without complication, without long-term current use of insulin ([place]) 12/19/2022   Sciatica 12/19/2022   HTN (hypertension) 12/19/2022   Colonic mass 12/19/2022   Stroke ([place]) 06/18/2022   Stroke (cerebrum) ([place]) 06/18/2022   Home Medication(s) Prior to Admission medications   Medication Sig Start Date End Date Taking? Authorizing Provider  amLODipine (NORVASC) 5 MG tablet Take 1 tablet (5 mg total) by mouth daily. Patient taking differently: Take 10 mg by mouth daily. 01/06/22   Varney Biles, MD  aspirin EC 81 MG tablet Take 1 tablet (81 mg total) by mouth daily. Swallow whole. 12/21/22   Little Ishikawa, MD  atorvastatin (LIPITOR) 80 MG tablet Take 1 tablet (80 mg total) by mouth daily. 06/21/22   Bailey-Modzik, Delila A, NP  blood glucose meter kit and supplies Dispense based on patient and insurance preference. Use up to four times daily as directed. (FOR ICD-10 E10.9,  E11.9). 12/20/22   Little Ishikawa, MD  clopidogrel (PLAVIX) 75 MG tablet Take 1 tablet (75 mg total) by mouth daily. 12/21/22   Little Ishikawa, MD  ezetimibe (ZETIA) 10 MG tablet Take 1 tablet (10 mg total) by mouth daily. 12/20/22   Little Ishikawa, MD  FARXIGA 5 MG TABS tablet Take 5 mg by mouth daily. 09/28/22   [provider]  gabapentin (NEURONTIN) 100 MG capsule Take 2 capsules (200 mg total) by mouth 3 (three) times daily. 12/20/22   Little Ishikawa, MD  glipiZIDE (GLUCOTROL) 5 MG tablet Take 1 tablet (5 mg total) by mouth daily before breakfast. 06/20/22 12/20/22  Bailey-Modzik, Delila A, NP  lisinopril (ZESTRIL) 20 MG tablet Take 1 tablet (20 mg total) by mouth daily. 12/21/22   Little Ishikawa, MD  ondansetron (ZOFRAN) 4 MG tablet Take 1 tablet (4 mg total) by mouth every 8 (eight) hours as needed for nausea or vomiting. 11/01/22   Hayden Rasmussen, MD  oxyCODONE-acetaminophen (PERCOCET) 7.5-325 MG tablet Take 1 tablet by mouth every 4 (four) hours as needed for moderate pain. 12/02/22   [provider]  pantoprazole (PROTONIX) 40 MG tablet Take 1 tablet (40 mg total) by mouth daily. 11/01/22   Hayden Rasmussen, MD  promethazine (PHENERGAN) 25 MG tablet Take 25 mg by mouth every 6 (six) hours as needed for nausea or vomiting. 11/10/22   [provider]  tiZANidine (ZANAFLEX) 4 MG tablet Take 4 mg by mouth 3 (three) times daily as needed for muscle  spasms. 12/17/22   [provider]                                                                                                                                    Past Surgical History History reviewed. No pertinent surgical history. Family History History reviewed. No pertinent family history.  Social History Social History   Tobacco Use   Smoking status: Never   Smokeless tobacco: Former    Types: Associate Professor Use: Never used  Substance Use Topics   Alcohol use:  Not Currently    Comment: occasionally   Drug use: No   Allergies Patient has no known allergies.  Review of Systems Review of Systems  Musculoskeletal:  Positive for back pain.  Neurological:  Positive for weakness and numbness.    Physical Exam Vital Signs  I have reviewed the triage vital signs BP (!) 156/109 (BP Location: Right Arm)   Pulse 97   Temp 97.9 F (36.6 C) (Oral)   Resp 17   Ht 5\' 8"  (1.727 m)   Wt 79.4 kg   SpO2 98%   BMI 26.61 kg/m   Physical Exam Vitals and nursing note reviewed.  Constitutional:      General: He is not in acute distress.    Appearance: He is well-developed.  HENT:     Head: Normocephalic and atraumatic.  Eyes:     Conjunctiva/sclera: Conjunctivae normal.  Cardiovascular:     Rate and Rhythm: Normal rate and regular rhythm.     Heart sounds: No murmur heard. Pulmonary:     Effort: Pulmonary effort is normal. No respiratory distress.     Breath sounds: Normal breath sounds.  Abdominal:     Palpations: Abdomen is soft.     Tenderness: There is no abdominal tenderness.  Musculoskeletal:        General: No swelling.     Cervical back: Neck supple.  Skin:    General: Skin is warm and dry.     Capillary Refill: Capillary refill takes less than 2 seconds.  Neurological:     Mental Status: He is alert.     Sensory: Sensory deficit present.     Motor: Weakness present.  Psychiatric:        Mood and Affect: Mood normal.     ED Results and Treatments Labs (all labs ordered are listed, but only abnormal results are displayed) Labs Reviewed  CBC - Abnormal; Notable for the following components:      Result Value   WBC 15.1 (*)    RBC 6.10 (*)    Hemoglobin 19.2 (*)    HCT 52.3 (*)    MCHC 36.7 (*)    All other components within normal limits  DIFFERENTIAL - Abnormal; Notable for the following components:   Neutro Abs 10.7 (*)    All other components within normal limits  COMPREHENSIVE METABOLIC PANEL -  Abnormal; Notable  for the following components:   CO2 19 (*)    Glucose, Bld 216 (*)    Total Bilirubin 1.4 (*)    Anion gap 16 (*)    All other components within normal limits  URINALYSIS, ROUTINE W REFLEX MICROSCOPIC - Abnormal; Notable for the following components:   Specific Gravity, Urine 1.044 (*)    Glucose, UA >=500 (*)    Ketones, ur 80 (*)    All other components within normal limits  CBG MONITORING, ED - Abnormal; Notable for the following components:   Glucose-Capillary 196 (*)    All other components within normal limits  RESP PANEL BY RT-PCR (RSV, FLU A&B, COVID)  RVPGX2  PROTIME-INR  APTT  ETHANOL  RAPID URINE DRUG SCREEN, HOSP PERFORMED                                                                                                                          Radiology CT HEAD WO CONTRAST  Result Date: 01/09/2023 CLINICAL DATA:  Gradual onset of right face, arm and leg numbness along with weakness. EXAM: CT HEAD WITHOUT CONTRAST TECHNIQUE: Contiguous axial images were obtained from the base of the skull through the vertex without intravenous contrast. RADIATION DOSE REDUCTION: This exam was performed according to the departmental dose-optimization program which includes automated exposure control, adjustment of the mA and/or kV according to patient size and/or use of iterative reconstruction technique. COMPARISON:  Head CT, 12/19/2022. FINDINGS: Brain: No evidence of acute infarction, hemorrhage, hydrocephalus, extra-axial collection or mass lesion/mass effect. Small left thalamus lacunar infarct is stable. Vascular: No hyperdense vessel or unexpected calcification. Skull: Normal. Negative for fracture or focal lesion. Sinuses/Orbits: Globes and orbits are unremarkable. Mild sinus mucosal thickening, ethmoids, maxillary and sphenoid sinuses. Dependent fluid in the left sphenoid sinus. Other: None. IMPRESSION: 1. No acute intracranial abnormalities. 2. Sinus disease improved compared to the recent  prior head CT. Electronically Signed   By: Lajean Manes M.D.   On: 01/09/2023 16:22    Pertinent labs & imaging results that were available during my care of the patient were reviewed by me and considered in my medical decision making (see MDM for details).  Medications Ordered in ED Medications  morphine (PF) 4 MG/ML injection 4 mg (4 mg Intravenous Given 01/09/23 1753)  ondansetron (ZOFRAN) injection 4 mg (4 mg Intravenous Given 01/09/23 1751)  Procedures Procedures  (including critical care time)  Medical Decision Making / ED Course   This patient presents to the ED for concern of back pain, numbness, weakness, this involves an extensive number of treatment options, and is a complaint that carries with it a high risk of complications and morbidity.  The differential diagnosis includes spinal stenosis, cauda equina, nerve root impingement, progression of underlying stroke, new stroke  MDM: Patient seen emergency room for evaluation of back pain, numbness, weakness of lower extremity.  Physical exam with mild facial droop on the right and facial numbness on the right as well as some right lower extremity numbness, but no significant motor deficit noted.  Patient tender in the L-spine.  Laboratory evaluation with leukocytosis to 15.1, hemoglobin 19.2 and this has been seen previously and labs approximately 2 months ago.  He does not have additional symptoms of polycythemia vera and suspect likely underlying sleep apnea as the cause of his polycythemia today.  MRI brain with no new stroke and just demonstrates the evolution of his old stroke.  MRI L-spine with no significant central stenosis, chronic degenerative changes with a few bulging disks and foraminal stenosis.  Patient requesting to reinitiate his gabapentin therapy he received in the hospital for pain control  and I did refill this prescription as well as give him a short 1 week course of opioid pain medication until his pain medication doctor can refill his prescription next week.  A referral was sent to neurosurgery for his sciatica and patient discharged with outpatient follow-up.   Additional history obtained: -Additional history obtained from friend -External records from outside source obtained and reviewed including: Chart review including previous notes, labs, imaging, consultation notes   Lab Tests: -I ordered, reviewed, and interpreted labs.   The pertinent results include:   Labs Reviewed  CBC - Abnormal; Notable for the following components:      Result Value   WBC 15.1 (*)    RBC 6.10 (*)    Hemoglobin 19.2 (*)    HCT 52.3 (*)    MCHC 36.7 (*)    All other components within normal limits  DIFFERENTIAL - Abnormal; Notable for the following components:   Neutro Abs 10.7 (*)    All other components within normal limits  COMPREHENSIVE METABOLIC PANEL - Abnormal; Notable for the following components:   CO2 19 (*)    Glucose, Bld 216 (*)    Total Bilirubin 1.4 (*)    Anion gap 16 (*)    All other components within normal limits  URINALYSIS, ROUTINE W REFLEX MICROSCOPIC - Abnormal; Notable for the following components:   Specific Gravity, Urine 1.044 (*)    Glucose, UA >=500 (*)    Ketones, ur 80 (*)    All other components within normal limits  CBG MONITORING, ED - Abnormal; Notable for the following components:   Glucose-Capillary 196 (*)    All other components within normal limits  RESP PANEL BY RT-PCR (RSV, FLU A&B, COVID)  RVPGX2  PROTIME-INR  APTT  ETHANOL  RAPID URINE DRUG SCREEN, HOSP PERFORMED        Imaging Studies ordered: I ordered imaging studies including CT head, MRI L-spine, MRI brain I independently visualized and interpreted imaging. I agree with the radiologist interpretation   Medicines ordered and prescription drug management: Meds ordered  this encounter  Medications   morphine (PF) 4 MG/ML injection 4 mg   ondansetron (ZOFRAN) injection 4 mg    -I have reviewed the  patients home medicines and have made adjustments as needed  Critical interventions none    Cardiac Monitoring: The patient was maintained on a cardiac monitor.  I personally viewed and interpreted the cardiac monitored which showed an underlying rhythm of: NSR  Social Determinants of Health:  Factors impacting patients care include: none   Reevaluation: After the interventions noted above, I reevaluated the patient and found that they have :improved  Co morbidities that complicate the patient evaluation  Past Medical History:  Diagnosis Date   Back injury    Chronic back pain    Diabetes mellitus without complication (HCC)    Diverticulosis    Hypertension    Stroke [facility])       Dispostion: I considered admission for this patient, But he currently does not meet inpatient criteria for admission he is safe for discharge with outpatient follow-up     Final Clinical Impression(s) / ED Diagnoses Final diagnoses:  None     @PCDICTATION @    , MD 01/09/23 2307

## 2023-01-09 NOTE — ED Provider Triage Note (Signed)
Emergency Medicine Provider Triage Evaluation Note  Theodore Hinton , a 57 y.o. male  was evaluated in triage.  Pt complains of right-sided numbness and tingling with weakness in his right leg that started sometime last night.  Patient states that he is ambulatory at baseline but has been unable to walk since last night.  He had a previous stroke approximately 2 weeks ago and has residual right-sided weakness but states that this is worse than his baseline.  He has had associated decreased appetite for the last 2 to 3 days as well as nausea and vomiting that started this morning.  Review of Systems  Positive: See HPI Negative: See HPI  Physical Exam  BP (!) 142/100 (BP Location: Right Arm)   Pulse (!) 112   Temp 97.9 F (36.6 C)   Resp 17   Ht 5\' 8"  (1.727 m)   Wt 79.4 kg   SpO2 95%   BMI 26.61 kg/m  Gen:   Awake, no distress   Resp:  Normal effort  MSK:   No lower extremity edema Other:  Alert and oriented, 5/5 strength in bilateral upper extremities, 4+ out of 5 strength in bilateral lower extremities that is slightly worse on the right side, subjective decreased sensation to right face and right upper extremity, slight right sided facial droop pt states is his baseline since prior CVA  Medical Decision Making  Medically screening exam initiated at 2:58 PM.  Appropriate orders placed.  [name] was informed that the remainder of the evaluation will be completed by another provider, this initial triage assessment does not replace that evaluation, and the importance of remaining in the ED until their evaluation is complete.     Suzzette Righter, PA-C 01/09/23 1501

## 2023-01-09 NOTE — ED Triage Notes (Addendum)
Pt presents with gradual onset of numbness to the R face, arms, and legs along with weakness to the R upper and lower extremities. Pt also reports decreased appetite and nausea in the last 4 days with vomiting this AM. Pt with noted facial droop on the R, RUE and RLE weakness on exam. Pt with hx of CVA 2 weeks prior with R side affected.   LKW was last PM, but pt is unable to state an exact time. EDP at pt's side.  No code CVA called at this time.

## 2023-01-31 ENCOUNTER — Ambulatory Visit: Payer: Medicaid Other | Attending: Internal Medicine | Admitting: Internal Medicine

## 2023-01-31 NOTE — Progress Notes (Deleted)
Cardiology Office Note:    Date:  01/31/2023   ID:  Theodore Hinton, DOB 1966/05/14, MRN [id number]  PCP:  Sharon Seller, MD   [place] Providers Cardiologist:  None { Click to update primary MD,subspecialty MD or APP then REFRESH:1}    Referring MD: Sharon Seller, MD   No chief complaint on file. ***  History of Present Illness:    Theodore Hinton is a 57 y.o. 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 No significant arrhythmia.  Past Medical History:  Diagnosis Date   Back injury    Chronic back pain    Diabetes mellitus without complication ([place])    Diverticulosis    Hypertension    Stroke ([place])     No past surgical history on file.  Current Medications: No outpatient medications have been marked as taking for the 01/31/23 encounter (Appointment) with Janina Mayo, MD.     Allergies:   Patient has no known allergies.   Social History   Socioeconomic History   Marital status: Single    Spouse name: Not on file   Number of children: Not on file   Years of education: Not on file   Highest education level: Not on file  Occupational History   Not on file  Tobacco Use   Smoking status: Never   Smokeless tobacco: Former    Types: Nurse, children's Use: Never used  Substance and Sexual Activity   Alcohol use: Not Currently    Comment: occasionally   Drug use: No   Sexual activity: Not on file  Other Topics Concern   Not on file  Social History Narrative   Not on file   Social Determinants of Health   Financial Resource Strain: Not on file  Food Insecurity: No Food Insecurity (12/19/2022)   Hunger Vital Sign    Worried About Running Out of Food in the Last Year: Never true    Ran Out of Food in the Last Year: Never true  Transportation Needs: No  Transportation Needs (12/19/2022)   PRAPARE - Hydrologist (Medical): No    Lack of Transportation (Non-Medical): No  Physical Activity: Not on file  Stress: Not on file  Social Connections: Not on file     Family History: The patient's ***family history is not on file.  ROS:   Please see the history of present illness.    *** All other systems reviewed and are negative.  EKGs/Labs/Other Studies Reviewed:    The following studies were reviewed today: ***  EKG:  EKG is *** ordered today.  The ekg ordered today demonstrates ***  Recent Labs: 01/09/2023: ALT 20; BUN 15; Creatinine, Ser 0.86; Hemoglobin 19.2; Platelets 297; Potassium 4.0; Sodium 136  Recent Lipid Panel    Component Value Date/Time   CHOL 228 (H) 12/19/2022 1339   TRIG 241 (H) 12/19/2022 1339   HDL 37 (L) 12/19/2022 1339   CHOLHDL 6.2 12/19/2022 1339   VLDL 48 (H) 12/19/2022 1339   LDLCALC 143 (H) 12/19/2022 1339     Risk Assessment/Calculations:   {Does this patient have ATRIAL FIBRILLATION?[phone]}  No BP recorded.  {Refresh Note OR Click here to enter BP  :1}***         Physical  Exam:    VS:  There were no vitals taken for this visit.    Wt Readings from Last 3 Encounters:  01/09/23 175 lb (79.4 kg)  12/20/22 181 lb 10.5 oz (82.4 kg)  11/01/22 183 lb (83 kg)     GEN: *** Well nourished, well developed in no acute distress HEENT: Normal NECK: No JVD; No carotid bruits LYMPHATICS: No lymphadenopathy CARDIAC: ***RRR, no murmurs, rubs, gallops RESPIRATORY:  Clear to auscultation without rales, wheezing or rhonchi  ABDOMEN: Soft, non-tender, non-distended MUSCULOSKELETAL:  No edema; No deformity  SKIN: Warm and dry NEUROLOGIC:  Alert and oriented x 3 PSYCHIATRIC:  Normal affect   ASSESSMENT:    No diagnosis found. PLAN:    In order of problems listed above:  Carotid duplex       {Are you ordering a CV Procedure (e.g. stress test, cath, DCCV, TEE,  etc)?   Press F2        :[id number]    Medication Adjustments/Labs and Tests Ordered: Current medicines are reviewed at length with the patient today.  Concerns regarding medicines are outlined above.  No orders of the defined types were placed in this encounter.  No orders of the defined types were placed in this encounter.   There are no Patient Instructions on file for this visit.   Signed, Janina Mayo, MD  01/31/2023 7:41 AM    [place]

## 2023-02-03 ENCOUNTER — Encounter: Payer: Self-pay | Admitting: *Deleted

## 2023-02-26 ENCOUNTER — Emergency Department (HOSPITAL_COMMUNITY): Payer: Medicaid Other

## 2023-02-26 ENCOUNTER — Emergency Department (HOSPITAL_COMMUNITY)
Admission: EM | Admit: 2023-02-26 | Discharge: 2023-02-27 | Disposition: A | Discharge status: Home / Self Care | Payer: Medicaid Other | Attending: Emergency Medicine | Admitting: Emergency Medicine

## 2023-02-26 ENCOUNTER — Other Ambulatory Visit: Payer: Self-pay

## 2023-02-26 ENCOUNTER — Encounter (HOSPITAL_COMMUNITY): Payer: Self-pay | Admitting: Emergency Medicine

## 2023-02-26 DIAGNOSIS — Z7984 Long term (current) use of oral hypoglycemic drugs: Secondary | ICD-10-CM | POA: Diagnosis not present

## 2023-02-26 DIAGNOSIS — R2990 Unspecified symptoms and signs involving the nervous system: Secondary | ICD-10-CM

## 2023-02-26 DIAGNOSIS — R531 Weakness: Secondary | ICD-10-CM | POA: Diagnosis present

## 2023-02-26 DIAGNOSIS — M5441 Lumbago with sciatica, right side: Secondary | ICD-10-CM | POA: Diagnosis not present

## 2023-02-26 DIAGNOSIS — I10 Essential (primary) hypertension: Secondary | ICD-10-CM | POA: Insufficient documentation

## 2023-02-26 DIAGNOSIS — Z79899 Other long term (current) drug therapy: Secondary | ICD-10-CM | POA: Insufficient documentation

## 2023-02-26 DIAGNOSIS — G8929 Other chronic pain: Secondary | ICD-10-CM

## 2023-02-26 DIAGNOSIS — E871 Hypo-osmolality and hyponatremia: Secondary | ICD-10-CM | POA: Diagnosis not present

## 2023-02-26 DIAGNOSIS — I639 Cerebral infarction, unspecified: Secondary | ICD-10-CM | POA: Insufficient documentation

## 2023-02-26 DIAGNOSIS — Z7982 Long term (current) use of aspirin: Secondary | ICD-10-CM | POA: Insufficient documentation

## 2023-02-26 LAB — I-STAT CHEM 8, ED
BUN: 5 mg/dL — ABNORMAL LOW (ref 6–20)
Calcium, Ion: 1.07 mmol/L — ABNORMAL LOW (ref 1.15–1.40)
Chloride: 106 mmol/L (ref 98–111)
Creatinine, Ser: 0.6 mg/dL — ABNORMAL LOW (ref 0.61–1.24)
Glucose, Bld: 193 mg/dL — ABNORMAL HIGH (ref 70–99)
HCT: 48 % (ref 39.0–52.0)
Hemoglobin: 16.3 g/dL (ref 13.0–17.0)
Potassium: 3.8 mmol/L (ref 3.5–5.1)
Sodium: 139 mmol/L (ref 135–145)
TCO2: 20 mmol/L — ABNORMAL LOW (ref 22–32)

## 2023-02-26 LAB — COMPREHENSIVE METABOLIC PANEL
ALT: 13 U/L (ref 0–44)
AST: 16 U/L (ref 15–41)
Albumin: 3.6 g/dL (ref 3.5–5.0)
Alkaline Phosphatase: 49 U/L (ref 38–126)
Anion gap: 9 (ref 5–15)
BUN: 6 mg/dL (ref 6–20)
CO2: 18 mmol/L — ABNORMAL LOW (ref 22–32)
Calcium: 8.7 mg/dL — ABNORMAL LOW (ref 8.9–10.3)
Chloride: 107 mmol/L (ref 98–111)
Creatinine, Ser: 0.71 mg/dL (ref 0.61–1.24)
GFR, Estimated: >60 mL/min (ref >60)
Glucose, Bld: 192 mg/dL — ABNORMAL HIGH (ref 70–99)
Potassium: 3.7 mmol/L (ref 3.5–5.1)
Sodium: 134 mmol/L — ABNORMAL LOW (ref 135–145)
Total Bilirubin: 1.5 mg/dL — ABNORMAL HIGH (ref 0.3–1.2)
Total Protein: 6.2 g/dL — ABNORMAL LOW (ref 6.5–8.1)

## 2023-02-26 LAB — CBC
HCT: 47.8 % (ref 39.0–52.0)
Hemoglobin: 16.5 g/dL (ref 13.0–17.0)
MCH: 32 pg (ref 26.0–34.0)
MCHC: 34.5 g/dL (ref 30.0–36.0)
MCV: 92.6 fL (ref 80.0–100.0)
Platelets: 225 10*3/uL (ref 150–400)
RBC: 5.16 MIL/uL (ref 4.22–5.81)
RDW: 14.2 % (ref 11.5–15.5)
WBC: 8.3 10*3/uL (ref 4.0–10.5)
nRBC: 0 % (ref 0.0–0.2)

## 2023-02-26 LAB — APTT: aPTT: 23 seconds — ABNORMAL LOW (ref 24–36)

## 2023-02-26 LAB — CBG MONITORING, ED
Glucose-Capillary: 195 mg/dL — ABNORMAL HIGH (ref 70–99)
Glucose-Capillary: 150 mg/dL — ABNORMAL HIGH (ref 70–99)

## 2023-02-26 LAB — DIFFERENTIAL
Abs Immature Granulocytes: 0.03 10*3/uL (ref 0.00–0.07)
Basophils Absolute: 0 10*3/uL (ref 0.0–0.1)
Basophils Relative: 1 %
Eosinophils Absolute: 0.2 10*3/uL (ref 0.0–0.5)
Eosinophils Relative: 3 %
Immature Granulocytes: 0 %
Lymphocytes Relative: 27 %
Lymphs Abs: 2.3 10*3/uL (ref 0.7–4.0)
Monocytes Absolute: 0.6 10*3/uL (ref 0.1–1.0)
Monocytes Relative: 7 %
Neutro Abs: 5.2 10*3/uL (ref 1.7–7.7)
Neutrophils Relative %: 62 %

## 2023-02-26 LAB — PROTIME-INR
INR: 1 (ref 0.8–1.2)
Prothrombin Time: 13.4 seconds (ref 11.4–15.2)

## 2023-02-26 LAB — ETHANOL: Alcohol, Ethyl (B): <10 mg/dL (ref <10)

## 2023-02-26 MED ORDER — HYDROMORPHONE HCL 1 MG/ML IJ SOLN
0.5000 mg | Freq: Once | INTRAMUSCULAR | Status: AC
  Administered 2023-02-26: 0.5 mg via INTRAVENOUS
  Filled 2023-02-26: qty 1

## 2023-02-26 MED ORDER — SODIUM CHLORIDE 0.9% FLUSH
3.0000 mL | Freq: Once | INTRAVENOUS | Status: AC
  Administered 2023-02-26: 3 mL via INTRAVENOUS

## 2023-02-26 MED ORDER — HYDRALAZINE HCL 20 MG/ML IJ SOLN
10.0000 mg | Freq: Once | INTRAMUSCULAR | Status: AC
  Administered 2023-02-26: 10 mg via INTRAVENOUS
  Filled 2023-02-26: qty 1

## 2023-02-26 MED ORDER — GADOBUTROL 1 MMOL/ML IV SOLN
7.5000 mL | Freq: Once | INTRAVENOUS | Status: AC | PRN
  Administered 2023-02-26: 7.5 mL via INTRAVENOUS

## 2023-02-26 NOTE — ED Triage Notes (Signed)
Pt reports weakness in right arm that is worse than normal. Pt reports right facial droop and lack of feeling in his right leg. Pt reports last normal at 4:30pm. Pt reports falling a couple times since new symtpoms began.

## 2023-02-26 NOTE — Code Documentation (Signed)
Stroke Response Nurse Documentation Code Documentation  Theodore Hinton is a 57 y.o. male arriving to [facility]  via [employer] on 02/26/2023 with past medical hx of HTN, stroke, diabetes mellitus. On aspirin 81 mg daily and clopidogrel 75 mg daily. Code stroke was activated by ED.   Patient from home where he was LKW at 1630 and now complaining of sensory changes to his right leg. Pt noted to have right arm weakness, right leg weakness and right facial droop. He endorses progressive leg weakness for greater than >1 week. Today he noticed sensory changes to his right leg and asked his friend to drive him to the emergency room.  Stroke team at the bedside upon activation of code stroke. Labs drawn and patient cleared for CT by Dr. Alvino Chapel. Patient to CT with team.   NIHSS 6, see documentation for details and code stroke times. Patient with right arm weakness, right leg weakness, and right decreased sensation on exam.   The following imaging was completed:  CT Head and MRI.   Patient is not a candidate for IV Thrombolytic due to LKW unclear. Patient is not a candidate for IR due to exam and imaging negative for LVO.   Care Plan: code stroke cancelled  Bedside handoff with ED RN Duanne Guess.    Robinette Esters L Jauan Wohl  Rapid Response RN
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
## 2023-02-26 NOTE — ED Notes (Signed)
Pt states he has not eaten all day and would like his BGL taken.

## 2023-02-27 MED ORDER — OXYCODONE-ACETAMINOPHEN 5-325 MG PO TABS
2.0000 | ORAL_TABLET | Freq: Once | ORAL | Status: AC
  Administered 2023-02-27: 2 via ORAL
  Filled 2023-02-27: qty 2

## 2023-02-27 NOTE — Discharge Instructions (Addendum)
You were seen in the ER today for evaluation of your acute on chronic back pain.  Your MRI is grossly unchanged from previous in February.  I am glad you are following up with neurosurgery this week.  Please make sure you keep your appointment and attend so that you can be reevaluated.  It is important that you are using a walker to ambulate and get around given the weakness and sensation differences you feel in your right leg since your stroke.  Please make sure to follow-up with your pain management doctor tomorrow as well.  Please take your blood pressure medications prescribed as her blood pressure is significant elevated. I include information for the hypertension clinic into this discharge paperwork.  They should reach out to the next few days however if they do not, please call to schedule an appointment.  If you have any concerns, new or worsening symptoms, please return to the nearest ER for re-evaluation.   Contact a doctor if: Your pain does not get better with rest or medicine. You have new pain. You have a fever. You lose weight quickly. You have trouble doing your normal activities. One or both of your legs or feet feel weak. One or both of your legs or feet lose feeling (have numbness). Get help right away if: You are not able to control when you pee or poop. You have bad back pain and: You feel like you may vomit (nauseous). You vomit. You have pain in your chest or your belly (abdomen). You have shortness of breath. You faint. These symptoms may be an emergency. Get help right away. Call 911. Do not wait to see if the symptoms will go away. Do not drive yourself to the hospital.

## 2023-02-27 NOTE — ED Notes (Signed)
Pt ambulated with steady gait while using walker
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
## 2023-03-02 ENCOUNTER — Other Ambulatory Visit (HOSPITAL_BASED_OUTPATIENT_CLINIC_OR_DEPARTMENT_OTHER): Payer: Self-pay

## 2023-03-02 ENCOUNTER — Ambulatory Visit (HOSPITAL_BASED_OUTPATIENT_CLINIC_OR_DEPARTMENT_OTHER): Payer: Medicaid Other | Admitting: Family

## 2023-03-09 ENCOUNTER — Encounter: Payer: Self-pay | Admitting: Neurology

## 2023-03-09 ENCOUNTER — Ambulatory Visit: Payer: Medicaid Other | Admitting: Neurology

## 2023-03-09 VITALS — BP 158/92 | HR 96 | Ht 68.0 in | Wt 182.4 lb

## 2023-03-09 DIAGNOSIS — G890 Central pain syndrome: Secondary | ICD-10-CM

## 2023-03-09 DIAGNOSIS — R202 Paresthesia of skin: Secondary | ICD-10-CM

## 2023-03-09 MED ORDER — GABAPENTIN 300 MG PO CAPS: 300.0000 mg | ORAL_CAPSULE | Freq: Three times a day (TID) | ORAL | 11 refills | Status: AC

## 2023-03-09 NOTE — Patient Instructions (Signed)
I had a long d/w patient about his recent lacunar stroke, post stroke dysesthesias risk for recurrent stroke/TIAs, personally independently reviewed imaging studies and stroke evaluation results and answered questions.Continue Plavix 75 mg daily  for secondary stroke prevention and maintain strict control of hypertension with blood pressure goal below 130/90, diabetes with hemoglobin A1c goal below 6.5% and lipids with LDL cholesterol goal below 70 mg/dL. I also advised the patient to eat a healthy diet with plenty of whole grains, cereals, fruits and vegetables, exercise regularly and maintain ideal body weight . Marland Kitchen  Restart gabapentin 300 mg 3 times daily for paresthesias as it was helping him but it has been stopped few weeks ago with worsening of his symptoms.  Follow-up at the pain clinic for his chronic back pain.Followup in the future with my nurse practitioner in 6 months or call earlier if needed.Marland Kitchen

## 2023-03-09 NOTE — Progress Notes (Signed)
[facility] [address] [place]. [place] [address] [phone]       OFFICE FOLLOW-UP NOTE  [name] Date of Birth:  Apr 08, 1966 Medical Record Number:  [id number]   HPI: Initial visit 09/08/2022 [name] is a 57 year old Caucasian male seen today for initial office follow-up visit for stroke.  History is obtained from the patient and review of electronic medical records and I have personally reviewed pertinent available imaging films in PACS.Marland Kitchen  He has past medical history of diabetes, hypertension diverticulosis.  He presented on 06/18/2022 with sudden onset of right-sided facial droop, facial numbness, slurred speech and right arm weakness.  He also felt lightheaded and saw spots in front of his eyes.  He was seen by [facility] given IV TNK question of risk benefits and and after patient consent.  He was transferred to [facility] had careful neurological follow-up and strict blood pressure control.  He did well and right-sided weakness improved but he had residual numbness in the right face and minor extent the right hand.  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  Echocardiogram showed ejection fraction 65 to 70%.  LDL cholesterol 121 mg percent and hemoglobin A1c was 10.3.  Patient was started on aspirin Plavix as well as Lipitor.  He states he has done well since discharge.  He does need physical occupational therapy.  His right-sided strength has improved.  He still has some numbness in the right cheek and left and his taste is altered.  He has very occasional tingling in the right hand.  He has not had any recurrent stroke or TIA symptoms.  He is Recommending alcohol smoke cigarettes.  Patient was advised to stop Plavix after 3 weeks and stay on aspirin but he seems to have stopped both of the medications. Update 03/09/2023 : He returns for follow-up after last visit 6 months ago.  Patient states he  was admitted in January 2024 with a new stroke.  He presented on 12/19/2022 with 4-day history of new right facial droop, worsening numbness of the right face right-sided weakness and numbness.  CT head on admission showed no acute abnormality and CT angiogram showed no LVO but severe stenosis of P2 segment of the right PCA.  MRI scan showed a 9 mm acute left parietal cortical infarct.  2D echo showed ejection fraction of 65 to 70% with grade 1 diastolic dysfunction and normal left atrium size.  LDL cholesterol was elevated at 142 mg percent and hemoglobin A1c 9.5.  30-day outpatient cardiac monitoring did not reveal any paroxysmal A-fib.  Patient was started on aspirin and Plavix for 3 weeks followed by aspirin.  Patient had worsening of his subjective right-sided numbness.  She has previously had left thalamic infarct in July 2023.  He returned back to the ER on 02/26/2023 with persistent subjective worsening of right-sided deficits.  MRI scan of the brain did not show any acute infarct at this time.  Patient states that he had previously been on gabapentin but this was stopped few months ago and may have led to worsening of his right-sided deficits.  He also has chronic back pain and is followed at the pain clinic is currently on Percocet.  He is not helping with.  He is currently on Plavix which is tolerating well without bruising or bleeding. ROS:   14 system review of systems is positive for numbness, tingling, altered taste all other systems negative  PMH:  Past Medical History:  Diagnosis Date   Back injury    Chronic back pain    Diabetes mellitus without complication    Diverticulosis    Hypertension    Stroke     Social History:  Social History   Socioeconomic History   Marital status: Single    Spouse name: Not on file   Number of children: Not on file   Years of education: Not on file   Highest education level: Not on file  Occupational History   Not on file  Tobacco Use    Smoking status: Never   Smokeless tobacco: Former    Types: Nurse, children's Use: Never used  Substance and Sexual Activity   Alcohol use: Not Currently    Comment: occasionally   Drug use: No   Sexual activity: Not on file  Other Topics Concern   Not on file  Social History Narrative   Not on file   Social Determinants of Health   Financial Resource Strain: Not on file  Food Insecurity: No Food Insecurity (12/19/2022)   Hunger Vital Sign    Worried About Running Out of Food in the Last Year: Never true    Ran Out of Food in the Last Year: Never true  Transportation Needs: No Transportation Needs (12/19/2022)   PRAPARE - Hydrologist (Medical): No    Lack of Transportation (Non-Medical): No  Physical Activity: Not on file  Stress: Not on file  Social Connections: Not on file  Intimate Partner Violence: Not At Risk (12/19/2022)   Humiliation, Afraid, Rape, and Kick questionnaire    Fear of Current or Ex-Partner: No    Emotionally Abused: No    Physically Abused: No    Sexually Abused: No    Medications:   Current Outpatient Medications on File Prior to Visit  Medication Sig Dispense Refill   amLODipine (NORVASC) 10 MG tablet Take 10 mg by mouth daily.     amLODipine (NORVASC) 5 MG tablet Take 1 tablet (5 mg total) by mouth daily. 30 tablet 1   aspirin EC 81 MG tablet Take 1 tablet (81 mg total) by mouth daily. Swallow whole. 30 tablet 12   atorvastatin (LIPITOR) 80 MG tablet Take 1 tablet (80 mg total) by mouth daily. 30 tablet 2   blood glucose meter kit and supplies Dispense based on patient and insurance preference. Use up to four times daily as directed. (FOR ICD-10 E10.9, E11.9). 1 each 0   clopidogrel (PLAVIX) 75 MG tablet Take 1 tablet (75 mg total) by mouth daily. 20 tablet 0   ezetimibe (ZETIA) 10 MG tablet Take 1 tablet (10 mg total) by mouth daily. 30 tablet 0   FARXIGA 5 MG TABS tablet Take 5 mg by mouth daily.     glipiZIDE  (GLUCOTROL) 5 MG tablet Take 1 tablet (5 mg total) by mouth daily before breakfast. 30 tablet 2   HYDROcodone-acetaminophen (NORCO/VICODIN) 5-325 MG tablet Take 2 tablets by mouth every 4 (four) hours as needed. 20 tablet 0   lisinopril (ZESTRIL) 20 MG tablet Take 1 tablet (20 mg total) by mouth daily. 30 tablet 0   metoprolol tartrate (LOPRESSOR) 50 MG tablet Take 50 mg by mouth 2 (two) times daily.     ondansetron (ZOFRAN) 4 MG tablet Take 1 tablet (4 mg total) by mouth every 8 (eight) hours as needed for nausea or vomiting. 15 tablet 0   oxyCODONE-acetaminophen (PERCOCET) 10-325 MG  tablet Take 1 tablet by mouth every 4 (four) hours as needed for pain.     oxyCODONE-acetaminophen (PERCOCET) 7.5-325 MG tablet Take 1 tablet by mouth every 4 (four) hours as needed for moderate pain.     pantoprazole (PROTONIX) 40 MG tablet Take 1 tablet (40 mg total) by mouth daily. 30 tablet 0   promethazine (PHENERGAN) 25 MG tablet Take 25 mg by mouth every 6 (six) hours as needed for nausea or vomiting.     tiZANidine (ZANAFLEX) 4 MG tablet Take 4 mg by mouth 3 (three) times daily as needed for muscle spasms.     TUMS E-X 750 750 MG chewable tablet Chew 1 tablet by mouth 2 (two) times daily as needed for heartburn.     No current facility-administered medications on file prior to visit.    Allergies:  No Known Allergies  Physical Exam General: well developed, well nourished middle-aged Caucasian male, seated, in no evident distress Head: head normocephalic and atraumatic.  Neck: supple with no carotid or supraclavicular bruits Cardiovascular: regular rate and rhythm, no murmurs Musculoskeletal: no deformity Skin:  no rash/petichiae Vascular:  Normal pulses all extremities Vitals:   03/09/23 1412  BP: (!) 158/92  Pulse: 96   Neurologic Exam Mental Status: Awake and fully alert. Oriented to place and time. Recent and remote memory intact. Attention span, concentration and fund of knowledge appropriate.  Mood and affect appropriate.  Cranial Nerves: Fundoscopic exam reveals sharp disc margins. Pupils equal, briskly reactive to light. Extraocular movements full without nystagmus. Visual fields full to confrontation. Hearing intact. Facial sensation intact. Face, tongue, palate moves normally and symmetrically.  Motor: Normal bulk and tone. Normal strength in all tested extremity muscles.  Diminished fine finger movements on the right.  Orbits left or right upper extremity.  Mild weakness of right grip.  Right leg weakness 4/5.  With weakness of the right hip flexors and ankle dorsiflexors. Sensory.:  Subjective hyperesthesia on the right to touch ,  pinprick but preserved.position and vibratory sensation.  Except some decreased sensation in the right lower face. Coordination: Rapid alternating movements normal in all extremities. Finger-to-nose and heel-to-shin performed accurately bilaterally. Gait and Station: Arises from chair without difficulty. Stance is normal.  Uses a cane.  Favors right leg while walking Driving.   Reflexes: 2+ and asymmetric and brisker on the right. Toes downgoing.   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  He still has persistent right-sided paresthesias.  Vascular risk factors of diabetes, hyperlipidemia hypertension and intracranial atherosclerosis.  Recent left parietal infarct in January 2024 of cryptogenic etiology.  Worsening of poststroke dysesthesias following recent stroke and after stopping gabapentin     PLAN:I had a long d/w patient about his recent lacunar stroke, post stroke dysesthesias risk for recurrent stroke/TIAs, personally independently reviewed imaging studies and stroke evaluation results and answered questions.Continue Plavix 75 mg daily  for secondary stroke prevention and maintain strict control of hypertension with blood  pressure goal below 130/90, diabetes with hemoglobin A1c goal below 6.5% and lipids with LDL cholesterol goal below 70 mg/dL. I also advised the patient to eat a healthy diet with plenty of whole grains, cereals, fruits and vegetables, exercise regularly and maintain ideal body weight . Marland Kitchen  Restart gabapentin 300 mg 3 times daily for paresthesias as it was helping him but it has been stopped few weeks ago  with worsening of his symptoms.  Follow-up at the pain clinic for his chronic back pain.Followup in the future with my nurse practitioner in 6 months or call earlier if needed..Greater than 50% of time during this 35 minute visit was spent on counseling,explanation of diagnosis, planning of further management, discussion with patient and family and coordination of care Antony Contras, MD Note: This document was prepared with digital dictation and possible smart phrase technology. Any transcriptional errors that result from this process are unintentional

## 2023-06-25 ENCOUNTER — Emergency Department (HOSPITAL_COMMUNITY): Payer: Medicaid Other

## 2023-06-25 ENCOUNTER — Encounter (HOSPITAL_COMMUNITY): Payer: Self-pay

## 2023-06-25 ENCOUNTER — Emergency Department (HOSPITAL_COMMUNITY)
Admission: EM | Admit: 2023-06-25 | Discharge: 2023-06-26 | Disposition: A | Discharge status: Home / Self Care | Payer: Medicaid Other | Attending: Student | Admitting: Student

## 2023-06-25 ENCOUNTER — Other Ambulatory Visit: Payer: Self-pay

## 2023-06-25 DIAGNOSIS — G890 Central pain syndrome: Secondary | ICD-10-CM | POA: Diagnosis not present

## 2023-06-25 DIAGNOSIS — Z79899 Other long term (current) drug therapy: Secondary | ICD-10-CM | POA: Insufficient documentation

## 2023-06-25 DIAGNOSIS — E119 Type 2 diabetes mellitus without complications: Secondary | ICD-10-CM | POA: Diagnosis not present

## 2023-06-25 DIAGNOSIS — Z7901 Long term (current) use of anticoagulants: Secondary | ICD-10-CM | POA: Insufficient documentation

## 2023-06-25 DIAGNOSIS — R200 Anesthesia of skin: Secondary | ICD-10-CM | POA: Diagnosis present

## 2023-06-25 DIAGNOSIS — Z7984 Long term (current) use of oral hypoglycemic drugs: Secondary | ICD-10-CM | POA: Insufficient documentation

## 2023-06-25 DIAGNOSIS — I10 Essential (primary) hypertension: Secondary | ICD-10-CM | POA: Insufficient documentation

## 2023-06-25 LAB — DIFFERENTIAL
Abs Immature Granulocytes: 0.04 10*3/uL (ref 0.00–0.07)
Basophils Absolute: 0.1 10*3/uL (ref 0.0–0.1)
Basophils Relative: 1 %
Eosinophils Absolute: 0.5 10*3/uL (ref 0.0–0.5)
Eosinophils Relative: 5 %
Immature Granulocytes: 0 %
Lymphocytes Relative: 22 %
Lymphs Abs: 2.1 10*3/uL (ref 0.7–4.0)
Monocytes Absolute: 0.5 10*3/uL (ref 0.1–1.0)
Monocytes Relative: 5 %
Neutro Abs: 6.4 10*3/uL (ref 1.7–7.7)
Neutrophils Relative %: 67 %

## 2023-06-25 LAB — CBC
HCT: 50.9 % (ref 39.0–52.0)
Hemoglobin: 18.1 g/dL — ABNORMAL HIGH (ref 13.0–17.0)
MCH: 32.4 pg (ref 26.0–34.0)
MCHC: 35.6 g/dL (ref 30.0–36.0)
MCV: 91.1 fL (ref 80.0–100.0)
Platelets: 224 10*3/uL (ref 150–400)
RBC: 5.59 MIL/uL (ref 4.22–5.81)
RDW: 12.6 % (ref 11.5–15.5)
WBC: 9.5 10*3/uL (ref 4.0–10.5)
nRBC: 0 % (ref 0.0–0.2)

## 2023-06-25 LAB — COMPREHENSIVE METABOLIC PANEL
ALT: 29 U/L (ref 0–44)
AST: 25 U/L (ref 15–41)
Albumin: 3.8 g/dL (ref 3.5–5.0)
Alkaline Phosphatase: 62 U/L (ref 38–126)
Anion gap: 10 (ref 5–15)
BUN: <5 mg/dL — ABNORMAL LOW (ref 6–20)
CO2: 24 mmol/L (ref 22–32)
Calcium: 9 mg/dL (ref 8.9–10.3)
Chloride: 102 mmol/L (ref 98–111)
Creatinine, Ser: 0.68 mg/dL (ref 0.61–1.24)
GFR, Estimated: >60 mL/min (ref >60)
Glucose, Bld: 254 mg/dL — ABNORMAL HIGH (ref 70–99)
Potassium: 4.1 mmol/L (ref 3.5–5.1)
Sodium: 136 mmol/L (ref 135–145)
Total Bilirubin: 1 mg/dL (ref 0.3–1.2)
Total Protein: 6.8 g/dL (ref 6.5–8.1)

## 2023-06-25 LAB — I-STAT CHEM 8, ED
BUN: 5 mg/dL — ABNORMAL LOW (ref 6–20)
Calcium, Ion: 1.19 mmol/L (ref 1.15–1.40)
Chloride: 103 mmol/L (ref 98–111)
Creatinine, Ser: 0.7 mg/dL (ref 0.61–1.24)
Glucose, Bld: 257 mg/dL — ABNORMAL HIGH (ref 70–99)
HCT: 53 % — ABNORMAL HIGH (ref 39.0–52.0)
Hemoglobin: 18 g/dL — ABNORMAL HIGH (ref 13.0–17.0)
Potassium: 4.1 mmol/L (ref 3.5–5.1)
Sodium: 138 mmol/L (ref 135–145)
TCO2: 23 mmol/L (ref 22–32)

## 2023-06-25 LAB — CBG MONITORING, ED: Glucose-Capillary: 219 mg/dL — ABNORMAL HIGH (ref 70–99)

## 2023-06-25 LAB — PROTIME-INR
INR: 1 (ref 0.8–1.2)
Prothrombin Time: 13.4 seconds (ref 11.4–15.2)

## 2023-06-25 LAB — APTT: aPTT: 23 seconds — ABNORMAL LOW (ref 24–36)

## 2023-06-25 LAB — ETHANOL: Alcohol, Ethyl (B): <10 mg/dL (ref <10)

## 2023-06-25 MED ORDER — MORPHINE SULFATE (PF) 4 MG/ML IV SOLN
4.0000 mg | Freq: Once | INTRAVENOUS | Status: AC | PRN
  Administered 2023-06-25: 4 mg via INTRAVENOUS
  Filled 2023-06-25: qty 1

## 2023-06-25 MED ORDER — LACTATED RINGERS IV BOLUS
1000.0000 mL | Freq: Once | INTRAVENOUS | Status: AC
  Administered 2023-06-25: 1000 mL via INTRAVENOUS

## 2023-06-25 MED ORDER — MORPHINE SULFATE (PF) 4 MG/ML IV SOLN
4.0000 mg | Freq: Once | INTRAVENOUS | Status: AC
  Administered 2023-06-25: 4 mg via INTRAVENOUS
  Filled 2023-06-25: qty 1

## 2023-06-25 MED ORDER — DIPHENHYDRAMINE HCL 50 MG/ML IJ SOLN
25.0000 mg | Freq: Once | INTRAMUSCULAR | Status: AC
  Administered 2023-06-25: 25 mg via INTRAVENOUS
  Filled 2023-06-25: qty 1

## 2023-06-25 MED ORDER — ONDANSETRON HCL 4 MG/2ML IJ SOLN
4.0000 mg | Freq: Once | INTRAMUSCULAR | Status: AC
  Administered 2023-06-25: 4 mg via INTRAVENOUS
  Filled 2023-06-25: qty 2

## 2023-06-25 MED ORDER — PROCHLORPERAZINE EDISYLATE 10 MG/2ML IJ SOLN
10.0000 mg | Freq: Once | INTRAMUSCULAR | Status: AC
  Administered 2023-06-25: 10 mg via INTRAVENOUS
  Filled 2023-06-25: qty 2

## 2023-06-25 MED ORDER — SODIUM CHLORIDE 0.9% FLUSH
3.0000 mL | Freq: Once | INTRAVENOUS | Status: AC
  Administered 2023-06-25: 3 mL via INTRAVENOUS

## 2023-06-25 NOTE — ED Triage Notes (Signed)
Pt c/o right sided facial numbness, right leg numbnessx2d. Pt states he fell three times today. LKW 06/23/23

## 2023-06-25 NOTE — ED Provider Notes (Addendum)
[place] EMERGENCY DEPARTMENT AT [facility] Provider Note  CSN: [id number] Arrival date & time: 06/25/23 1637  Chief Complaint(s) Numbness  HPI Jakiah Goree is a 57 y.o. male with PMH previous left thalamic stroke and left parietal cortical infarcts with right arm and right lower extremity weakness who presents emergency room for evaluation of numbness.  Patient states that after hospital discharge in January 2024 with his last stroke, he is has been having improvement of his numbness and weakness on the right side but over the last 2 weeks has had progressive worsening numbness and weakness.  He states that today he fell in the garden because he could not feel his leg.  He is currently endorsing right facial numbness, numbness over the right arm and right leg.  Denies chest pain, shortness of breath, abdominal pain, nausea, vomiting or other systemic symptoms.   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 Authorizing Provider  amLODipine (NORVASC) 10 MG tablet Take 10 mg by mouth daily.    [provider]  amLODipine (NORVASC) 5 MG tablet Take 1 tablet (5 mg total) by mouth daily. 01/06/22   Derwood Kaplan, MD  aspirin EC 81 MG tablet Take 1 tablet (81 mg total) by mouth daily. Swallow whole. 12/21/22   Azucena Fallen, MD  atorvastatin (LIPITOR) 80 MG tablet Take 1 tablet (80 mg total) by mouth daily.  06/21/22   Bailey-Modzik, Delila A, NP  blood glucose meter kit and supplies Dispense based on patient and insurance preference. Use up to four times daily as directed. (FOR ICD-10 E10.9, E11.9). 12/20/22   Azucena Fallen, MD  clopidogrel (PLAVIX) 75 MG tablet Take 1 tablet (75 mg total) by mouth daily. 12/21/22   Azucena Fallen, MD  ezetimibe (ZETIA) 10 MG tablet Take 1 tablet (10 mg total) by mouth daily. 12/20/22   Azucena Fallen, MD  FARXIGA 5 MG TABS tablet Take 5 mg by mouth daily. 09/28/22   [provider]  gabapentin (NEURONTIN) 300 MG capsule Take 1 capsule (300 mg total) by mouth 3 (three) times daily. 03/09/23   Micki Riley, MD  glipiZIDE (GLUCOTROL) 5 MG tablet Take 1 tablet (5 mg total) by mouth daily before breakfast. 06/20/22 03/09/23  Bailey-Modzik, Jillene Bucks A, NP  HYDROcodone-acetaminophen (NORCO/VICODIN) 5-325 MG tablet Take 2 tablets by mouth every 4 (four) hours as needed. 01/09/23   Dianelly Ferran, MD  lisinopril (ZESTRIL) 20 MG tablet Take 1 tablet (20 mg total) by mouth daily. 12/21/22   Azucena Fallen, MD  metoprolol tartrate (LOPRESSOR) 50 MG tablet Take 50 mg by mouth 2 (two) times daily. 01/24/23   [provider]  ondansetron (ZOFRAN) 4 MG tablet Take 1 tablet (4 mg total) by mouth every 8 (eight) hours as needed for nausea or vomiting. 11/01/22   Terrilee Files, MD  oxyCODONE-acetaminophen (PERCOCET) 10-325 MG tablet Take 1  tablet by mouth every 4 (four) hours as needed for pain.    [provider]  oxyCODONE-acetaminophen (PERCOCET) 7.5-325 MG tablet Take 1 tablet by mouth every 4 (four) hours as needed for moderate pain. 12/02/22   [provider]  pantoprazole (PROTONIX) 40 MG tablet Take 1 tablet (40 mg total) by mouth daily. 11/01/22   Terrilee Files, MD  promethazine (PHENERGAN) 25 MG tablet Take 25 mg by mouth every 6 (six) hours as needed for nausea or vomiting. 11/10/22   [provider]  tiZANidine  (ZANAFLEX) 4 MG tablet Take 4 mg by mouth 3 (three) times daily as needed for muscle spasms. 12/17/22   [provider]  TUMS E-X 750 750 MG chewable tablet Chew 1 tablet by mouth 2 (two) times daily as needed for heartburn.    [provider]                                                                                                                                    Past Surgical History History reviewed. No pertinent surgical history. Family History Family History  Problem Relation Age of Onset   Cancer Mother    Stroke Father    Stroke Paternal Uncle    Heart attack Paternal Uncle     Social History Social History   Tobacco Use   Smoking status: Never   Smokeless tobacco: Former    Types: Engineer, drilling   Vaping status: Never Used  Substance Use Topics   Alcohol use: Not Currently    Comment: occasionally   Drug use: No   Allergies Patient has no known allergies.  Review of Systems Review of Systems  Neurological:  Positive for weakness, numbness and headaches.    Physical Exam Vital Signs  I have reviewed the triage vital signs BP (!) 176/115   Pulse 88   Temp (!) 97.4 F (36.3 C) (Oral)   Resp 18   Ht 5\' 8"  (1.727 m)   Wt 82.7 kg   SpO2 95%   BMI 27.72 kg/m   Physical Exam Constitutional:      General: He is not in acute distress.    Appearance: Normal appearance.  HENT:     Head: Normocephalic and atraumatic.     Nose: No congestion or rhinorrhea.  Eyes:     General:        Right eye: No discharge.        Left eye: No discharge.     Extraocular Movements: Extraocular movements intact.     Pupils: Pupils are equal, round, and reactive to light.  Cardiovascular:     Rate and Rhythm: Normal rate and regular rhythm.     Heart sounds: No murmur heard. Pulmonary:     Effort: No respiratory distress.     Breath sounds: No wheezing or rales.  Abdominal:     General: There is no distension.  Tenderness: There is no  abdominal tenderness.  Musculoskeletal:        General: Normal range of motion.     Cervical back: Normal range of motion.  Skin:    General: Skin is warm and dry.  Neurological:     General: No focal deficit present.     Mental Status: He is alert.     Cranial Nerves: Cranial nerve deficit present.     Sensory: Sensory deficit present.     Motor: Weakness present.     ED Results and Treatments Labs (all labs ordered are listed, but only abnormal results are displayed) Labs Reviewed  APTT - Abnormal; Notable for the following components:      Result Value   aPTT 23 (*)    All other components within normal limits  CBC - Abnormal; Notable for the following components:   Hemoglobin 18.1 (*)    All other components within normal limits  COMPREHENSIVE METABOLIC PANEL - Abnormal; Notable for the following components:   Glucose, Bld 254 (*)    BUN <5 (*)    All other components within normal limits  I-STAT CHEM 8, ED - Abnormal; Notable for the following components:   BUN 5 (*)    Glucose, Bld 257 (*)    Hemoglobin 18.0 (*)    HCT 53.0 (*)    All other components within normal limits  PROTIME-INR  DIFFERENTIAL  ETHANOL  CBG MONITORING, ED                                                                                                                          Radiology No results found.  Pertinent labs & imaging results that were available during my care of the patient were reviewed by me and considered in my medical decision making (see MDM for details).  Medications Ordered in ED Medications  prochlorperazine (COMPAZINE) injection 10 mg (has no administration in time range)  diphenhydrAMINE (BENADRYL) injection 25 mg (has no administration in time range)  lactated ringers bolus 1,000 mL (has no administration in time range)  sodium chloride flush (NS) 0.9 % injection 3 mL (3 mLs Intravenous Given 06/25/23 1802)                                                                                                                                      Procedures Procedures  (including critical  care time)  Medical Decision Making / ED Course   This patient presents to the ED for concern of numbness, weakness, this involves an extensive number of treatment options, and is a complaint that carries with it a high risk of complications and morbidity.  The differential diagnosis includes CVA, recrudescence, mass, electrolyte abnormality, Todd's paralysis, seizure  MDM: Patient seen emerged part for evaluation of weakness and numbness.  Physical exam with right upper and right lower extremity weakness, facial numbness on the right, upper and lower extremity numbness on the right.  Laboratory evaluation is largely unremarkable.  CT head unremarkable.  I spoke with the neurologist on-call Dr. Derry Lory who is recommending MRI which is pending at time of signout.  Patient did go to MRI but was unable to tolerate lying flat due to back pain.  At time of signout, pain has been controlled and he is back in MRI.  Please see provider signout for continuation of workup.   Additional history obtained: -Additional history obtained from son -External records from outside source obtained and reviewed including: Chart review including previous notes, labs, imaging, consultation notes   Lab Tests: -I ordered, reviewed, and interpreted labs.   The pertinent results include:   Labs Reviewed  APTT - Abnormal; Notable for the following components:      Result Value   aPTT 23 (*)    All other components within normal limits  CBC - Abnormal; Notable for the following components:   Hemoglobin 18.1 (*)    All other components within normal limits  COMPREHENSIVE METABOLIC PANEL - Abnormal; Notable for the following components:   Glucose, Bld 254 (*)    BUN <5 (*)    All other components within normal limits  I-STAT CHEM 8, ED - Abnormal; Notable for the following components:   BUN 5 (*)     Glucose, Bld 257 (*)    Hemoglobin 18.0 (*)    HCT 53.0 (*)    All other components within normal limits  PROTIME-INR  DIFFERENTIAL  ETHANOL  CBG MONITORING, ED      EKG   EKG Interpretation Date/Time:  Sunday June 25 2023 16:31:41 EDT Ventricular Rate:  83 PR Interval:  168 QRS Duration:  88 QT Interval:  386 QTC Calculation: 453 R Axis:   -16  Text Interpretation: Normal sinus rhythm Inferior infarct , age undetermined When compared with ECG of 26-Feb-2023 17:51, PREVIOUS ECG IS PRESENT Confirmed by Amador Braddy ([id number]) on 06/25/2023 11:08:27 PM         Imaging Studies ordered: I ordered imaging studies including CT head I independently visualized and interpreted imaging. I agree with the radiologist interpretation  MRI L-spine is pending   Medicines ordered and prescription drug management: Meds ordered this encounter  Medications   sodium chloride flush (NS) 0.9 % injection 3 mL   prochlorperazine (COMPAZINE) injection 10 mg   diphenhydrAMINE (BENADRYL) injection 25 mg   lactated ringers bolus 1,000 mL    -I have reviewed the patients home medicines and have made adjustments as needed  Critical interventions none  Consultations Obtained: I requested consultation with the neurologist on-call Dr. Derry Lory,  and discussed lab and imaging findings as well as pertinent plan - they recommend: MRI   Cardiac Monitoring: The patient was maintained on a cardiac monitor.  I personally viewed and interpreted the cardiac monitored which showed an underlying rhythm of: NSR  Social Determinants of Health:  Factors impacting patients care include: none   Reevaluation: After  the interventions noted above, I reevaluated the patient and found that they have :stayed the same  Co morbidities that complicate the patient evaluation  Past Medical History:  Diagnosis Date   Back injury    Chronic back pain    Diabetes mellitus without complication (HCC)     Diverticulosis    Hypertension    Stroke [facility])       Dispostion: I considered admission for this patient, and disposition pending completion of MRI imaging.  See provider signout note for continuation of workup     Final Clinical Impression(s) / ED Diagnoses Final diagnoses:  None     @PCDICTATION @    Frank Novelo, Wyn Forster, MD 06/25/23 2309    Glendora Score, MD 06/25/23 2310

## 2023-06-25 NOTE — Progress Notes (Signed)
Attempted pt's MRI. Pt moving whole body continuously due to back pain per pt. Offered several measures to help. Pt says you can "put me to sleep" otherwise "nothing you do will work, I can't do it". Again offered available measures but pt then refused to continue saying he "couldn't lay on this table any longer". Offered scanning pt on his side but pt still refused. Sent back to room.

## 2023-06-26 DIAGNOSIS — G890 Central pain syndrome: Secondary | ICD-10-CM

## 2023-06-26 MED ORDER — LAMOTRIGINE 25 MG PO TABS: 25.0000 mg | ORAL_TABLET | Freq: Every day | ORAL | 0 refills | Status: DC

## 2023-06-26 MED ORDER — LAMOTRIGINE 25 MG PO TABS: 25.0000 mg | ORAL_TABLET | Freq: Every day | ORAL | 0 refills | Status: AC
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
## 2023-06-26 NOTE — ED Provider Notes (Signed)
Care assumed from Dr. Posey Rea.  Patient with previous left-sided stroke here with new weakness to right arm and leg with numbness and tingling.  He is awaiting MRI to assess for new infarct.  MRI today does not show any new acute infarct.  Does show chronic infarcts.  Discussed with Dr. Iver Nestle of neurology. She suspects his symptoms are either related to recrudescence of his old symptoms versus worsening in the setting of worsening chronic pain and inadequate pain control   He is already on aspirin and Plavix.  Even if he did have an MRI negative stroke there would be no change in his current regimen.  Does not recommend any further neurological evaluation at this time  Dr. Iver Nestle has seen patient.  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 She does not feel he needs further neurological workup.  Will initiate lamotrigine instead Lamotrigine tritration Morning Night Week 1 and 2 none 25 mg  Week 3 and 4 25 mg 25 mg Week 5 25 mg 50 mg Week 6 50 mg 75 mg Week 7 75 mg 100 mg Week 8 100 mg 125 mg   Patient started on lamotrigine 25 mg daily and then 25 mg twice daily.  Discussed with patient he will need to follow-up with his neurologist for further up titration of this medication.  Is important he makes neurology appointment prior to running out of this medication.   Glynn Octave, MD 06/26/23 [phone]

## 2023-06-26 NOTE — Discharge Instructions (Signed)
There is no evidence of a new stroke.  As we discussed initiate Lamictal 25 mg at nighttime for 2 weeks then 25 mg twice daily for 2 weeks.  This will need to be increased further by your neurologist.  Is important to make a neurology appointment within the next month to initiate his medication changes. Follow-up with your neurologist.  Return to the ED with new or worsening symptoms.

## 2023-07-31 ENCOUNTER — Emergency Department (HOSPITAL_COMMUNITY): Payer: Medicaid Other

## 2023-07-31 ENCOUNTER — Emergency Department (HOSPITAL_COMMUNITY)
Admission: EM | Admit: 2023-07-31 | Discharge: 2023-07-31 | Disposition: A | Discharge status: Home / Self Care | Payer: Medicaid Other | Attending: Emergency Medicine | Admitting: Emergency Medicine

## 2023-07-31 ENCOUNTER — Encounter (HOSPITAL_COMMUNITY): Payer: Self-pay

## 2023-07-31 ENCOUNTER — Other Ambulatory Visit: Payer: Self-pay

## 2023-07-31 DIAGNOSIS — Z7902 Long term (current) use of antithrombotics/antiplatelets: Secondary | ICD-10-CM | POA: Diagnosis not present

## 2023-07-31 DIAGNOSIS — I10 Essential (primary) hypertension: Secondary | ICD-10-CM | POA: Insufficient documentation

## 2023-07-31 DIAGNOSIS — Z760 Encounter for issue of repeat prescription: Secondary | ICD-10-CM | POA: Diagnosis not present

## 2023-07-31 DIAGNOSIS — M5442 Lumbago with sciatica, left side: Secondary | ICD-10-CM | POA: Diagnosis not present

## 2023-07-31 DIAGNOSIS — E871 Hypo-osmolality and hyponatremia: Secondary | ICD-10-CM | POA: Diagnosis not present

## 2023-07-31 DIAGNOSIS — D72829 Elevated white blood cell count, unspecified: Secondary | ICD-10-CM | POA: Insufficient documentation

## 2023-07-31 DIAGNOSIS — M545 Low back pain, unspecified: Secondary | ICD-10-CM | POA: Diagnosis present

## 2023-07-31 DIAGNOSIS — Z79899 Other long term (current) drug therapy: Secondary | ICD-10-CM | POA: Insufficient documentation

## 2023-07-31 DIAGNOSIS — Z7982 Long term (current) use of aspirin: Secondary | ICD-10-CM | POA: Insufficient documentation

## 2023-07-31 LAB — COMPREHENSIVE METABOLIC PANEL
ALT: 23 U/L (ref 0–44)
AST: 22 U/L (ref 15–41)
Albumin: 3.9 g/dL (ref 3.5–5.0)
Alkaline Phosphatase: 52 U/L (ref 38–126)
Anion gap: 14 (ref 5–15)
BUN: 10 mg/dL (ref 6–20)
CO2: 21 mmol/L — ABNORMAL LOW (ref 22–32)
Calcium: 9.2 mg/dL (ref 8.9–10.3)
Chloride: 95 mmol/L — ABNORMAL LOW (ref 98–111)
Creatinine, Ser: 0.71 mg/dL (ref 0.61–1.24)
GFR, Estimated: >60 mL/min (ref >60)
Glucose, Bld: 167 mg/dL — ABNORMAL HIGH (ref 70–99)
Potassium: 3.8 mmol/L (ref 3.5–5.1)
Sodium: 130 mmol/L — ABNORMAL LOW (ref 135–145)
Total Bilirubin: 2.1 mg/dL — ABNORMAL HIGH (ref 0.3–1.2)
Total Protein: 7 g/dL (ref 6.5–8.1)

## 2023-07-31 LAB — I-STAT CHEM 8, ED
BUN: 10 mg/dL (ref 6–20)
Calcium, Ion: 1.1 mmol/L — ABNORMAL LOW (ref 1.15–1.40)
Chloride: 98 mmol/L (ref 98–111)
Creatinine, Ser: 0.6 mg/dL — ABNORMAL LOW (ref 0.61–1.24)
Glucose, Bld: 170 mg/dL — ABNORMAL HIGH (ref 70–99)
HCT: 53 % — ABNORMAL HIGH (ref 39.0–52.0)
Hemoglobin: 18 g/dL — ABNORMAL HIGH (ref 13.0–17.0)
Potassium: 3.9 mmol/L (ref 3.5–5.1)
Sodium: 131 mmol/L — ABNORMAL LOW (ref 135–145)
TCO2: 21 mmol/L — ABNORMAL LOW (ref 22–32)

## 2023-07-31 LAB — CBC WITH DIFFERENTIAL/PLATELET
Abs Immature Granulocytes: 0.03 10*3/uL (ref 0.00–0.07)
Basophils Absolute: 0.1 10*3/uL (ref 0.0–0.1)
Basophils Relative: 1 %
Eosinophils Absolute: 0.5 10*3/uL (ref 0.0–0.5)
Eosinophils Relative: 4 %
HCT: 51.5 % (ref 39.0–52.0)
Hemoglobin: 18.4 g/dL — ABNORMAL HIGH (ref 13.0–17.0)
Immature Granulocytes: 0 %
Lymphocytes Relative: 19 %
Lymphs Abs: 2.1 10*3/uL (ref 0.7–4.0)
MCH: 32.2 pg (ref 26.0–34.0)
MCHC: 35.7 g/dL (ref 30.0–36.0)
MCV: 90.2 fL (ref 80.0–100.0)
Monocytes Absolute: 0.6 10*3/uL (ref 0.1–1.0)
Monocytes Relative: 5 %
Neutro Abs: 7.7 10*3/uL (ref 1.7–7.7)
Neutrophils Relative %: 71 %
Platelets: 253 10*3/uL (ref 150–400)
RBC: 5.71 MIL/uL (ref 4.22–5.81)
RDW: 12.2 % (ref 11.5–15.5)
WBC: 10.9 10*3/uL — ABNORMAL HIGH (ref 4.0–10.5)
nRBC: 0 % (ref 0.0–0.2)

## 2023-07-31 LAB — URINALYSIS, ROUTINE W REFLEX MICROSCOPIC
Bilirubin Urine: NEGATIVE
Glucose, UA: NEGATIVE mg/dL
Hgb urine dipstick: NEGATIVE
Ketones, ur: 20 mg/dL — AB
Leukocytes,Ua: NEGATIVE
Nitrite: NEGATIVE
Protein, ur: NEGATIVE mg/dL
Specific Gravity, Urine: 1.012 (ref 1.005–1.030)
pH: 5 (ref 5.0–8.0)

## 2023-07-31 LAB — TROPONIN I (HIGH SENSITIVITY)
Troponin I (High Sensitivity): 8 ng/L (ref <18)
Troponin I (High Sensitivity): 5 ng/L (ref <18)

## 2023-07-31 MED ORDER — ONDANSETRON HCL 4 MG/2ML IJ SOLN
4.0000 mg | Freq: Once | INTRAMUSCULAR | Status: AC
  Administered 2023-07-31: 4 mg via INTRAVENOUS
  Filled 2023-07-31: qty 2

## 2023-07-31 MED ORDER — HYDROMORPHONE HCL 1 MG/ML IJ SOLN
1.0000 mg | Freq: Once | INTRAMUSCULAR | Status: AC
  Administered 2023-07-31: 1 mg via INTRAVENOUS
  Filled 2023-07-31: qty 1

## 2023-07-31 MED ORDER — TIZANIDINE HCL 4 MG PO TABS: 4.0000 mg | ORAL_TABLET | Freq: Three times a day (TID) | ORAL | 0 refills | Status: DC | PRN

## 2023-07-31 MED ORDER — HYDROMORPHONE HCL 1 MG/ML IJ SOLN
0.5000 mg | Freq: Once | INTRAMUSCULAR | Status: AC
  Administered 2023-07-31: 0.5 mg via INTRAVENOUS
  Filled 2023-07-31: qty 1

## 2023-07-31 MED ORDER — DEXAMETHASONE SODIUM PHOSPHATE 10 MG/ML IJ SOLN
10.0000 mg | Freq: Once | INTRAMUSCULAR | Status: AC
  Administered 2023-07-31: 10 mg via INTRAVENOUS
  Filled 2023-07-31: qty 1

## 2023-07-31 MED ORDER — AMLODIPINE BESYLATE 10 MG PO TABS: 10.0000 mg | ORAL_TABLET | Freq: Every day | ORAL | 0 refills | Status: AC

## 2023-07-31 MED ORDER — PREDNISONE 10 MG (21) PO TBPK: ORAL_TABLET | Freq: Every day | ORAL | 0 refills | Status: DC

## 2023-07-31 MED ORDER — GADOBUTROL 1 MMOL/ML IV SOLN
8.0000 mL | Freq: Once | INTRAVENOUS | Status: AC | PRN
  Administered 2023-07-31: 8 mL via INTRAVENOUS

## 2023-07-31 MED ORDER — LIDOCAINE 5 % EX PTCH: 1.0000 | MEDICATED_PATCH | CUTANEOUS | 0 refills | Status: DC

## 2023-07-31 MED ORDER — LISINOPRIL 20 MG PO TABS
20.0000 mg | ORAL_TABLET | Freq: Once | ORAL | Status: AC
  Administered 2023-07-31: 20 mg via ORAL
  Filled 2023-07-31: qty 1

## 2023-07-31 MED ORDER — DEXAMETHASONE SODIUM PHOSPHATE 10 MG/ML IJ SOLN: 12.0000 mg | Freq: Once | INTRAMUSCULAR | Status: DC

## 2023-07-31 MED ORDER — AMLODIPINE BESYLATE 5 MG PO TABS
10.0000 mg | ORAL_TABLET | Freq: Once | ORAL | Status: AC
  Administered 2023-07-31: 10 mg via ORAL
  Filled 2023-07-31: qty 2

## 2023-07-31 MED ORDER — OXYCODONE-ACETAMINOPHEN 10-325 MG PO TABS: 1.0000 | ORAL_TABLET | Freq: Four times a day (QID) | ORAL | 0 refills | Status: AC | PRN

## 2023-07-31 MED ORDER — IOHEXOL 350 MG/ML SOLN
100.0000 mL | Freq: Once | INTRAVENOUS | Status: AC | PRN
  Administered 2023-07-31: 100 mL via INTRAVENOUS

## 2023-07-31 MED ORDER — KETOROLAC TROMETHAMINE 30 MG/ML IJ SOLN
30.0000 mg | Freq: Once | INTRAMUSCULAR | Status: AC
  Administered 2023-07-31: 30 mg via INTRAVENOUS
  Filled 2023-07-31: qty 1

## 2023-07-31 NOTE — Discharge Instructions (Signed)
I have refilled your pain medication and muscle written you for a course of steroids and muscle relaxers.  I would recommend following up with a neurosurgeon for your back pain.  The nerves number listed to 1 your discharge paperwork.  Call to schedule an appointment.  Also refilled your blood pressure medication.  Follow-up with your primary care provider  Return for new or worsening symptoms.
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
## 2023-07-31 NOTE — ED Triage Notes (Signed)
Pt c/o mid lower back and it radiates down both hip and both legsx3d. Pt c/o numbness and tingling in right leg. Pt denies urinary issues. Pt denies loss of bowel or bladder.

## 2023-08-23 ENCOUNTER — Other Ambulatory Visit: Payer: Self-pay

## 2023-08-23 ENCOUNTER — Encounter (HOSPITAL_COMMUNITY): Payer: Self-pay | Admitting: Emergency Medicine

## 2023-08-23 ENCOUNTER — Emergency Department (HOSPITAL_COMMUNITY)
Admission: EM | Admit: 2023-08-23 | Discharge: 2023-08-24 | Discharge status: Against Medical Advice | Payer: Medicaid Other | Attending: Emergency Medicine | Admitting: Emergency Medicine

## 2023-08-23 DIAGNOSIS — R531 Weakness: Secondary | ICD-10-CM | POA: Insufficient documentation

## 2023-08-23 DIAGNOSIS — M549 Dorsalgia, unspecified: Secondary | ICD-10-CM | POA: Diagnosis present

## 2023-08-23 DIAGNOSIS — R200 Anesthesia of skin: Secondary | ICD-10-CM | POA: Diagnosis not present

## 2023-08-23 DIAGNOSIS — R222 Localized swelling, mass and lump, trunk: Secondary | ICD-10-CM | POA: Diagnosis not present

## 2023-08-23 DIAGNOSIS — Z5321 Procedure and treatment not carried out due to patient leaving prior to being seen by health care provider: Secondary | ICD-10-CM | POA: Insufficient documentation
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
## 2023-08-23 NOTE — ED Provider Triage Note (Signed)
Emergency Medicine Provider Triage Evaluation Note  Theodore Hinton , a 57 y.o. male  was evaluated in triage.  Pt complains of back pain, left leg numbness and weakness.  Has history of CVA with right-sided deficits.  States the left leg numbness and weakness is new.  Called his pain management physician yesterday but ended up missing the appointment.  States oxycodone 10 mg does not help.  Review of Systems  Positive: As above Negative: As above  Physical Exam  BP (!) 143/98   Pulse 79   Temp 98.2 F (36.8 C)   Resp 18   Wt 83 kg   SpO2 100%   BMI 27.82 kg/m  Gen:   Awake, no distress   Resp:  Normal effort  MSK:   Moves extremities without difficulty, ambulatory Other:    Medical Decision Making  Medically screening exam initiated at 8:00 PM.  Appropriate orders placed.  Tommas Olp was informed that the remainder of the evaluation will be completed by another provider, this initial triage assessment does not replace that evaluation, and the importance of remaining in the ED until their evaluation is complete.     Michelle Piper, Cordelia Poche 08/23/23 2001

## 2023-08-24 NOTE — ED Notes (Signed)
Pt called for VS, no response. Presumed LWBS.

## 2023-08-29 DIAGNOSIS — M5416 Radiculopathy, lumbar region: Secondary | ICD-10-CM | POA: Insufficient documentation

## 2023-10-01 DIAGNOSIS — G8929 Other chronic pain: Secondary | ICD-10-CM | POA: Insufficient documentation

## 2023-10-02 DIAGNOSIS — E785 Hyperlipidemia, unspecified: Secondary | ICD-10-CM | POA: Insufficient documentation

## 2023-10-02 DIAGNOSIS — Z8673 Personal history of transient ischemic attack (TIA), and cerebral infarction without residual deficits: Secondary | ICD-10-CM | POA: Insufficient documentation

## 2023-12-07 ENCOUNTER — Emergency Department (HOSPITAL_COMMUNITY)
Admission: EM | Admit: 2023-12-07 | Discharge: 2023-12-07 | Discharge status: Against Medical Advice | Payer: Medicaid Other | Attending: Emergency Medicine | Admitting: Emergency Medicine

## 2023-12-07 ENCOUNTER — Emergency Department (HOSPITAL_COMMUNITY): Payer: Medicaid Other

## 2023-12-07 ENCOUNTER — Encounter (HOSPITAL_COMMUNITY): Payer: Self-pay

## 2023-12-07 ENCOUNTER — Other Ambulatory Visit: Payer: Self-pay

## 2023-12-07 DIAGNOSIS — H538 Other visual disturbances: Secondary | ICD-10-CM | POA: Insufficient documentation

## 2023-12-07 DIAGNOSIS — Z8673 Personal history of transient ischemic attack (TIA), and cerebral infarction without residual deficits: Secondary | ICD-10-CM | POA: Diagnosis not present

## 2023-12-07 DIAGNOSIS — R519 Headache, unspecified: Secondary | ICD-10-CM | POA: Insufficient documentation

## 2023-12-07 DIAGNOSIS — R200 Anesthesia of skin: Secondary | ICD-10-CM | POA: Insufficient documentation

## 2023-12-07 DIAGNOSIS — Z5321 Procedure and treatment not carried out due to patient leaving prior to being seen by health care provider: Secondary | ICD-10-CM | POA: Insufficient documentation

## 2023-12-07 LAB — CBC
HCT: 47.2 % (ref 39.0–52.0)
Hemoglobin: 16.6 g/dL (ref 13.0–17.0)
MCH: 31.4 pg (ref 26.0–34.0)
MCHC: 35.2 g/dL (ref 30.0–36.0)
MCV: 89.4 fL (ref 80.0–100.0)
Platelets: 277 10*3/uL (ref 150–400)
RBC: 5.28 MIL/uL (ref 4.22–5.81)
RDW: 12.4 % (ref 11.5–15.5)
WBC: 11.6 10*3/uL — ABNORMAL HIGH (ref 4.0–10.5)
nRBC: 0 % (ref 0.0–0.2)

## 2023-12-07 LAB — DIFFERENTIAL
Abs Immature Granulocytes: 0.04 10*3/uL (ref 0.00–0.07)
Basophils Absolute: 0.1 10*3/uL (ref 0.0–0.1)
Basophils Relative: 1 %
Eosinophils Absolute: 0.6 10*3/uL — ABNORMAL HIGH (ref 0.0–0.5)
Eosinophils Relative: 6 %
Immature Granulocytes: 0 %
Lymphocytes Relative: 22 %
Lymphs Abs: 2.5 10*3/uL (ref 0.7–4.0)
Monocytes Absolute: 0.7 10*3/uL (ref 0.1–1.0)
Monocytes Relative: 6 %
Neutro Abs: 7.6 10*3/uL (ref 1.7–7.7)
Neutrophils Relative %: 65 %

## 2023-12-07 LAB — RAPID URINE DRUG SCREEN, HOSP PERFORMED
Amphetamines: NOT DETECTED
Barbiturates: NOT DETECTED
Benzodiazepines: NOT DETECTED
Cocaine: NOT DETECTED
Opiates: NOT DETECTED
Tetrahydrocannabinol: NOT DETECTED

## 2023-12-07 LAB — I-STAT CHEM 8, ED
BUN: 8 mg/dL (ref 6–20)
Calcium, Ion: 1.19 mmol/L (ref 1.15–1.40)
Chloride: 105 mmol/L (ref 98–111)
Creatinine, Ser: 0.8 mg/dL (ref 0.61–1.24)
Glucose, Bld: 234 mg/dL — ABNORMAL HIGH (ref 70–99)
HCT: 49 % (ref 39.0–52.0)
Hemoglobin: 16.7 g/dL (ref 13.0–17.0)
Potassium: 3.6 mmol/L (ref 3.5–5.1)
Sodium: 140 mmol/L (ref 135–145)
TCO2: 21 mmol/L — ABNORMAL LOW (ref 22–32)

## 2023-12-07 LAB — COMPREHENSIVE METABOLIC PANEL
ALT: 15 U/L (ref 0–44)
AST: 17 U/L (ref 15–41)
Albumin: 3.7 g/dL (ref 3.5–5.0)
Alkaline Phosphatase: 54 U/L (ref 38–126)
Anion gap: 5 (ref 5–15)
BUN: 6 mg/dL (ref 6–20)
CO2: 25 mmol/L (ref 22–32)
Calcium: 9.2 mg/dL (ref 8.9–10.3)
Chloride: 106 mmol/L (ref 98–111)
Creatinine, Ser: 0.83 mg/dL (ref 0.61–1.24)
GFR, Estimated: >60 mL/min (ref >60)
Glucose, Bld: 243 mg/dL — ABNORMAL HIGH (ref 70–99)
Potassium: 3.5 mmol/L (ref 3.5–5.1)
Sodium: 136 mmol/L (ref 135–145)
Total Bilirubin: 1.1 mg/dL (ref 0.0–1.2)
Total Protein: 6.6 g/dL (ref 6.5–8.1)

## 2023-12-07 LAB — URINALYSIS, ROUTINE W REFLEX MICROSCOPIC
Bacteria, UA: NONE SEEN
Bilirubin Urine: NEGATIVE
Glucose, UA: 150 mg/dL — AB
Hgb urine dipstick: NEGATIVE
Ketones, ur: NEGATIVE mg/dL
Leukocytes,Ua: NEGATIVE
Nitrite: NEGATIVE
Protein, ur: NEGATIVE mg/dL
Specific Gravity, Urine: >1.046 — ABNORMAL HIGH (ref 1.005–1.030)
pH: 7 (ref 5.0–8.0)

## 2023-12-07 LAB — PROTIME-INR
INR: 1 (ref 0.8–1.2)
Prothrombin Time: 13.4 s (ref 11.4–15.2)

## 2023-12-07 LAB — APTT: aPTT: 25 s (ref 24–36)

## 2023-12-07 LAB — ETHANOL: Alcohol, Ethyl (B): <10 mg/dL (ref <10)

## 2023-12-07 MED ORDER — IOHEXOL 350 MG/ML SOLN
75.0000 mL | Freq: Once | INTRAVENOUS | Status: AC | PRN
  Administered 2023-12-07: 75 mL via INTRAVENOUS

## 2023-12-07 NOTE — ED Provider Triage Note (Signed)
 Emergency Medicine Provider Triage Evaluation Note  Kol Consuegra , a 58 y.o. male  was evaluated in triage.  Pt complains of stroke like symptoms.  LKW 2 days ago per wife.  Today mostly had worsening symptoms including left sided headache, tongue numbness causing slurred speech, blurred vision while watching TV.  Hx of prior stroke with residual right sided deficits.  Had back surgery a few months ago, taken off eliquis and ASA then, never re-started them.  Review of Systems  Positive: Stroke symptoms-- headache, slurred speech, tongue numbness Negative: fever  Physical Exam  BP (!) 174/107 (BP Location: Right Arm)   Pulse 88   Temp 98.4 F (36.9 C) (Oral)   Resp 15   SpO2 99%   Gen:   Awake, no distress   Resp:  Normal effort  MSK:   Moves extremities without difficulty  Other:  AAOx3, moving all extremities well, speech sounds quite clear when speaking with me  Medical Decision Making  Medically screening exam initiated at 12:59 AM.  Appropriate orders placed.  [name] was informed that the remainder of the evaluation will be completed by another provider, this initial triage assessment does not replace that evaluation, and the importance of remaining in the ED until their evaluation is complete.  Stroke symptoms.  LKW 48 hours ago so not a code stroke candidate.  Will still proceed with stroke work-up.  CT head, CTA head/neck, labs, EKG.   Jarold Olam HERO, PA-C 12/07/23 [phone]

## 2023-12-07 NOTE — ED Triage Notes (Addendum)
 Pt BIB  [facility] d/t stroke like symptoms - pt states he had 3 CVAs last in Sept but symptoms are worse.  Pt's wife states his memory has been worse, his gait and right side more numb.  He has same deficits but they worse.  Pt c/o HA as well.  All s/s began 2 days ago.

## 2023-12-07 NOTE — ED Notes (Signed)
 Pt called x3, with no response. Moving him OTF.

## 2024-02-03 DIAGNOSIS — R112 Nausea with vomiting, unspecified: Secondary | ICD-10-CM | POA: Insufficient documentation

## 2024-02-03 DIAGNOSIS — R109 Unspecified abdominal pain: Secondary | ICD-10-CM | POA: Insufficient documentation

## 2024-02-28 ENCOUNTER — Encounter: Payer: Self-pay | Admitting: Gastroenterology

## 2024-04-24 ENCOUNTER — Ambulatory Visit: Admitting: Gastroenterology

## 2024-04-24 NOTE — Progress Notes (Deleted)
 Chief Complaint:colonoscopy, EGD Primary GI Doctor:***  HPI:  Patient is a  58  year old male patient with past medical history of DM, hypertension, prior CVA with right-sided deficits, chronic back pain, *****who was referred to me by Jabier Martens, on 02/05/24 for colonoscopy and EGD.  Interval History  Patient admits/denies GERD Patient admits/denies dysphagia Patient admits/denies nausea, vomiting, or weight loss  Patient admits/denies altered bowel habits Patient admits/denies abdominal pain Patient admits/denies rectal bleeding   Denies/Admits alcohol Denies/Admits smoking Denies/Admits NSAID use. Denies/Admits they are on blood thinners. Patient on Plavix  75 mg po daily and ASA 81 mg po daily  Patients last colonoscopy Patients last EGD  Patient's family history includes  Wt Readings from Last 3 Encounters:  12/07/23 182 lb 15.7 oz (83 kg)  08/23/23 182 lb 15.7 oz (83 kg)  07/31/23 182 lb 5.1 oz (82.7 kg)      Past Medical History:  Diagnosis Date   Back injury    Chronic back pain    Diabetes mellitus without complication (HCC)    Diverticulosis    Hypertension    Stroke (HCC)     No past surgical history on file.  Current Outpatient Medications  Medication Sig Dispense Refill   amLODipine  (NORVASC ) 10 MG tablet Take 1 tablet (10 mg total) by mouth daily. 30 tablet 0   aspirin  EC 81 MG tablet Take 1 tablet (81 mg total) by mouth daily. Swallow whole. 30 tablet 12   atorvastatin  (LIPITOR ) 80 MG tablet Take 1 tablet (80 mg total) by mouth daily. 30 tablet 2   blood glucose meter kit and supplies Dispense based on patient and insurance preference. Use up to four times daily as directed. (FOR ICD-10 E10.9, E11.9). 1 each 0   clopidogrel  (PLAVIX ) 75 MG tablet Take 1 tablet (75 mg total) by mouth daily. 20 tablet 0   ezetimibe  (ZETIA ) 10 MG tablet Take 1 tablet (10 mg total) by mouth daily. 30 tablet 0   FARXIGA 5 MG TABS tablet Take 5 mg by mouth  daily.     gabapentin  (NEURONTIN ) 300 MG capsule Take 1 capsule (300 mg total) by mouth 3 (three) times daily. 90 capsule 11   glipiZIDE  (GLUCOTROL ) 5 MG tablet Take 1 tablet (5 mg total) by mouth daily before breakfast. 30 tablet 2   lamoTRIgine  (LAMICTAL ) 25 MG tablet Take 1 tablet (25 mg total) by mouth daily. 60 tablet 0   lidocaine  (LIDODERM ) 5 % Place 1 patch onto the skin daily. Remove & Discard patch within 12 hours or as directed by MD 30 patch 0   lisinopril  (ZESTRIL ) 20 MG tablet Take 1 tablet (20 mg total) by mouth daily. 30 tablet 0   metoprolol  tartrate (LOPRESSOR ) 50 MG tablet Take 50 mg by mouth 2 (two) times daily.     ondansetron  (ZOFRAN ) 4 MG tablet Take 1 tablet (4 mg total) by mouth every 8 (eight) hours as needed for nausea or vomiting. 15 tablet 0   oxyCODONE -acetaminophen  (PERCOCET) 7.5-325 MG tablet Take 1 tablet by mouth every 4 (four) hours as needed for moderate pain.     predniSONE  (STERAPRED UNI-PAK 21 TAB) 10 MG (21) TBPK tablet Take by mouth daily. Take 6 tabs by mouth daily  for 1 days, then 5 tabs for 1 days, then 4 tabs for 1 days, then 3 tabs for 1 days, 2 tabs for 1 days, then 1 tab by mouth daily for 1 days 21 tablet 0   promethazine (PHENERGAN) 25  MG tablet Take 25 mg by mouth every 6 (six) hours as needed for nausea or vomiting.     tiZANidine  (ZANAFLEX ) 4 MG tablet Take 1 tablet (4 mg total) by mouth 3 (three) times daily as needed for muscle spasms. 30 tablet 0   TUMS E-X 750 750 MG chewable tablet Chew 1 tablet by mouth 2 (two) times daily as needed for heartburn.     No current facility-administered medications for this visit.    Allergies as of 04/24/2024   (No Known Allergies)    Family History  Problem Relation Age of Onset   Cancer Mother    Stroke Father    Stroke Paternal Uncle    Heart attack Paternal Uncle     Review of Systems:    Constitutional: No weight loss, fever, chills, weakness or fatigue HEENT: Eyes: No change in vision                Ears, Nose, Throat:  No change in hearing or congestion Skin: No rash or itching Cardiovascular: No chest pain, chest pressure or palpitations   Respiratory: No SOB or cough Gastrointestinal: See HPI and otherwise negative Genitourinary: No dysuria or change in urinary frequency Neurological: No headache, dizziness or syncope Musculoskeletal: No new muscle or joint pain Hematologic: No bleeding or bruising Psychiatric: No history of depression or anxiety    Physical Exam:  Vital signs: There were no vitals taken for this visit.  Constitutional:   Pleasant *** male appears to be in NAD, Well developed, Well nourished, alert and cooperative Head:  Normocephalic and atraumatic. Eyes:   PEERL, EOMI. No icterus. Conjunctiva pink. Ears:  Normal auditory acuity. Neck:  Supple Throat: Oral cavity and pharynx without inflammation, swelling or lesion.  Respiratory: Respirations even and unlabored. Lungs clear to auscultation bilaterally.   No wheezes, crackles, or rhonchi.  Cardiovascular: Normal S1, S2. Regular rate and rhythm. No peripheral edema, cyanosis or pallor.  Gastrointestinal:  Soft, nondistended, nontender. No rebound or guarding. Normal bowel sounds. No appreciable masses or hepatomegaly. Rectal:  Not performed.  Anoscopy: Msk:  Symmetrical without gross deformities. Without edema, no deformity or joint abnormality.  Neurologic:  Alert and  oriented x4;  grossly normal neurologically.  Skin:   Dry and intact without significant lesions or rashes. Psychiatric: Oriented to person, place and time. Demonstrates good judgement and reason without abnormal affect or behaviors.  RELEVANT LABS AND IMAGING: CBC    Latest Ref Rng & Units 12/07/2023    1:51 AM 12/07/2023    1:45 AM 07/31/2023    5:51 PM  CBC  WBC 4.0 - 10.5 K/uL  11.6    Hemoglobin 13.0 - 17.0 g/dL 16.1  09.6  04.5   Hematocrit 39.0 - 52.0 % 49.0  47.2  53.0   Platelets 150 - 400 K/uL  277       CMP      Latest Ref Rng & Units 12/07/2023    1:51 AM 12/07/2023    1:45 AM 07/31/2023    5:51 PM  CMP  Glucose 70 - 99 mg/dL 409  811  914   BUN 6 - 20 mg/dL 8  6  10    Creatinine 0.61 - 1.24 mg/dL 7.82  9.56  2.13   Sodium 135 - 145 mmol/L 140  136  131   Potassium 3.5 - 5.1 mmol/L 3.6  3.5  3.9   Chloride 98 - 111 mmol/L 105  106  98   CO2 22 -  32 mmol/L  25    Calcium  8.9 - 10.3 mg/dL  9.2    Total Protein 6.5 - 8.1 g/dL  6.6    Total Bilirubin 0.0 - 1.2 mg/dL  1.1    Alkaline Phos 38 - 126 U/L  54    AST 15 - 41 U/L  17    ALT 0 - 44 U/L  15     12/20/22 echo- Left ventricular ejection fraction, by estimation, is 65 to 70%   Assessment: 1. ***  Plan: 1. ***   Thank you for the courtesy of this consult. Please call me with any questions or concerns.   Emanual Lamountain, FNP-C [facility] 04/24/2024, 6:51 AM  Cc: [facility], [facility]

## 2024-06-24 ENCOUNTER — Telehealth: Payer: Self-pay | Admitting: Neurology

## 2024-06-24 NOTE — Telephone Encounter (Signed)
 Patient stated, had an appointment with  PCP this morning; was advised may have had a mini stroke would need to be seen by neurologist. Left leg would go out and would fall. Would like a call from the nurse.

## 2024-06-24 NOTE — Telephone Encounter (Signed)
 Called the pt back. Pt states this past weekend states his left leg started to give out on him. He states that he was falling and tripping. He noticed numbness in left side of face and tongue. States that face did droop. When he woke up on Sunday vision was a little fuzzy. He states today feels a little better but these symptoms were similar to the last stroke that he had. He went and saw PCP and they recommended he call us . I advised that since his symptoms have not fully resolved in order to get an idea on if there is something going on, he needs to go to ER and be evaluated. He verbalized understanding. I advised I would let the MD know as well. He was appreciative for the call back.

## 2024-07-03 ENCOUNTER — Inpatient Hospital Stay (HOSPITAL_COMMUNITY)
Admission: EM | Admit: 2024-07-03 | Discharge: 2024-07-11 | DRG: 472 | Disposition: A | Discharge status: Home / Self Care | Attending: Neurosurgery | Admitting: Neurosurgery

## 2024-07-03 ENCOUNTER — Emergency Department (HOSPITAL_COMMUNITY)

## 2024-07-03 ENCOUNTER — Encounter (HOSPITAL_COMMUNITY): Payer: Self-pay | Admitting: *Deleted

## 2024-07-03 ENCOUNTER — Other Ambulatory Visit: Payer: Self-pay

## 2024-07-03 DIAGNOSIS — Z87891 Personal history of nicotine dependence: Secondary | ICD-10-CM

## 2024-07-03 DIAGNOSIS — Z7984 Long term (current) use of oral hypoglycemic drugs: Secondary | ICD-10-CM

## 2024-07-03 DIAGNOSIS — G9520 Unspecified cord compression: Principal | ICD-10-CM

## 2024-07-03 DIAGNOSIS — E119 Type 2 diabetes mellitus without complications: Secondary | ICD-10-CM

## 2024-07-03 DIAGNOSIS — M4802 Spinal stenosis, cervical region: Secondary | ICD-10-CM | POA: Diagnosis present

## 2024-07-03 DIAGNOSIS — M4712 Other spondylosis with myelopathy, cervical region: Principal | ICD-10-CM | POA: Diagnosis present

## 2024-07-03 DIAGNOSIS — M21372 Foot drop, left foot: Secondary | ICD-10-CM | POA: Diagnosis present

## 2024-07-03 DIAGNOSIS — Z8249 Family history of ischemic heart disease and other diseases of the circulatory system: Secondary | ICD-10-CM

## 2024-07-03 DIAGNOSIS — M5416 Radiculopathy, lumbar region: Secondary | ICD-10-CM | POA: Diagnosis present

## 2024-07-03 DIAGNOSIS — Z79899 Other long term (current) drug therapy: Secondary | ICD-10-CM | POA: Diagnosis not present

## 2024-07-03 DIAGNOSIS — Z823 Family history of stroke: Secondary | ICD-10-CM

## 2024-07-03 DIAGNOSIS — R531 Weakness: Secondary | ICD-10-CM

## 2024-07-03 DIAGNOSIS — R296 Repeated falls: Secondary | ICD-10-CM | POA: Diagnosis present

## 2024-07-03 DIAGNOSIS — G959 Disease of spinal cord, unspecified: Secondary | ICD-10-CM | POA: Diagnosis present

## 2024-07-03 DIAGNOSIS — I10 Essential (primary) hypertension: Secondary | ICD-10-CM | POA: Diagnosis present

## 2024-07-03 DIAGNOSIS — W19XXXA Unspecified fall, initial encounter: Secondary | ICD-10-CM | POA: Diagnosis present

## 2024-07-03 DIAGNOSIS — Z91128 Patient's intentional underdosing of medication regimen for other reason: Secondary | ICD-10-CM

## 2024-07-03 DIAGNOSIS — T45526A Underdosing of antithrombotic drugs, initial encounter: Secondary | ICD-10-CM | POA: Diagnosis present

## 2024-07-03 DIAGNOSIS — I679 Cerebrovascular disease, unspecified: Secondary | ICD-10-CM | POA: Diagnosis not present

## 2024-07-03 DIAGNOSIS — I69351 Hemiplegia and hemiparesis following cerebral infarction affecting right dominant side: Secondary | ICD-10-CM

## 2024-07-03 DIAGNOSIS — E1165 Type 2 diabetes mellitus with hyperglycemia: Secondary | ICD-10-CM | POA: Diagnosis not present

## 2024-07-03 DIAGNOSIS — G8929 Other chronic pain: Secondary | ICD-10-CM | POA: Diagnosis present

## 2024-07-03 DIAGNOSIS — G992 Myelopathy in diseases classified elsewhere: Secondary | ICD-10-CM | POA: Diagnosis present

## 2024-07-03 LAB — COMPREHENSIVE METABOLIC PANEL WITH GFR
ALT: 16 U/L (ref 0–44)
AST: 25 U/L (ref 15–41)
Albumin: 3.4 g/dL — ABNORMAL LOW (ref 3.5–5.0)
Alkaline Phosphatase: 64 U/L (ref 38–126)
Anion gap: 10 (ref 5–15)
BUN: 7 mg/dL (ref 6–20)
CO2: 20 mmol/L — ABNORMAL LOW (ref 22–32)
Calcium: 8.9 mg/dL (ref 8.9–10.3)
Chloride: 107 mmol/L (ref 98–111)
Creatinine, Ser: 0.64 mg/dL (ref 0.61–1.24)
GFR, Estimated: >60 mL/min (ref >60)
Glucose, Bld: 163 mg/dL — ABNORMAL HIGH (ref 70–99)
Potassium: 4.2 mmol/L (ref 3.5–5.1)
Sodium: 137 mmol/L (ref 135–145)
Total Bilirubin: 1.9 mg/dL — ABNORMAL HIGH (ref 0.0–1.2)
Total Protein: 6.1 g/dL — ABNORMAL LOW (ref 6.5–8.1)

## 2024-07-03 LAB — DIFFERENTIAL
Abs Immature Granulocytes: 0.03 K/uL (ref 0.00–0.07)
Basophils Absolute: 0.1 K/uL (ref 0.0–0.1)
Basophils Relative: 1 %
Eosinophils Absolute: 0.3 K/uL (ref 0.0–0.5)
Eosinophils Relative: 4 %
Immature Granulocytes: 0 %
Lymphocytes Relative: 22 %
Lymphs Abs: 1.9 K/uL (ref 0.7–4.0)
Monocytes Absolute: 0.4 K/uL (ref 0.1–1.0)
Monocytes Relative: 5 %
Neutro Abs: 6 K/uL (ref 1.7–7.7)
Neutrophils Relative %: 68 %

## 2024-07-03 LAB — I-STAT CHEM 8, ED
BUN: 7 mg/dL (ref 6–20)
Calcium, Ion: 1.14 mmol/L — ABNORMAL LOW (ref 1.15–1.40)
Chloride: 106 mmol/L (ref 98–111)
Creatinine, Ser: 0.5 mg/dL — ABNORMAL LOW (ref 0.61–1.24)
Glucose, Bld: 166 mg/dL — ABNORMAL HIGH (ref 70–99)
HCT: 54 % — ABNORMAL HIGH (ref 39.0–52.0)
Hemoglobin: 18.4 g/dL — ABNORMAL HIGH (ref 13.0–17.0)
Potassium: 4 mmol/L (ref 3.5–5.1)
Sodium: 138 mmol/L (ref 135–145)
TCO2: 23 mmol/L (ref 22–32)

## 2024-07-03 LAB — PROTIME-INR
INR: 1 (ref 0.8–1.2)
Prothrombin Time: 13.9 s (ref 11.4–15.2)

## 2024-07-03 LAB — CBC
HCT: 52.3 % — ABNORMAL HIGH (ref 39.0–52.0)
Hemoglobin: 17.6 g/dL — ABNORMAL HIGH (ref 13.0–17.0)
MCH: 30.6 pg (ref 26.0–34.0)
MCHC: 33.7 g/dL (ref 30.0–36.0)
MCV: 91 fL (ref 80.0–100.0)
Platelets: 230 K/uL (ref 150–400)
RBC: 5.75 MIL/uL (ref 4.22–5.81)
RDW: 13.9 % (ref 11.5–15.5)
WBC: 8.8 K/uL (ref 4.0–10.5)
nRBC: 0 % (ref 0.0–0.2)

## 2024-07-03 LAB — APTT: aPTT: 26 s (ref 24–36)

## 2024-07-03 LAB — CBG MONITORING, ED: Glucose-Capillary: 181 mg/dL — ABNORMAL HIGH (ref 70–99)

## 2024-07-03 LAB — ETHANOL: Alcohol, Ethyl (B): <15 mg/dL (ref <15)

## 2024-07-03 MED ORDER — EZETIMIBE 10 MG PO TABS
10.0000 mg | ORAL_TABLET | Freq: Every day | ORAL | Status: DC
  Administered 2024-07-04 – 2024-07-11 (×8): 10 mg via ORAL
  Filled 2024-07-03 (×8): qty 1

## 2024-07-03 MED ORDER — ACETAMINOPHEN 650 MG RE SUPP: 650.0000 mg | RECTAL | Status: DC | PRN

## 2024-07-03 MED ORDER — LAMOTRIGINE 25 MG PO TABS
25.0000 mg | ORAL_TABLET | Freq: Every day | ORAL | Status: DC
  Administered 2024-07-04 – 2024-07-11 (×8): 25 mg via ORAL
  Filled 2024-07-03 (×8): qty 1

## 2024-07-03 MED ORDER — TIZANIDINE HCL 4 MG PO TABS
4.0000 mg | ORAL_TABLET | Freq: Three times a day (TID) | ORAL | Status: DC | PRN
  Administered 2024-07-05 – 2024-07-11 (×12): 4 mg via ORAL
  Filled 2024-07-03 (×12): qty 1

## 2024-07-03 MED ORDER — METOPROLOL TARTRATE 50 MG PO TABS
50.0000 mg | ORAL_TABLET | Freq: Two times a day (BID) | ORAL | Status: DC
  Administered 2024-07-04 – 2024-07-11 (×13): 50 mg via ORAL
  Filled 2024-07-03 (×8): qty 1
  Filled 2024-07-03: qty 2
  Filled 2024-07-03 (×6): qty 1

## 2024-07-03 MED ORDER — LISINOPRIL 20 MG PO TABS
20.0000 mg | ORAL_TABLET | Freq: Every day | ORAL | Status: DC
  Administered 2024-07-04 – 2024-07-05 (×2): 20 mg via ORAL
  Filled 2024-07-03 (×2): qty 1

## 2024-07-03 MED ORDER — ATORVASTATIN CALCIUM 80 MG PO TABS
80.0000 mg | ORAL_TABLET | Freq: Every day | ORAL | Status: DC
  Administered 2024-07-04 – 2024-07-11 (×8): 80 mg via ORAL
  Filled 2024-07-03 (×8): qty 1

## 2024-07-03 MED ORDER — HYDROCODONE-ACETAMINOPHEN 5-325 MG PO TABS
1.0000 | ORAL_TABLET | ORAL | Status: DC | PRN
  Administered 2024-07-03 – 2024-07-11 (×7): 1 via ORAL
  Filled 2024-07-03 (×8): qty 1

## 2024-07-03 MED ORDER — GABAPENTIN 300 MG PO CAPS
300.0000 mg | ORAL_CAPSULE | Freq: Three times a day (TID) | ORAL | Status: DC
  Administered 2024-07-03 – 2024-07-11 (×22): 300 mg via ORAL
  Filled 2024-07-03 (×22): qty 1

## 2024-07-03 MED ORDER — PANTOPRAZOLE SODIUM 40 MG IV SOLR
40.0000 mg | Freq: Every day | INTRAVENOUS | Status: DC
  Administered 2024-07-03 – 2024-07-05 (×3): 40 mg via INTRAVENOUS
  Filled 2024-07-03 (×3): qty 10

## 2024-07-03 MED ORDER — AMLODIPINE BESYLATE 5 MG PO TABS
10.0000 mg | ORAL_TABLET | Freq: Every day | ORAL | Status: DC
  Administered 2024-07-04 – 2024-07-11 (×8): 10 mg via ORAL
  Filled 2024-07-03 (×8): qty 2

## 2024-07-03 MED ORDER — OXYCODONE HCL 5 MG PO TABS
10.0000 mg | ORAL_TABLET | ORAL | Status: DC | PRN
  Administered 2024-07-04 – 2024-07-11 (×35): 10 mg via ORAL
  Filled 2024-07-03 (×35): qty 2

## 2024-07-03 MED ORDER — ACETAMINOPHEN 325 MG PO TABS
650.0000 mg | ORAL_TABLET | ORAL | Status: DC | PRN
Start: 2024-07-03 — End: 2024-07-11
  Administered 2024-07-05 – 2024-07-09 (×6): 650 mg via ORAL
  Filled 2024-07-03 (×6): qty 2

## 2024-07-03 MED ORDER — ONDANSETRON HCL 4 MG PO TABS
4.0000 mg | ORAL_TABLET | Freq: Three times a day (TID) | ORAL | Status: DC | PRN
  Administered 2024-07-10: 4 mg via ORAL
  Filled 2024-07-03: qty 1

## 2024-07-03 MED ORDER — INSULIN ASPART 100 UNIT/ML IJ SOLN
0.0000 [IU] | Freq: Three times a day (TID) | INTRAMUSCULAR | Status: DC
  Administered 2024-07-04 – 2024-07-05 (×2): 2 [IU] via SUBCUTANEOUS

## 2024-07-03 MED ORDER — CALCIUM CARBONATE ANTACID 500 MG PO CHEW: 1.5000 | CHEWABLE_TABLET | Freq: Two times a day (BID) | ORAL | Status: DC | PRN

## 2024-07-03 NOTE — Code Documentation (Signed)
 Stroke Response Nurse Documentation Code Documentation  Theodore Hinton is a 58 y.o. male arriving to [place]  via Private Vehicle on 7/30 with past medical hx of CVA, DM, HTN. On No antithrombotic. Code stroke was activated by ED.   Patient from home where he was LKW two weeks ago and now complaining of continued numbness and weakness in L leg, causing him to fall several times and prompting him to call his PCP who advised him to come to the ED for stroke workup.   Labs drawn and patient cleared for CT by EDP. Patient to CT with team. NIHSS 3, see documentation for details and code stroke times. Patient with left arm weakness, left leg weakness, and left decreased sensation on exam. The following imaging was completed:  CT Head. Patient is not a candidate for IV Thrombolytic due to LKW two weeks ago. Patient is not a candidate for IR due to LVO not suspected.   Care Plan: cancel.   Bedside handoff with ED RN Dena.    Lauraine LITTIE Searle  Stroke Response RN

## 2024-07-03 NOTE — ED Triage Notes (Addendum)
 3 falls in the past 3-4 days  strokes x 3  foot drop lt foot  c/o lt hip and lt foot pain

## 2024-07-03 NOTE — H&P (Signed)
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 signs/symptoms of acute sinusitis. Electronically Signed   By: Rockey Childs D.O.   On: 07/03/2024 16:55     Assessment/Plan: 58 year old male presented to the ED with progressive weakness in his upper extremities and legs.  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  I do think  most of his symptoms are likely coming from the moderate spinal stenosis at C4-5.  Patient does not feel safe to be discharged home with his frequent falls and weakness.  Therefore, we will admit him to the hospital therapy.  He will likely need an ACDF at least at C4-5.  However, will defer this decision to Dr. Louis tomorrow.  N.p.o. after midnight just in case.   Suzen Lacks Lihanna Biever 07/03/2024 10:07 PM

## 2024-07-03 NOTE — ED Notes (Signed)
 Pa called a code stroke

## 2024-07-03 NOTE — ED Provider Notes (Signed)
 [facility] Provider Note  CSN: [id number] Arrival date & time: 07/03/24 1538  Chief Complaint(s) Weakness and Fall  HPI Kobe Ofallon is a 58 y.o. male with PMH T2DM, previous CVA, chronic lower extremity weakness who presents Emergency Department for evaluation of worsening lower extremity weakness and numbness.  He states that over the last 2 weeks he has had progressive worsening weakness and increased number of falls.  States that he feels a general sensation of numbness from the neck down and his arms are now starting to get weak.  He had initially told ED triage providers that his symptoms have been going on for about 2 hours and arrives as a code stroke.   Past Medical History Past Medical History:  Diagnosis Date   Back injury    Chronic back pain    Diabetes mellitus without complication (HCC)    Diverticulosis    Hypertension    Stroke [facility])    Patient Active Problem List   Diagnosis Date Noted   Controlled type 2 diabetes mellitus without complication, without long-term current use of insulin  (HCC) 12/19/2022   Sciatica 12/19/2022   HTN (hypertension) 12/19/2022   Colonic mass 12/19/2022   Stroke (HCC) 06/18/2022   Stroke (cerebrum) (HCC) 06/18/2022   Home Medication(s) Prior to Admission medications   Medication Sig Start Date End Date Taking? Authorizing Provider  amLODipine  (NORVASC ) 10 MG tablet Take 1 tablet (10 mg total) by mouth daily. 07/31/23   Henderly, Britni A, PA-C  atorvastatin  (LIPITOR ) 80 MG tablet Take 1 tablet (80 mg total) by mouth daily. 06/21/22   Bailey-Modzik, Delila A, NP  ezetimibe  (ZETIA ) 10 MG tablet Take 1 tablet (10 mg total) by mouth daily. 12/20/22   Lue Elsie BROCKS, MD  gabapentin  (NEURONTIN ) 300 MG capsule Take 1 capsule (300 mg total) by mouth 3 (three) times daily. 03/09/23   Sethi, Pramod S, MD  glipiZIDE  (GLUCOTROL ) 5 MG tablet Take 1 tablet (5 mg total) by mouth daily before  breakfast. 06/20/22 03/09/23  Bailey-Modzik, Delila A, NP  lamoTRIgine  (LAMICTAL ) 25 MG tablet Take 1 tablet (25 mg total) by mouth daily. 06/26/23   Rancour, Garnette, MD  lisinopril  (ZESTRIL ) 20 MG tablet Take 1 tablet (20 mg total) by mouth daily. 12/21/22   Lue Elsie BROCKS, MD  metoprolol  tartrate (LOPRESSOR ) 50 MG tablet Take 50 mg by mouth 2 (two) times daily. 01/24/23   [provider]  ondansetron  (ZOFRAN ) 4 MG tablet Take 1 tablet (4 mg total) by mouth every 8 (eight) hours as needed for nausea or vomiting. 11/01/22   Towana Ozell BROCKS, MD  tiZANidine  (ZANAFLEX ) 4 MG tablet Take 1 tablet (4 mg total) by mouth 3 (three) times daily as needed for muscle spasms. 07/31/23   Henderly, Britni A, PA-C  TUMS E-X 750 750 MG chewable tablet Chew 1 tablet by mouth 2 (two) times daily as needed for heartburn.    [provider]  Past Surgical History History reviewed. No pertinent surgical history. Family History Family History  Problem Relation Age of Onset   Cancer Mother    Stroke Father    Stroke Paternal Uncle    Heart attack Paternal Uncle     Social History Social History   Tobacco Use   Smoking status: Never   Smokeless tobacco: Former    Types: Engineer, drilling   Vaping status: Never Used  Substance Use Topics   Alcohol use: Not Currently    Comment: occasionally   Drug use: No   Allergies Patient has no known allergies.  Review of Systems Review of Systems  Neurological:  Positive for weakness and numbness.    Physical Exam Vital Signs  I have reviewed the triage vital signs BP (!) 171/88   Pulse (!) 52   Temp 98.1 F (36.7 C) (Oral)   Resp 18   Ht 5' 8 (1.727 m)   Wt 83 kg   SpO2 99%   BMI 27.82 kg/m   Physical Exam Constitutional:      General: He is not in acute distress.    Appearance: Normal appearance.   HENT:     Head: Normocephalic and atraumatic.     Nose: No congestion or rhinorrhea.  Eyes:     General:        Right eye: No discharge.        Left eye: No discharge.     Extraocular Movements: Extraocular movements intact.     Pupils: Pupils are equal, round, and reactive to light.  Cardiovascular:     Rate and Rhythm: Normal rate and regular rhythm.     Heart sounds: No murmur heard. Pulmonary:     Effort: No respiratory distress.     Breath sounds: No wheezing or rales.  Abdominal:     General: There is no distension.     Tenderness: There is no abdominal tenderness.  Musculoskeletal:        General: Normal range of motion.     Cervical back: Normal range of motion.  Skin:    General: Skin is warm and dry.  Neurological:     General: No focal deficit present.     Mental Status: He is alert.     Sensory: Sensory deficit present.     Motor: Weakness present.     ED Results and Treatments Labs (all labs ordered are listed, but only abnormal results are displayed) Labs Reviewed  CBC - Abnormal; Notable for the following components:      Result Value   Hemoglobin 17.6 (*)    HCT 52.3 (*)    All other components within normal limits  COMPREHENSIVE METABOLIC PANEL WITH GFR - Abnormal; Notable for the following components:   CO2 20 (*)    Glucose, Bld 163 (*)    Total Protein 6.1 (*)    Albumin 3.4 (*)    Total Bilirubin 1.9 (*)    All other components within normal limits  I-STAT CHEM 8, ED - Abnormal; Notable for the following components:   Creatinine, Ser 0.50 (*)    Glucose, Bld 166 (*)    Calcium , Ion 1.14 (*)    Hemoglobin 18.4 (*)    HCT 54.0 (*)    All other components within normal limits  CBG MONITORING, ED - Abnormal; Notable for the following components:   Glucose-Capillary 181 (*)    All other components within normal limits  ETHANOL  PROTIME-INR  APTT  DIFFERENTIAL  RAPID URINE DRUG SCREEN, HOSP PERFORMED                                                                                                                           Radiology MR BRAIN WO CONTRAST Result Date: 07/03/2024 CLINICAL DATA:  Cervical radiculopathy, no red flags; Neuro deficit, acute, stroke suspected; Low back pain, cauda equina syndrome suspected; Myelopathy, acute, thoracic spine. EXAM: MRI CERVICAL, THORACIC AND LUMBAR SPINE WITHOUT CONTRAST TECHNIQUE: Multiplanar and multiecho pulse sequences of the cervical spine, to include the craniocervical junction and cervicothoracic junction, and thoracic and lumbar spine, were obtained without intravenous contrast. COMPARISON:  None Available. FINDINGS: MRI HEAD FINDINGS Brain: No acute infarction, hemorrhage, hydrocephalus, extra-axial collection or mass lesion. The a benign right basal ganglia dilated perivascular space. Vascular: Major arterial flow voids are maintained at the skull base. Skull and upper cervical spine: Normal marrow signal. Sinuses/Orbits: Paranasal sinus mucosal thickening with air-fluid levels in the sphenoid sinuses. Other: No mastoid effusions MRI CERVICAL SPINE FINDINGS Alignment: No substantial sagittal subluxation. Vertebrae: No fracture, evidence of discitis, or bone lesion. Cord: Normal cord signal. Posterior Fossa, vertebral arteries, paraspinal tissues: Visualized vertebral artery flow voids are maintained. Disc levels: C2-C3: Bilateral facet and uncovertebral hypertrophy with mild left greater than right foraminal stenosis. Patent canal. C3-C4: Bilateral facet and uncovertebral hypertrophy with moderate left greater than right foraminal stenosis. Mild canal stenosis. C4-C5: Posterior disc osteophyte complex with bilateral facet and uncovertebral hypertrophy. Resulting severe bilateral foraminal stenosis and moderate canal stenosis. C5-C6: Posterior disc osteophyte complex with left greater than right facet and uncovertebral hypertrophy. Resulting moderate to severe bilateral foraminal stenosis. Mild canal  stenosis. C6-C7: Posterior disc osteophyte complex with bilateral facet and uncovertebral hypertrophy. Resulting mild right foraminal stenosis. Mild canal stenosis. C7-T1: No significant disc protrusion, foraminal stenosis, or canal stenosis. MRI THORACIC SPINE FINDINGS Alignment:  Normal. Vertebrae: No fracture, evidence of discitis, or bone lesion. Cord:  Normal cord signal. Paraspinal and other soft tissues: Unremarkable. Disc levels: Central disc protrusion at T7-T8 contacts and flattens the ventral cord without significant canal stenosis. Multilevel facet arthropathy with moderate left and mild right foraminal stenosis at T9-T10. MRI LUMBAR SPINE FINDINGS Segmentation: Standard. Alignment:  No substantial sagittal subluxation. Vertebrae:  No fracture, evidence of discitis, or bone lesion. Conus medullaris and cauda equina: Conus extends to the L1-L2 level. Conus and cauda equina appear normal. Paraspinal and other soft tissues: Unremarkable. Disc levels: T12-L1: No significant disc protrusion, foraminal stenosis, or canal stenosis. L1-L2: No significant disc protrusion, foraminal stenosis, or canal stenosis. L2-L3: Mild disc bulging with mild canal stenosis. No significant foraminal stenosis. L3-L4: Broad disc bulging and bilateral endplate spurring with facet arthropathy. Resulting mild left greater than right foraminal stenosis. Mild canal stenosis. L4-L5: Broad disc bulge with bilateral facet arthropathy. Resulting mild canal and bilateral foraminal stenosis. L5-S1: Disc bulging endplate spurring. Right greater than left facet arthropathy. Resulting moderate right foraminal stenosis with far lateral disc/osteophyte closely  approximating the exiting/exited right L5 nerve. Mild left foraminal stenosis. Patent canal. IMPRESSION: MRI HEAD: No evidence of acute intracranial abnormality. MRI CERVICAL SPINE: 1. At C4-C5, severe bilateral foraminal stenosis and moderate canal stenosis. 2. At C5-C6, moderate to  severe bilateral foraminal stenosis and mild canal stenosis. 3. At C3-C4, moderate left greater than right foraminal stenosis and mild canal stenosis. MRI THORACIC SPINE: 1. At T7-T8, central disc protrusion contacts and flattens the ventral cord without significant canal stenosis. 2. At T9-T10, moderate left and mild right foraminal stenosis. MRI LUMBAR SPINE: 1. At L5-S1, moderate right foraminal stenosis with far lateral disc/osteophyte closely approximating the exiting/exited right L5 nerve. 2. At L3-L4 and L4-L5, mild canal and bilateral foraminal stenosis. Electronically Signed   By: Gilmore GORMAN Molt M.D.   On: 07/03/2024 20:38   MR Cervical Spine Wo Contrast Result Date: 07/03/2024 CLINICAL DATA:  Cervical radiculopathy, no red flags; Neuro deficit, acute, stroke suspected; Low back pain, cauda equina syndrome suspected; Myelopathy, acute, thoracic spine. EXAM: MRI CERVICAL, THORACIC AND LUMBAR SPINE WITHOUT CONTRAST TECHNIQUE: Multiplanar and multiecho pulse sequences of the cervical spine, to include the craniocervical junction and cervicothoracic junction, and thoracic and lumbar spine, were obtained without intravenous contrast. COMPARISON:  None Available. FINDINGS: MRI HEAD FINDINGS Brain: No acute infarction, hemorrhage, hydrocephalus, extra-axial collection or mass lesion. The a benign right basal ganglia dilated perivascular space. Vascular: Major arterial flow voids are maintained at the skull base. Skull and upper cervical spine: Normal marrow signal. Sinuses/Orbits: Paranasal sinus mucosal thickening with air-fluid levels in the sphenoid sinuses. Other: No mastoid effusions MRI CERVICAL SPINE FINDINGS Alignment: No substantial sagittal subluxation. Vertebrae: No fracture, evidence of discitis, or bone lesion. Cord: Normal cord signal. Posterior Fossa, vertebral arteries, paraspinal tissues: Visualized vertebral artery flow voids are maintained. Disc levels: C2-C3: Bilateral facet and  uncovertebral hypertrophy with mild left greater than right foraminal stenosis. Patent canal. C3-C4: Bilateral facet and uncovertebral hypertrophy with moderate left greater than right foraminal stenosis. Mild canal stenosis. C4-C5: Posterior disc osteophyte complex with bilateral facet and uncovertebral hypertrophy. Resulting severe bilateral foraminal stenosis and moderate canal stenosis. C5-C6: Posterior disc osteophyte complex with left greater than right facet and uncovertebral hypertrophy. Resulting moderate to severe bilateral foraminal stenosis. Mild canal stenosis. C6-C7: Posterior disc osteophyte complex with bilateral facet and uncovertebral hypertrophy. Resulting mild right foraminal stenosis. Mild canal stenosis. C7-T1: No significant disc protrusion, foraminal stenosis, or canal stenosis. MRI THORACIC SPINE FINDINGS Alignment:  Normal. Vertebrae: No fracture, evidence of discitis, or bone lesion. Cord:  Normal cord signal. Paraspinal and other soft tissues: Unremarkable. Disc levels: Central disc protrusion at T7-T8 contacts and flattens the ventral cord without significant canal stenosis. Multilevel facet arthropathy with moderate left and mild right foraminal stenosis at T9-T10. MRI LUMBAR SPINE FINDINGS Segmentation: Standard. Alignment:  No substantial sagittal subluxation. Vertebrae:  No fracture, evidence of discitis, or bone lesion. Conus medullaris and cauda equina: Conus extends to the L1-L2 level. Conus and cauda equina appear normal. Paraspinal and other soft tissues: Unremarkable. Disc levels: T12-L1: No significant disc protrusion, foraminal stenosis, or canal stenosis. L1-L2: No significant disc protrusion, foraminal stenosis, or canal stenosis. L2-L3: Mild disc bulging with mild canal stenosis. No significant foraminal stenosis. L3-L4: Broad disc bulging and bilateral endplate spurring with facet arthropathy. Resulting mild left greater than right foraminal stenosis. Mild canal stenosis.  L4-L5: Broad disc bulge with bilateral facet arthropathy. Resulting mild canal and bilateral foraminal stenosis. L5-S1: Disc bulging endplate spurring. Right greater than left facet  arthropathy. Resulting moderate right foraminal stenosis with far lateral disc/osteophyte closely approximating the exiting/exited right L5 nerve. Mild left foraminal stenosis. Patent canal. IMPRESSION: MRI HEAD: No evidence of acute intracranial abnormality. MRI CERVICAL SPINE: 1. At C4-C5, severe bilateral foraminal stenosis and moderate canal stenosis. 2. At C5-C6, moderate to severe bilateral foraminal stenosis and mild canal stenosis. 3. At C3-C4, moderate left greater than right foraminal stenosis and mild canal stenosis. MRI THORACIC SPINE: 1. At T7-T8, central disc protrusion contacts and flattens the ventral cord without significant canal stenosis. 2. At T9-T10, moderate left and mild right foraminal stenosis. MRI LUMBAR SPINE: 1. At L5-S1, moderate right foraminal stenosis with far lateral disc/osteophyte closely approximating the exiting/exited right L5 nerve. 2. At L3-L4 and L4-L5, mild canal and bilateral foraminal stenosis. Electronically Signed   By: Gilmore GORMAN Molt M.D.   On: 07/03/2024 20:38   MR LUMBAR SPINE WO CONTRAST Result Date: 07/03/2024 CLINICAL DATA:  Cervical radiculopathy, no red flags; Neuro deficit, acute, stroke suspected; Low back pain, cauda equina syndrome suspected; Myelopathy, acute, thoracic spine. EXAM: MRI CERVICAL, THORACIC AND LUMBAR SPINE WITHOUT CONTRAST TECHNIQUE: Multiplanar and multiecho pulse sequences of the cervical spine, to include the craniocervical junction and cervicothoracic junction, and thoracic and lumbar spine, were obtained without intravenous contrast. COMPARISON:  None Available. FINDINGS: MRI HEAD FINDINGS Brain: No acute infarction, hemorrhage, hydrocephalus, extra-axial collection or mass lesion. The a benign right basal ganglia dilated perivascular space. Vascular:  Major arterial flow voids are maintained at the skull base. Skull and upper cervical spine: Normal marrow signal. Sinuses/Orbits: Paranasal sinus mucosal thickening with air-fluid levels in the sphenoid sinuses. Other: No mastoid effusions MRI CERVICAL SPINE FINDINGS Alignment: No substantial sagittal subluxation. Vertebrae: No fracture, evidence of discitis, or bone lesion. Cord: Normal cord signal. Posterior Fossa, vertebral arteries, paraspinal tissues: Visualized vertebral artery flow voids are maintained. Disc levels: C2-C3: Bilateral facet and uncovertebral hypertrophy with mild left greater than right foraminal stenosis. Patent canal. C3-C4: Bilateral facet and uncovertebral hypertrophy with moderate left greater than right foraminal stenosis. Mild canal stenosis. C4-C5: Posterior disc osteophyte complex with bilateral facet and uncovertebral hypertrophy. Resulting severe bilateral foraminal stenosis and moderate canal stenosis. C5-C6: Posterior disc osteophyte complex with left greater than right facet and uncovertebral hypertrophy. Resulting moderate to severe bilateral foraminal stenosis. Mild canal stenosis. C6-C7: Posterior disc osteophyte complex with bilateral facet and uncovertebral hypertrophy. Resulting mild right foraminal stenosis. Mild canal stenosis. C7-T1: No significant disc protrusion, foraminal stenosis, or canal stenosis. MRI THORACIC SPINE FINDINGS Alignment:  Normal. Vertebrae: No fracture, evidence of discitis, or bone lesion. Cord:  Normal cord signal. Paraspinal and other soft tissues: Unremarkable. Disc levels: Central disc protrusion at T7-T8 contacts and flattens the ventral cord without significant canal stenosis. Multilevel facet arthropathy with moderate left and mild right foraminal stenosis at T9-T10. MRI LUMBAR SPINE FINDINGS Segmentation: Standard. Alignment:  No substantial sagittal subluxation. Vertebrae:  No fracture, evidence of discitis, or bone lesion. Conus medullaris  and cauda equina: Conus extends to the L1-L2 level. Conus and cauda equina appear normal. Paraspinal and other soft tissues: Unremarkable. Disc levels: T12-L1: No significant disc protrusion, foraminal stenosis, or canal stenosis. L1-L2: No significant disc protrusion, foraminal stenosis, or canal stenosis. L2-L3: Mild disc bulging with mild canal stenosis. No significant foraminal stenosis. L3-L4: Broad disc bulging and bilateral endplate spurring with facet arthropathy. Resulting mild left greater than right foraminal stenosis. Mild canal stenosis. L4-L5: Broad disc bulge with bilateral facet arthropathy. Resulting mild canal and bilateral foraminal stenosis.  L5-S1: Disc bulging endplate spurring. Right greater than left facet arthropathy. Resulting moderate right foraminal stenosis with far lateral disc/osteophyte closely approximating the exiting/exited right L5 nerve. Mild left foraminal stenosis. Patent canal. IMPRESSION: MRI HEAD: No evidence of acute intracranial abnormality. MRI CERVICAL SPINE: 1. At C4-C5, severe bilateral foraminal stenosis and moderate canal stenosis. 2. At C5-C6, moderate to severe bilateral foraminal stenosis and mild canal stenosis. 3. At C3-C4, moderate left greater than right foraminal stenosis and mild canal stenosis. MRI THORACIC SPINE: 1. At T7-T8, central disc protrusion contacts and flattens the ventral cord without significant canal stenosis. 2. At T9-T10, moderate left and mild right foraminal stenosis. MRI LUMBAR SPINE: 1. At L5-S1, moderate right foraminal stenosis with far lateral disc/osteophyte closely approximating the exiting/exited right L5 nerve. 2. At L3-L4 and L4-L5, mild canal and bilateral foraminal stenosis. Electronically Signed   By: Gilmore GORMAN Molt M.D.   On: 07/03/2024 20:38   MR THORACIC SPINE WO CONTRAST Result Date: 07/03/2024 CLINICAL DATA:  Cervical radiculopathy, no red flags; Neuro deficit, acute, stroke suspected; Low back pain, cauda equina  syndrome suspected; Myelopathy, acute, thoracic spine. EXAM: MRI CERVICAL, THORACIC AND LUMBAR SPINE WITHOUT CONTRAST TECHNIQUE: Multiplanar and multiecho pulse sequences of the cervical spine, to include the craniocervical junction and cervicothoracic junction, and thoracic and lumbar spine, were obtained without intravenous contrast. COMPARISON:  None Available. FINDINGS: MRI HEAD FINDINGS Brain: No acute infarction, hemorrhage, hydrocephalus, extra-axial collection or mass lesion. The a benign right basal ganglia dilated perivascular space. Vascular: Major arterial flow voids are maintained at the skull base. Skull and upper cervical spine: Normal marrow signal. Sinuses/Orbits: Paranasal sinus mucosal thickening with air-fluid levels in the sphenoid sinuses. Other: No mastoid effusions MRI CERVICAL SPINE FINDINGS Alignment: No substantial sagittal subluxation. Vertebrae: No fracture, evidence of discitis, or bone lesion. Cord: Normal cord signal. Posterior Fossa, vertebral arteries, paraspinal tissues: Visualized vertebral artery flow voids are maintained. Disc levels: C2-C3: Bilateral facet and uncovertebral hypertrophy with mild left greater than right foraminal stenosis. Patent canal. C3-C4: Bilateral facet and uncovertebral hypertrophy with moderate left greater than right foraminal stenosis. Mild canal stenosis. C4-C5: Posterior disc osteophyte complex with bilateral facet and uncovertebral hypertrophy. Resulting severe bilateral foraminal stenosis and moderate canal stenosis. C5-C6: Posterior disc osteophyte complex with left greater than right facet and uncovertebral hypertrophy. Resulting moderate to severe bilateral foraminal stenosis. Mild canal stenosis. C6-C7: Posterior disc osteophyte complex with bilateral facet and uncovertebral hypertrophy. Resulting mild right foraminal stenosis. Mild canal stenosis. C7-T1: No significant disc protrusion, foraminal stenosis, or canal stenosis. MRI THORACIC SPINE  FINDINGS Alignment:  Normal. Vertebrae: No fracture, evidence of discitis, or bone lesion. Cord:  Normal cord signal. Paraspinal and other soft tissues: Unremarkable. Disc levels: Central disc protrusion at T7-T8 contacts and flattens the ventral cord without significant canal stenosis. Multilevel facet arthropathy with moderate left and mild right foraminal stenosis at T9-T10. MRI LUMBAR SPINE FINDINGS Segmentation: Standard. Alignment:  No substantial sagittal subluxation. Vertebrae:  No fracture, evidence of discitis, or bone lesion. Conus medullaris and cauda equina: Conus extends to the L1-L2 level. Conus and cauda equina appear normal. Paraspinal and other soft tissues: Unremarkable. Disc levels: T12-L1: No significant disc protrusion, foraminal stenosis, or canal stenosis. L1-L2: No significant disc protrusion, foraminal stenosis, or canal stenosis. L2-L3: Mild disc bulging with mild canal stenosis. No significant foraminal stenosis. L3-L4: Broad disc bulging and bilateral endplate spurring with facet arthropathy. Resulting mild left greater than right foraminal stenosis. Mild canal stenosis. L4-L5: Broad disc bulge with  bilateral facet arthropathy. Resulting mild canal and bilateral foraminal stenosis. L5-S1: Disc bulging endplate spurring. Right greater than left facet arthropathy. Resulting moderate right foraminal stenosis with far lateral disc/osteophyte closely approximating the exiting/exited right L5 nerve. Mild left foraminal stenosis. Patent canal. IMPRESSION: MRI HEAD: No evidence of acute intracranial abnormality. MRI CERVICAL SPINE: 1. At C4-C5, severe bilateral foraminal stenosis and moderate canal stenosis. 2. At C5-C6, moderate to severe bilateral foraminal stenosis and mild canal stenosis. 3. At C3-C4, moderate left greater than right foraminal stenosis and mild canal stenosis. MRI THORACIC SPINE: 1. At T7-T8, central disc protrusion contacts and flattens the ventral cord without significant  canal stenosis. 2. At T9-T10, moderate left and mild right foraminal stenosis. MRI LUMBAR SPINE: 1. At L5-S1, moderate right foraminal stenosis with far lateral disc/osteophyte closely approximating the exiting/exited right L5 nerve. 2. At L3-L4 and L4-L5, mild canal and bilateral foraminal stenosis. Electronically Signed   By: Gilmore GORMAN Molt M.D.   On: 07/03/2024 20:38   CT HEAD CODE STROKE WO CONTRAST Result Date: 07/03/2024 CLINICAL DATA:  Code stroke. Neuro deficit, acute, stroke suspected. Left-sided weakness. EXAM: CT HEAD WITHOUT CONTRAST TECHNIQUE: Contiguous axial images were obtained from the base of the skull through the vertex without intravenous contrast. RADIATION DOSE REDUCTION: This exam was performed according to the departmental dose-optimization program which includes automated exposure control, adjustment of the mA and/or kV according to patient size and/or use of iterative reconstruction technique. COMPARISON:  CT angiogram head/neck 12/07/2023. FINDINGS: Brain: Mild generalized cerebral atrophy. Known chronic lacunar infarct within the left thalamus. Prominent perivascular spaces within the right basal ganglia inferiorly, unchanged. There is no acute intracranial hemorrhage. No demarcated cortical infarct. No extra-axial fluid collection. No evidence of an intracranial mass. No midline shift. Vascular: No hyperdense vessel. Atherosclerotic calcifications. Skull: No calvarial fracture or aggressive osseous lesion. Sinuses/Orbits: No mass or acute finding within the imaged orbits. Mild mucosal thickening within the bilateral maxillary sinuses at the imaged levels. Severe bilateral sphenoid and ethmoid sinusitis. Hypoplastic and opacified right frontal sinus. Moderate left frontal sinusitis. ASPECTS [facility] Stroke Program Early CT Score) - Ganglionic level infarction (caudate, lentiform nuclei, internal capsule, insula, M1-M3 cortex): 7 - Supraganglionic infarction (M4-M6 cortex): 3 Total  score (0-10 with 10 being normal): 10 No evidence of an acute intracranial abnormality. These results were communicated to Dr. Vanessa at 4:54 pmon 7/30/2025by text page via the [facility] messaging system. IMPRESSION: 1. No evidence of an acute intracranial abnormality. 2. Known chronic lacunar infarct within the left thalamus. 3. Mild generalized cerebral atrophy. 4. Paranasal sinus disease at the imaged levels, as described. Correlate for signs/symptoms of acute sinusitis. Electronically Signed   By: Rockey Childs D.O.   On: 07/03/2024 16:55    Pertinent labs & imaging results that were available during my care of the patient were reviewed by me and considered in my medical decision making (see MDM for details).  Medications Ordered in ED Medications - No data to display  Procedures .Critical Care  Performed by: Albertina Dixon, MD Authorized by: Albertina Dixon, MD   Critical care provider statement:    Critical care time (minutes):  30   Critical care was necessary to treat or prevent imminent or life-threatening deterioration of the following conditions:  CNS failure or compromise   Critical care was time spent personally by me on the following activities:  Development of treatment plan with patient or surrogate, discussions with consultants, evaluation of patient's response to treatment, examination of patient, ordering and review of laboratory studies, ordering and review of radiographic studies, ordering and performing treatments and interventions, pulse oximetry, re-evaluation of patient's condition and review of old charts   (including critical care time)  Medical Decision Making / ED Course   This patient presents to the ED for concern of weakness, numbness, this involves an extensive number of treatment options, and is a complaint that carries with it a  high risk of complications and morbidity.  The differential diagnosis includes CVA, hemorrhagic stroke, mass, Todd's paralysis, seizure, electrolyte abnormality, encephalopathy, complicated migraine, cervical cord compression  MDM: Patient seen in the emergency department for evaluation of weakness and numbness.  Physical exam with very poor sharp soft and 2 point discrimination over the upper extremities, trunk, abdomen and lower extremities.  There is weakness in bilateral lower extremities and mild weakness in the upper extremities.  Arrives as a code stroke and taken immediately to the CT scanner where he was evaluated by the stroke neurologist Dr. Vanessa who elicited additional history and it does appear that patient's symptoms have been present for significant longer than the 2 hours previously discussed and code stroke ultimately canceled as patient is outside the window.  Initial head CT without evidence of new stroke.  Laboratory evaluation largely unremarkable.  Given poor sensory exam and known previous history of lumbar compression, an MRI of the brain and neuro access was obtained that is negative for new stroke that shows severe bilateral foraminal stenosis and moderate canal stenosis at C4-C5,, C5-C6, C3-C4, and central disc perfusion at T7-T8 flattening the ventral cord with some signal change.  I spoke with the neurosurgical APP on-call Meyran who came to evaluate the patient at bedside and will admit to the neurosurgical service for possible surgical decompression in the C-spine.  Patient then admitted   Additional history obtained: -Additional history obtained from son -External records from outside source obtained and reviewed including: Chart review including previous notes, labs, imaging, consultation notes   Lab Tests: -I ordered, reviewed, and interpreted labs.   The pertinent results include:   Labs Reviewed  CBC - Abnormal; Notable for the following components:       Result Value   Hemoglobin 17.6 (*)    HCT 52.3 (*)    All other components within normal limits  COMPREHENSIVE METABOLIC PANEL WITH GFR - Abnormal; Notable for the following components:   CO2 20 (*)    Glucose, Bld 163 (*)    Total Protein 6.1 (*)    Albumin 3.4 (*)    Total Bilirubin 1.9 (*)    All other components within normal limits  I-STAT CHEM 8, ED - Abnormal; Notable for the following components:   Creatinine, Ser 0.50 (*)    Glucose, Bld 166 (*)    Calcium , Ion 1.14 (*)    Hemoglobin 18.4 (*)    HCT 54.0 (*)    All other components within normal limits  CBG MONITORING, ED - Abnormal; Notable for the following  components:   Glucose-Capillary 181 (*)    All other components within normal limits  ETHANOL  PROTIME-INR  APTT  DIFFERENTIAL  RAPID URINE DRUG SCREEN, HOSP PERFORMED      EKG   EKG Interpretation Date/Time:  Wednesday July 03 2024 17:04:51 EDT Ventricular Rate:  59 PR Interval:  196 QRS Duration:  90 QT Interval:  435 QTC Calculation: 431 R Axis:   6  Text Interpretation: Sinus rhythm Borderline low voltage, extremity leads Confirmed by Siddhanth Denk ([id number]) on 07/03/2024 10:20:14 PM         Imaging Studies ordered: I ordered imaging studies including CT head, MRI brain, C-spine, T-spine, L-spine I independently visualized and interpreted imaging. I agree with the radiologist interpretation   Medicines ordered and prescription drug management: No orders of the defined types were placed in this encounter.   -I have reviewed the patients home medicines and have made adjustments as needed  Critical interventions Stroke activation and consideration of TNK  Consultations Obtained: I requested consultation with the neurologist on-call, neurosurgical APP on-call,  and discussed lab and imaging findings as well as pertinent plan - they recommend: Surgery admit   Cardiac Monitoring: The patient was maintained on a cardiac monitor.  I personally  viewed and interpreted the cardiac monitored which showed an underlying rhythm of: NSR  Social Determinants of Health:  Factors impacting patients care include: none   Reevaluation: After the interventions noted above, I reevaluated the patient and found that they have :stayed the same  Co morbidities that complicate the patient evaluation  Past Medical History:  Diagnosis Date   Back injury    Chronic back pain    Diabetes mellitus without complication (HCC)    Diverticulosis    Hypertension    Stroke [facility])       Dispostion: I considered admission for this patient, and patient require hospital mission for abnormal neuroexam and concern for severe cervical cord compression.     Final Clinical Impression(s) / ED Diagnoses Final diagnoses:  Cervical cord compression with myelopathy [facility])     @PCDICTATION @    Albertina Dixon, MD 07/03/24 2221

## 2024-07-03 NOTE — ED Provider Triage Note (Signed)
 Emergency Medicine Provider Triage Evaluation Note  Draken Farrior , a 58 y.o. male  was evaluated in triage.  Pt complains of left lower leg numbness that started at 2 PM today.  Reports this is a new deficit, not from prior strokes.  Reports repeated falls over the past week.  Review of Systems  Positive: As above Negative: As above  Physical Exam  BP (!) 179/102 (BP Location: Right Arm)   Pulse 74   Temp 97.8 F (36.6 C)   Resp 16   Ht 5' 8 (1.727 m)   Wt 83 kg   SpO2 95%   BMI 27.82 kg/m  Gen:   Awake, no distress   Resp:  Normal effort  MSK:   Moves extremities without difficulty    Reports numbness in the left calf.  Also appreciated pronator drift of the left upper extremity.  Medical Decision Making  Medically screening exam initiated at 4:37 PM.  Appropriate orders placed.  [name] was informed that the remainder of the evaluation will be completed by another provider, this initial triage assessment does not replace that evaluation, and the importance of remaining in the ED until their evaluation is complete.  Secretary notified and code stroke was activated.   Veta Palma, PA-C 07/03/24 1639

## 2024-07-03 NOTE — Consult Note (Addendum)
 NEUROLOGY CONSULT NOTE   Date of service: July 03, 2024 Patient Name: Theodore Hinton MRN:  [id number] DOB:  06-01-1966 Chief Complaint: L leg weakness Requesting Provider: Albertina Dixon, MD  History of Present Illness  Theodore Hinton is a 58 y.o. male with hx of DM2, prior stroke with residual R sided weakness, who came to the ED on the advice of his PCP for worsening left leg weakness.  He states that his left leg has been giving him trouble at least for the last 3 years.  He had lumbar decompressive surgery in oct 2024. He reports L foot drop since.  He reports that he has been using a L leg AFO for the last 6 months.  He reports that he has been experiencing increasing falls over the last 2 weeks and so he called his PCP's office who advised him to go to the ED about 9 days after talking to his PCP.  The code stroke was activated in the ED for concern that his left leg numbness had been going on for about 2 hours with the rest of his symptoms going on for longer than that.  On my evaluation, he has slight left leg weakness but also left leg pain.  He does endorse some numbness but this has been going on since at least September 2024.  He does state that his symptoms have slightly worsened today after he had a fall.  He endorses multiple falls over the last 2 weeks.  LKW: September 2024 Modified rankin score: 2-Slight disability-UNABLE to perform all activities but does not need assistance IV Thrombolysis: Not offered, clearly outside of window.   EVT: Not offered, low suspicion for LVO.    NIHSS components Score: Comment  1a Level of Conscious 0[x]  1[]  2[]  3[]      1b LOC Questions 0[x]  1[]  2[]       1c LOC Commands 0[x]  1[]  2[]       2 Best Gaze 0[x]  1[]  2[]       3 Visual 0[x]  1[]  2[]  3[]      4 Facial Palsy 0[x]  1[]  2[]  3[]      5a Motor Arm - left 0[x]  1[]  2[]  3[]  4[]  UN[]    5b Motor Arm - Right 0[x]  1[]  2[]  3[]  4[]  UN[]    6a Motor Leg - Left 0[]  1[x]  2[]  3[]  4[]  UN[]    6b Motor  Leg - Right 0[x]  1[]  2[]  3[]  4[]  UN[]    7 Limb Ataxia 0[x]  1[]  2[]  UN[]      8 Sensory 0[]  1[x]  2[]  UN[]      9 Best Language 0[x]  1[]  2[]  3[]      10 Dysarthria 0[x]  1[]  2[]  UN[]      11 Extinct. and Inattention 0[x]  1[]  2[]       TOTAL: 2      ROS  Comprehensive ROS performed and pertinent positives documented in HPI   Past History   Past Medical History:  Diagnosis Date   Back injury    Chronic back pain    Diabetes mellitus without complication (HCC)    Diverticulosis    Hypertension    Stroke [facility])     History reviewed. No pertinent surgical history.  Family History: Family History  Problem Relation Age of Onset   Cancer Mother    Stroke Father    Stroke Paternal Uncle    Heart attack Paternal Uncle     Social History  reports that he has never smoked. He has quit using smokeless tobacco.  His smokeless tobacco use included chew.  He reports that he does not currently use alcohol. He reports that he does not use drugs.  No Known Allergies  Medications  No current facility-administered medications for this encounter.  Current Outpatient Medications:    amLODipine  (NORVASC ) 10 MG tablet, Take 1 tablet (10 mg total) by mouth daily., Disp: 30 tablet, Rfl: 0   aspirin  EC 81 MG tablet, Take 1 tablet (81 mg total) by mouth daily. Swallow whole., Disp: 30 tablet, Rfl: 12   atorvastatin  (LIPITOR ) 80 MG tablet, Take 1 tablet (80 mg total) by mouth daily., Disp: 30 tablet, Rfl: 2   blood glucose meter kit and supplies, Dispense based on patient and insurance preference. Use up to four times daily as directed. (FOR ICD-10 E10.9, E11.9)., Disp: 1 each, Rfl: 0   clopidogrel  (PLAVIX ) 75 MG tablet, Take 1 tablet (75 mg total) by mouth daily., Disp: 20 tablet, Rfl: 0   ezetimibe  (ZETIA ) 10 MG tablet, Take 1 tablet (10 mg total) by mouth daily., Disp: 30 tablet, Rfl: 0   FARXIGA 5 MG TABS tablet, Take 5 mg by mouth daily., Disp: , Rfl:    gabapentin  (NEURONTIN ) 300 MG capsule, Take 1  capsule (300 mg total) by mouth 3 (three) times daily., Disp: 90 capsule, Rfl: 11   glipiZIDE  (GLUCOTROL ) 5 MG tablet, Take 1 tablet (5 mg total) by mouth daily before breakfast., Disp: 30 tablet, Rfl: 2   lamoTRIgine  (LAMICTAL ) 25 MG tablet, Take 1 tablet (25 mg total) by mouth daily., Disp: 60 tablet, Rfl: 0   lidocaine  (LIDODERM ) 5 %, Place 1 patch onto the skin daily. Remove & Discard patch within 12 hours or as directed by MD, Disp: 30 patch, Rfl: 0   lisinopril  (ZESTRIL ) 20 MG tablet, Take 1 tablet (20 mg total) by mouth daily., Disp: 30 tablet, Rfl: 0   metoprolol  tartrate (LOPRESSOR ) 50 MG tablet, Take 50 mg by mouth 2 (two) times daily., Disp: , Rfl:    ondansetron  (ZOFRAN ) 4 MG tablet, Take 1 tablet (4 mg total) by mouth every 8 (eight) hours as needed for nausea or vomiting., Disp: 15 tablet, Rfl: 0   oxyCODONE -acetaminophen  (PERCOCET) 7.5-325 MG tablet, Take 1 tablet by mouth every 4 (four) hours as needed for moderate pain., Disp: , Rfl:    predniSONE  (STERAPRED UNI-PAK 21 TAB) 10 MG (21) TBPK tablet, Take by mouth daily. Take 6 tabs by mouth daily  for 1 days, then 5 tabs for 1 days, then 4 tabs for 1 days, then 3 tabs for 1 days, 2 tabs for 1 days, then 1 tab by mouth daily for 1 days, Disp: 21 tablet, Rfl: 0   promethazine (PHENERGAN) 25 MG tablet, Take 25 mg by mouth every 6 (six) hours as needed for nausea or vomiting., Disp: , Rfl:    tiZANidine  (ZANAFLEX ) 4 MG tablet, Take 1 tablet (4 mg total) by mouth 3 (three) times daily as needed for muscle spasms., Disp: 30 tablet, Rfl: 0   TUMS E-X 750 750 MG chewable tablet, Chew 1 tablet by mouth 2 (two) times daily as needed for heartburn., Disp: , Rfl:   Vitals   Vitals:   07/03/24 1544 07/03/24 1549  BP: (!) 179/102   Pulse: 74   Resp: 16   Temp: 97.8 F (36.6 C)   SpO2: 95%   Weight:  83 kg  Height:  5' 8 (1.727 m)    Body mass index is 27.82 kg/m.   Physical Exam   General: Laying comfortably in bed; in  no acute  distress.  HENT: Normal oropharynx and mucosa. Normal external appearance of ears and nose.  Neck: Supple, no pain or tenderness  CV: No JVD. No peripheral edema.  Pulmonary: Symmetric Chest rise. Normal respiratory effort.  Abdomen: Soft to touch, non-tender.  Ext: No cyanosis, edema, or deformity  Skin: No rash. Normal palpation of skin.   Musculoskeletal: Normal digits and nails by inspection. No clubbing.   Neurologic Examination  Mental status/Cognition: Alert, oriented to self, place, month and year, good attention.  Speech/language: Fluent, comprehension intact, object naming intact, repetition intact.  Cranial nerves:   CN II Pupils equal and reactive to light, no VF deficits    CN III,IV,VI EOM intact, no gaze preference or deviation, no nystagmus    CN V normal sensation in V1, V2, and V3 segments bilaterally    CN VII no asymmetry, no nasolabial fold flattening    CN VIII normal hearing to speech    CN IX & X normal palatal elevation, no uvular deviation    CN XI 5/5 head turn and 5/5 shoulder shrug bilaterally    CN XII midline tongue protrusion    Motor:  Muscle bulk: normal, tone normal, pronator drift none tremor none Mvmt Root Nerve  Muscle Right Left Comments  SA C5/6 Ax Deltoid 5 5   EF C5/6 Mc Biceps 5 5   EE C6/7/8 Rad Triceps 4 5   WF C6/7 Med FCR     WE C7/8 PIN ECU     F Ab C8/T1 U ADM/FDI 4+ 4   HF L1/2/3 Fem Illopsoas 4+ 4   KE L2/3/4 Fem Quad 4+ 4   DF L4/5 D Peron Tib Ant 4+ 4   PF S1/2 Tibial Grc/Sol 4+ 4    Sensation:  Light touch Slightly decreased in right lower extremity to touch.   Pin prick    Temperature    Vibration   Proprioception    Coordination/Complex Motor:  - Finger to Nose intact bilaterally - Heel to shin unable to do due to pain but no obvious ataxia noted. - Rapid alternating movement are slowed on the right. - Gait: Deferred for patient safety.  Labs/Imaging/Neurodiagnostic studies   CBC:  Recent Labs  Lab  07/03/24 1634 07/03/24 1645  WBC 8.8  --   NEUTROABS 6.0  --   HGB 17.6* 18.4*  HCT 52.3* 54.0*  MCV 91.0  --   PLT 230  --    Basic Metabolic Panel:  Lab Results  Component Value Date   NA 138 07/03/2024   K 4.0 07/03/2024   CO2 20 (L) 07/03/2024   GLUCOSE 166 (H) 07/03/2024   BUN 7 07/03/2024   CREATININE 0.50 (L) 07/03/2024   CALCIUM  8.9 07/03/2024   GFRNONAA >60 07/03/2024   GFRAA >60 02/04/2019   Lipid Panel:  Lab Results  Component Value Date   LDLCALC 143 (H) 12/19/2022   HgbA1c:  Lab Results  Component Value Date   HGBA1C 9.5 (H) 12/19/2022   Urine Drug Screen:     Component Value Date/Time   LABOPIA NONE DETECTED 12/07/2023 0340   COCAINSCRNUR NONE DETECTED 12/07/2023 0340   LABBENZ NONE DETECTED 12/07/2023 0340   AMPHETMU NONE DETECTED 12/07/2023 0340   THCU NONE DETECTED 12/07/2023 0340   LABBARB NONE DETECTED 12/07/2023 0340    Alcohol Level     Component Value Date/Time   [facility] <15 07/03/2024 1634   INR  Lab Results  Component Value Date   INR 1.0  07/03/2024   APTT  Lab Results  Component Value Date   APTT 26 07/03/2024   AED levels: No results found for: PHENYTOIN, ZONISAMIDE, LAMOTRIGINE , LEVETIRACETA  CT Head without contrast(Personally reviewed): CTH was negative for a large hypodensity concerning for a large territory infarct or hyperdensity concerning for an ICH  MRI Brain(Personally reviewed): Pending  ASSESSMENT   Brevan Luberto is a 58 y.o. male with hx of DM2, prior stroke with residual R sided weakness, who came to the ED on the advice of his PCP for acute on chronic left leg weakness and a left leg foot drop after lumbar decompressive surgery in October 2024.  The decline has been more precipitous over the last 2 weeks with multiple falls. RECOMMENDATIONS  -MRI brain, C, T, L-spine w/o contrast.  If the MRI is negative, neck step would be EMG nerve conduction studies which will need to be done  outpatient. ______________________________________________________________________  Plan discussed with Dr. Albertina with the ED team.  Signed, Duaa Stelzner, MD [facility]

## 2024-07-03 NOTE — ED Triage Notes (Signed)
 Patient arrives ambulatory by POV states he had a fall yesterday and again today. States his doctor sent him here for stroke work up. States in past had a stroke where his leg gave out just like this incident. Patient having left sided weakness since 4pm. Facial droop noted.

## 2024-07-04 LAB — CBG MONITORING, ED
Glucose-Capillary: 115 mg/dL — ABNORMAL HIGH (ref 70–99)
Glucose-Capillary: 129 mg/dL — ABNORMAL HIGH (ref 70–99)

## 2024-07-04 LAB — GLUCOSE, CAPILLARY
Glucose-Capillary: 105 mg/dL — ABNORMAL HIGH (ref 70–99)
Glucose-Capillary: 99 mg/dL (ref 70–99)

## 2024-07-04 NOTE — Evaluation (Signed)
 Occupational Therapy Evaluation Patient Details Name: Theodore Hinton MRN: [id number] DOB: May 08, 1966 Today's Date: 07/04/2024   History of Present Illness   Theodore Hinton is an 58 y.o. male who presented due to progressive weakness and increased falls. Imaging negative for acute stroke. PMHx: back injury with sx, DM, HTN, stroke     Clinical Impressions Haskell was evaluated s/p the above admission list. He is mod I at baseline but reports recent increased weakness and falls. Pt has an AFO for his chronic L foot drop, but only wears it as needed. Upon evaluation, pt demonstrated mod I ability to complete mobility and ADLs. Supervision A provided throughout for safety, however pt is presenting at/near his functional baseline. Education provided on importance to use his AFO and to use AD to prevent further falls. MMT and ROM were WFL. Pt complained of back pain throughout assessment. Pt does not require further acute, or follow up OT services. Recommend discharge back to pt's environment with assist as needed. OT to sign off with appreciation of order, please re-consult if needed.        If plan is discharge home, recommend the following:   Assistance with cooking/housework     Functional Status Assessment   Patient has had a recent decline in their functional status and demonstrates the ability to make significant improvements in function in a reasonable and predictable amount of time.     Equipment Recommendations   None recommended by OT      Precautions/Restrictions   Precautions Precautions: Fall Precaution/Restrictions Comments: chronic L footdrop Restrictions Weight Bearing Restrictions Per Provider Order: No     Mobility Bed Mobility Overal bed mobility: Independent        Transfers Overall transfer level: Modified independent Equipment used: Rolling walker (2 wheels)       Balance Overall balance assessment: Mild deficits observed, not formally  tested       ADL either performed or assessed with clinical judgement   ADL Overall ADL's : Needs assistance/impaired Eating/Feeding: Independent   Grooming: Modified independent;Standing Grooming Details (indicate cue type and reason): at the sink Upper Body Bathing: Modified independent   Lower Body Bathing: Modified independent   Upper Body Dressing : Modified independent   Lower Body Dressing: Modified independent   Toilet Transfer: Modified Independent;Rolling walker (2 wheels)   Toileting- Clothing Manipulation and Hygiene: Independent;Sit to/from stand       Functional mobility during ADLs: Modified independent;Rolling walker (2 wheels) General ADL Comments: supervision A provided throughout evaluation for safety only. Pt demonstrated mod I ability to ambulate and complete BADLs     Vision Baseline Vision/History: 0 No visual deficits Vision Assessment?: No apparent visual deficits     Perception Perception: Not tested       Praxis Praxis: Not tested       Pertinent Vitals/Pain Pain Assessment Pain Assessment: 0-10 Pain Score: 8  Pain Location: low back pain Pain Descriptors / Indicators: Discomfort Pain Intervention(s): Limited activity within patient's tolerance, Monitored during session     Extremity/Trunk Assessment Upper Extremity Assessment Upper Extremity Assessment: Overall WFL for tasks assessed (denies current sensation changes)   Lower Extremity Assessment Lower Extremity Assessment: Defer to PT evaluation   Cervical / Trunk Assessment Cervical / Trunk Assessment: Other exceptions Cervical / Trunk Exceptions: reports back pain   Communication Communication Communication: No apparent difficulties   Cognition Arousal: Alert Behavior During Therapy: WFL for tasks assessed/performed Cognition: No family/caregiver present to determine baseline  OT - Cognition Comments: Overall WFL for orienteation, command following and  basic ADLs. Pt verbose throughout, detailing history from 1 week to 9 years ago. Difficult to understand true PLOF.                 Following commands: Intact       Cueing  General Comments   Cueing Techniques: Verbal cues  VSS on RA           Home Living Family/patient expects to be discharged to:: Private residence Living Arrangements: Spouse/significant other;Non-relatives/Friends Available Help at Discharge: Family;Friend(s);Available 24 hours/day Type of Home: Mobile home       Home Layout: One level     Bathroom Shower/Tub: Producer, television/film/video: Standard     Home Equipment: Agricultural consultant (2 wheels);Cane - single point;Shower seat;BSC/3in1;Grab bars - tub/shower;Grab bars - toilet          Prior Functioning/Environment Prior Level of Function : Working/employed;Driving             Mobility Comments: no device, 4-5 recent falls 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[employer],    OT Problem List: Decreased activity tolerance;Impaired balance (sitting and/or standing);Decreased safety awareness;Decreased knowledge of use of DME or AE        OT Goals(Current goals can be found in the care plan section)   Acute Rehab OT Goals Patient Stated Goal: home OT Goal Formulation: With patient Time For Goal Achievement: 07/18/24 Potential to Achieve Goals: Good   AM-PAC OT 6 Clicks Daily Activity     Outcome Measure Help from another person eating meals?: None Help from another person taking care of personal grooming?: None Help from another person toileting, which includes using toliet, bedpan, or urinal?: None Help from another person bathing (including washing, rinsing, drying)?: None Help from another person to put on and taking off regular upper body clothing?: None Help from another person to put on and taking off regular lower body clothing?: None 6 Click Score: 24   End of Session Equipment Utilized During Treatment: Rolling walker (2  wheels) Nurse Communication: Mobility status;Patient requests pain meds  Activity Tolerance: Patient tolerated treatment well Patient left: in bed;with call bell/phone within reach  OT Visit Diagnosis: Unsteadiness on feet (R26.81);Muscle weakness (generalized) (M62.81);History of falling (Z91.81);Repeated falls (R29.6)                Time: 9181-9161 OT Time Calculation (min): 20 min Charges:  OT General Charges $OT Visit: 1 Visit OT Evaluation $OT Eval Moderate Complexity: 1 Mod  Lucie Kendall, OTR/L Acute Rehabilitation Services Office [phone] Secure Chat Communication Preferred   Lucie JONETTA Kendall 07/04/2024, 9:51 AM

## 2024-07-04 NOTE — Progress Notes (Signed)
 Have paged the [facility] hospitalists twice this morning, 45 min apart with no response. Would appreciate their help with medical management.

## 2024-07-04 NOTE — ED Notes (Signed)
 Pt endorsing some back pain - currently NPO per orders and pain meds are PO. Will attempt to reach neurosurgery for pain management.

## 2024-07-04 NOTE — Evaluation (Signed)
 Physical Therapy Evaluation Patient Details Name: Theodore Hinton MRN: [id number] DOB: 1966-06-26 Today's Date: 07/04/2024  History of Present Illness  Theodore Hinton is an 58 y.o. male who presented due to progressive weakness and increased falls. Imaging negative for acute stroke. PMHx: back injury with sx, DM, HTN, stroke  Clinical Impression  Pt is presenting below baseline level of functioning. Prior to hospitalization pt was Ind and states he was playing golf 1 week ago. He wears an AFO on his L LE due to previous CVA but was very active. Currently pt is supervision for gait with significant mid back pain. Pt will benefit from continued skilled physical therapy services in acute care hospital setting at this time.    Recommending to follow physician recommendation for physical therapy once discharged from acute care hospital setting.         If plan is discharge home, recommend the following: A little help with walking and/or transfers;Assist for transportation;Assistance with cooking/housework     Equipment Recommendations None recommended by PT     Functional Status Assessment Patient has had a recent decline in their functional status and demonstrates the ability to make significant improvements in function in a reasonable and predictable amount of time.     Precautions / Restrictions Precautions Precautions: Fall Precaution/Restrictions Comments: chronic L footdrop Restrictions Weight Bearing Restrictions Per Provider Order: No      Mobility  Bed Mobility Overal bed mobility: Independent      Transfers Overall transfer level: Modified independent Equipment used: None    Ambulation/Gait Ambulation/Gait assistance: Supervision Gait Distance (Feet): 80 Feet Assistive device: None Gait Pattern/deviations: Step-through pattern, Decreased dorsiflexion - left, Steppage, Drifts right/left Gait velocity: decreased Gait velocity interpretation: <1.8 ft/sec, indicate of  risk for recurrent falls   General Gait Details: impaired balance and significant frontal plane sway with gait wiht slightly crouched gait pattern with L > R and L foot drop with supinationon the L during swing phase of gait.     Balance Overall balance assessment: Needs assistance Sitting-balance support: Single extremity supported, Feet unsupported Sitting balance-Leahy Scale: Normal     Standing balance support: During functional activity, No upper extremity supported Standing balance-Leahy Scale: Fair Standing balance comment: pt had no overt LOB but requires multiple instances of both hip and stepping strategy       Pertinent Vitals/Pain Pain Assessment Pain Assessment: 0-10 Pain Score: 8  Pain Location: mid back pain Pain Descriptors / Indicators: Discomfort Pain Intervention(s): Limited activity within patient's tolerance, Monitored during session    Home Living Family/patient expects to be discharged to:: Private residence Living Arrangements: Spouse/significant other;Non-relatives/Friends Available Help at Discharge: Family;Friend(s);Available 24 hours/day Type of Home: Mobile home Home Access: Ramped entrance       Home Layout: One level Home Equipment: Agricultural consultant (2 wheels);Cane - single point;Shower seat;BSC/3in1;Grab bars - tub/shower;Grab bars - toilet      Prior Function Prior Level of Function : Working/employed;Driving             Mobility Comments: no device, 4-5 recent falls 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[employer],     Extremity/Trunk Assessment   Upper Extremity Assessment Upper Extremity Assessment: Defer to OT evaluation    Lower Extremity Assessment Lower Extremity Assessment: LLE deficits/detail LLE Deficits / Details: drop foot on the L foot during gait with some tone that causes supination    Cervical / Trunk Assessment Cervical / Trunk Assessment: Other exceptions Cervical / Trunk Exceptions: reports back pain  Communication  Communication Communication: No apparent difficulties    Cognition Arousal: Alert Behavior During Therapy: WFL for tasks assessed/performed   PT - Cognitive impairments: No apparent impairments   Following commands: Intact       Cueing Cueing Techniques: Verbal cues     General Comments General comments (skin integrity, edema, etc.): no signs/symptoms of cardiac/respiratory distress during session        Assessment/Plan    PT Assessment Patient needs continued PT services  PT Problem List Decreased balance;Decreased mobility       PT Treatment Interventions DME instruction;Gait training;Functional mobility training;Therapeutic activities;Therapeutic exercise;Patient/family education;Balance training    PT Goals (Current goals can be found in the Care Plan section)  Acute Rehab PT Goals Patient Stated Goal: to get back to being active and playing golf PT Goal Formulation: With patient Time For Goal Achievement: 07/18/24 Potential to Achieve Goals: Good Additional Goals Additional Goal #1: Pt will report mid back pain 5/10 or less with activity for 10 min or longer    Frequency Min 1X/week        AM-PAC PT 6 Clicks Mobility  Outcome Measure Help needed turning from your back to your side while in a flat bed without using bedrails?: None Help needed moving from lying on your back to sitting on the side of a flat bed without using bedrails?: None Help needed moving to and from a bed to a chair (including a wheelchair)?: A Little Help needed standing up from a chair using your arms (e.g., wheelchair or bedside chair)?: None Help needed to walk in hospital room?: A Little Help needed climbing 3-5 steps with a railing? : A Little 6 Click Score: 21    End of Session Equipment Utilized During Treatment: Gait belt Activity Tolerance: Patient tolerated treatment well Patient left: in bed;with call bell/phone within reach Nurse Communication: Mobility status PT Visit  Diagnosis: Unsteadiness on feet (R26.81);Other abnormalities of gait and mobility (R26.89)    Time: 8653-8643 PT Time Calculation (min) (ACUTE ONLY): 10 min   Charges:   PT Evaluation $PT Eval Low Complexity: 1 Low   PT General Charges $$ ACUTE PT VISIT: 1 Visit        Dorothyann Maier, DPT, CLT  Acute Rehabilitation Services Office: [phone] (Secure chat preferred)   Dorothyann VEAR Maier 07/04/2024, 2:37 PM

## 2024-07-04 NOTE — Progress Notes (Signed)
 Admission screenings completed at this time.  No family at bedside.     07/04/24 1130  Patient Belongings  Patient/Family advised about valuables policy? Yes  Home Medications No meds brought to hospital  Patient Belongings Kept at bedside  Belongings at Bedside Clothing;Electronic device(s)  Bedside: Electronic Device(s) Cellphone

## 2024-07-04 NOTE — ED Notes (Signed)
 Neurosurgery team contacted again - see order modification for pt to receive PRN pain meds.

## 2024-07-04 NOTE — Progress Notes (Signed)
 NEUROSURGERY PROGRESS NOTE  Patient resting comfortably in bed. No acute events over night. There seemed to be some confusion regarding who did his previous back surgery, it was not Dr. Louis, it was someone in [place]. Patient is eager to get his neck fixed. There is no operative time on the schedule tomorrow or Monday so this would have to be done some time next would. Ideally, would like to get him tuned up for this and discharged home until then. Will consult medicine to help.   Temp:  [97.7 F (36.5 C)-98.1 F (36.7 C)] 97.7 F (36.5 C) (07/31 0852) Pulse Rate:  [49-74] 51 (07/31 0852) Resp:  [11-18] 11 (07/31 0852) BP: (136-185)/(67-102) 185/93 (07/31 0852) SpO2:  [95 %-100 %] 99 % (07/31 0852) Weight:  [83 kg] 83 kg (07/30 1549)    Suzen Chiquita Pean, NP 07/04/2024 9:10 AM

## 2024-07-04 NOTE — ED Notes (Signed)
 Neurosurgery team contacted again - see MAR for intervention

## 2024-07-04 NOTE — Progress Notes (Signed)
 Admission assessment/screenings  completed at this time.

## 2024-07-05 DIAGNOSIS — G959 Disease of spinal cord, unspecified: Secondary | ICD-10-CM

## 2024-07-05 LAB — GLUCOSE, CAPILLARY
Glucose-Capillary: 134 mg/dL — ABNORMAL HIGH (ref 70–99)
Glucose-Capillary: 196 mg/dL — ABNORMAL HIGH (ref 70–99)
Glucose-Capillary: 204 mg/dL — ABNORMAL HIGH (ref 70–99)
Glucose-Capillary: 146 mg/dL — ABNORMAL HIGH (ref 70–99)

## 2024-07-05 LAB — HEMOGLOBIN A1C
Hgb A1c MFr Bld: 7.6 % — ABNORMAL HIGH (ref 4.8–5.6)
Mean Plasma Glucose: 171 mg/dL

## 2024-07-05 LAB — RAPID URINE DRUG SCREEN, HOSP PERFORMED
Amphetamines: NOT DETECTED
Barbiturates: NOT DETECTED
Benzodiazepines: NOT DETECTED
Cocaine: NOT DETECTED
Opiates: NOT DETECTED
Tetrahydrocannabinol: NOT DETECTED

## 2024-07-05 LAB — SURGICAL PCR SCREEN
MRSA, PCR: NEGATIVE
Staphylococcus aureus: NEGATIVE

## 2024-07-05 MED ORDER — LISINOPRIL 20 MG PO TABS
20.0000 mg | ORAL_TABLET | Freq: Every day | ORAL | Status: AC
  Administered 2024-07-06 – 2024-07-07 (×2): 20 mg via ORAL
  Filled 2024-07-05 (×2): qty 1

## 2024-07-05 MED ORDER — INSULIN ASPART 100 UNIT/ML IJ SOLN
0.0000 [IU] | Freq: Every day | INTRAMUSCULAR | Status: DC
  Administered 2024-07-09: 3 [IU] via SUBCUTANEOUS

## 2024-07-05 MED ORDER — INSULIN ASPART 100 UNIT/ML IJ SOLN
0.0000 [IU] | Freq: Three times a day (TID) | INTRAMUSCULAR | Status: DC
  Administered 2024-07-05: 5 [IU] via SUBCUTANEOUS
  Administered 2024-07-06 (×2): 3 [IU] via SUBCUTANEOUS
  Administered 2024-07-07: 2 [IU] via SUBCUTANEOUS
  Administered 2024-07-07: 5 [IU] via SUBCUTANEOUS
  Administered 2024-07-07 – 2024-07-08 (×2): 2 [IU] via SUBCUTANEOUS
  Administered 2024-07-09 (×2): 11 [IU] via SUBCUTANEOUS
  Administered 2024-07-09: 8 [IU] via SUBCUTANEOUS
  Administered 2024-07-10: 3 [IU] via SUBCUTANEOUS
  Administered 2024-07-10: 5 [IU] via SUBCUTANEOUS
  Administered 2024-07-10 – 2024-07-11 (×2): 3 [IU] via SUBCUTANEOUS

## 2024-07-05 MED ORDER — INSULIN ASPART 100 UNIT/ML IJ SOLN
4.0000 [IU] | Freq: Three times a day (TID) | INTRAMUSCULAR | Status: DC
  Administered 2024-07-05 – 2024-07-11 (×12): 4 [IU] via SUBCUTANEOUS

## 2024-07-05 NOTE — TOC Initial Note (Signed)
 Transition of Care [facility]) - Initial/Assessment Note    Patient Details  Name: Theodore Hinton MRN: [id number] Date of Birth: [birth year]  [facility] CM/SW Contact:    Andrez JULIANNA George, RN Phone Number: 07/05/2024, 12:57 PM  Clinical Narrative:                  Pt is from home with his spouse and grandson. Spouse works outside the home during the daytime. Pt states both he and his spouse drive.  He manages his own medications. CM inquired about his non-compliance. He states his pharmacy runs out of meds. Sometimes its days before he can get his meds. CM talked with him about changing pharmacies and the importance of taking medications as prescribed.  Plan is for surgery next week.  IP Care Management following.  Expected Discharge Plan: Home/Self Care Barriers to Discharge: Continued Medical Work up   Patient Goals and CMS Choice            Expected Discharge Plan and Services       Living arrangements for the past 2 months: Single Family Home                                      Prior Living Arrangements/Services Living arrangements for the past 2 months: Single Family Home Lives with:: Spouse Patient language and need for interpreter reviewed:: Yes Do you feel safe going back to the place where you live?: Yes          Current home services: DME (cane/ walker/ shower seat) Criminal Activity/Legal Involvement Pertinent to Current Situation/Hospitalization: No - Comment as needed  Activities of Daily Living   ADL Screening (condition at time of admission) Independently performs ADLs?: Yes (appropriate for developmental age) Is the patient deaf or have difficulty hearing?: No Does the patient have difficulty seeing, even when wearing glasses/contacts?: No Does the patient have difficulty concentrating, remembering, or making decisions?: No  Permission Sought/Granted                  Emotional Assessment Appearance:: Appears stated  age Attitude/Demeanor/Rapport: Engaged Affect (typically observed): Accepting Orientation: : Oriented to Self, Oriented to Place, Oriented to  Time, Oriented to Situation   Psych Involvement: No (comment)  Admission diagnosis:  Cervical myelopathy (HCC) [G95.9] Cervical cord compression with myelopathy (HCC) [G95.20] Patient Active Problem List   Diagnosis Date Noted   Cervical myelopathy (HCC) 07/03/2024   Controlled type 2 diabetes mellitus without complication, without long-term current use of insulin  (HCC) 12/19/2022   Sciatica 12/19/2022   HTN (hypertension) 12/19/2022   Colonic mass 12/19/2022   Stroke (HCC) 06/18/2022   Stroke (cerebrum) (HCC) 06/18/2022   PCP:  [facility]:   [facility] [address], [place] - [address] [place] [address] Phone: [phone] Fax: [phone]     Social Drivers of Health (SDOH) Social History: SDOH Screenings   Food Insecurity: No Food Insecurity (07/04/2024)  Housing: Low Risk  (07/04/2024)  Transportation Needs: No Transportation Needs (07/04/2024)  Utilities: Not At Risk (07/04/2024)  Financial Resource Strain: Medium Risk (08/28/2023)   [facility] from [facility]  Social Connections: Unknown (12/28/2022)   Received from [facility]  Tobacco Use: Medium Risk (07/03/2024)   SDOH Interventions:     Readmission Risk Interventions     No data to display

## 2024-07-05 NOTE — Plan of Care (Signed)
  Problem: Coping: Goal: Ability to adjust to condition or change in health will improve Outcome: Progressing   Problem: Nutritional: Goal: Maintenance of adequate nutrition will improve Outcome: Progressing Goal: Progress toward achieving an optimal weight will improve Outcome: Progressing   Problem: Skin Integrity: Goal: Risk for impaired skin integrity will decrease Outcome: Progressing   Problem: Clinical Measurements: Goal: Ability to maintain clinical measurements within normal limits will improve Outcome: Progressing Goal: Will remain free from infection Outcome: Progressing Goal: Diagnostic test results will improve Outcome: Progressing Goal: Respiratory complications will improve Outcome: Progressing Goal: Cardiovascular complication will be avoided Outcome: Progressing   Problem: Activity: Goal: Risk for activity intolerance will decrease Outcome: Progressing   Problem: Elimination: Goal: Will not experience complications related to bowel motility Outcome: Progressing Goal: Will not experience complications related to urinary retention Outcome: Progressing   Problem: Pain Managment: Goal: General experience of comfort will improve and/or be controlled Outcome: Progressing   Problem: Skin Integrity: Goal: Risk for impaired skin integrity will decrease Outcome: Progressing   Problem: Safety: Goal: Ability to remain free from injury will improve Outcome: Progressing

## 2024-07-05 NOTE — Plan of Care (Signed)
 Patient did well today.  Pain managed with PRN.  Expecting surgery Monday.

## 2024-07-05 NOTE — Consult Note (Signed)
 Initial Consultation Note   Patient: Theodore Hinton FMW:[id number] DOB: May 15, 1966 PCP: [facility] DOA: 07/03/2024 DOS: the patient was seen and examined on 07/05/2024 Primary service: Onetha Kuba, MD  Referring physician: Onetha Reason for consult: DM management  Assessment/Plan: Cervical myelopathy C4-C5 leading to frequent falls: With severe bilateral cervical foraminal stenosis, further management per neurosurgery. MRI of the brain showed no acute abnormalities, neurology was consulted also recommended a CT of cervical thoracic and lumbar spine that showed significant disease.  Uncontrolled diabetes mellitus type 2: With last A1c of 7.6, will start on sliding scale insulin  with meal coverage. Discontinue metformin . He has required minimal insulin  while he has been here in the hospital.  Essential hypertension: Continue metoprolol  and Norvasc  and lisinopril  can discontinue lisinopril  on Sunday in anticipation of surgical intervention.  History of CVA: He was supposedly on Plavix  has not been taking it for several weeks. Will hold Plavix  and can restart postsurgical intervention, neurosurgery to dictate when to start Plavix . In his med rec he supposed to be taking lamotrigine , he relates he has not taken that in months. He does not know why he was taking it.    TR[facility] will continue to follow the patient.  HPI: Theodore Hinton is a 58 y.o. male with past medical history of past medical history of essential hypertension, history of CVA in January 2024 with right residual weaknes, history of L5 radiculopathy status post laminectomy by neurosurgery at [facility] and diabetes mellitus came into the ED for several weeks of progressive weakness and falling start developing numbness and tingling in his hand.  He has not taking his Plavix  over the last several weeks  Neurosurgery admitted the patient for cervical myelopathy and decompression that will be done on 07/07/2024 and they  consulted us  for hypertension and diabetes control.  Review of Systems: As mentioned in the history of present illness. All other systems reviewed and are negative. Past Medical History:  Diagnosis Date   Back injury    Chronic back pain    Diabetes mellitus without complication (HCC)    Diverticulosis    Hypertension    Stroke [facility])    History reviewed. No pertinent surgical history. Social History:  reports that he has never smoked. He has quit using smokeless tobacco.  His smokeless tobacco use included chew. He reports that he does not currently use alcohol. He reports that he does not use drugs.  No Known Allergies  Family History  Problem Relation Age of Onset   Cancer Mother    Stroke Father    Stroke Paternal Uncle    Heart attack Paternal Uncle     Prior to Admission medications   Medication Sig Start Date End Date Taking? Authorizing Provider  amLODipine  (NORVASC ) 10 MG tablet Take 1 tablet (10 mg total) by mouth daily. 07/31/23  Yes Henderly, Britni A, PA-C  clopidogrel  (PLAVIX ) 75 MG tablet Take 75 mg by mouth daily.   Yes [provider]  gabapentin  (NEURONTIN ) 300 MG capsule Take 1 capsule (300 mg total) by mouth 3 (three) times daily. 03/09/23  Yes Rosemarie Eather RAMAN, MD  oxyCODONE -acetaminophen  (PERCOCET) 10-325 MG tablet Take 1 tablet by mouth 4 (four) times daily as needed.   Yes [provider]  atorvastatin  (LIPITOR ) 80 MG tablet Take 1 tablet (80 mg total) by mouth daily. Patient not taking: Reported on 07/03/2024 06/21/22   Bailey-Modzik, Ples A, NP  ezetimibe  (ZETIA ) 10 MG tablet Take 1 tablet (10 mg total) by mouth  daily. Patient not taking: Reported on 07/03/2024 12/20/22   Lue Elsie BROCKS, MD  lamoTRIgine  (LAMICTAL ) 25 MG tablet Take 1 tablet (25 mg total) by mouth daily. Patient not taking: Reported on 07/03/2024 06/26/23   Carita Senior, MD  lisinopril  (ZESTRIL ) 20 MG tablet Take 1 tablet (20 mg total) by mouth daily. Patient not  taking: Reported on 07/03/2024 12/21/22   Lue Elsie BROCKS, MD  metFORMIN  (GLUCOPHAGE ) 500 MG tablet Take 500 mg by mouth 2 (two) times daily. Patient not taking: Reported on 07/03/2024    [provider]  metoprolol  tartrate (LOPRESSOR ) 50 MG tablet Take 50 mg by mouth 2 (two) times daily. Patient not taking: Reported on 07/03/2024 01/24/23   [provider]  naloxone [facility]) nasal spray 4 mg/0.1 mL Place 1 spray into the nose as directed. Patient not taking: Reported on 07/03/2024 05/23/24   [provider]  ondansetron  (ZOFRAN ) 4 MG tablet Take 1 tablet (4 mg total) by mouth every 8 (eight) hours as needed for nausea or vomiting. Patient not taking: Reported on 07/03/2024 11/01/22   Towana Ozell BROCKS, MD  tiZANidine  (ZANAFLEX ) 4 MG tablet Take 1 tablet (4 mg total) by mouth 3 (three) times daily as needed for muscle spasms. Patient not taking: Reported on 07/03/2024 07/31/23   Henderly, Britni A, PA-C  TUMS E-X 750 750 MG chewable tablet Chew 1 tablet by mouth 2 (two) times daily as needed for heartburn. Patient not taking: Reported on 07/03/2024    [provider]    Physical Exam: Vitals:   07/04/24 2009 07/04/24 2351 07/05/24 0357 07/05/24 0830  BP: (!) 161/90 (!) 162/93 (!) 146/89 136/76  Pulse: (!) 57 63 (!) 53 (!) 56  Resp: 18 16 16 16   Temp: 98.1 F (36.7 C) 98.3 F (36.8 C) 98.3 F (36.8 C) 98.5 F (36.9 C)  TempSrc: Oral Oral Oral Oral  SpO2: 99% 97% 94% 98%  Weight:      Height:       General exam: In no acute distress. Respiratory system: Good air movement and clear to auscultation. Cardiovascular system: S1 & S2 heard, RRR. No JVD. Gastrointestinal system: Abdomen is nondistended, soft and nontender.  Extremities: No pedal edema. Skin: No rashes, lesions or ulcers Psychiatry: Judgement and insight appear normal. Mood & affect appropriate. Data Reviewed:   There are no new results to review at this time.    Family Communication:  wife Primary team communication: Neurosurgery Thank you very much for involving us  in the care of your patient.  Author: Erle Odell Castor, MD 07/05/2024 11:17 AM  For on call review [email or web address].

## 2024-07-05 NOTE — Progress Notes (Signed)
 Subjective: Patient reports overall patient reports symptoms stable  Objective: Vital signs in last 24 hours: Temp:  [97.9 F (36.6 C)-98.5 F (36.9 C)] 97.9 F (36.6 C) (08/01 1631) Pulse Rate:  [45-63] 54 (08/01 1631) Resp:  [16-18] 18 (08/01 1631) BP: (118-162)/(74-93) 128/74 (08/01 1631) SpO2:  [94 %-100 %] 100 % (08/01 1631)  Intake/Output from previous day: 07/31 0701 - 08/01 0700 In: -  Out: 500 [Urine:500] Intake/Output this shift: Total I/O In: 480 [P.O.:480] Out: -   Awake alert strength seems to be 4+ out of 5 deltoid tricep otherwise 5 out of 5  Lab Results: Recent Labs    07/03/24 1634 07/03/24 1645  WBC 8.8  --   HGB 17.6* 18.4*  HCT 52.3* 54.0*  PLT 230  --    BMET Recent Labs    07/03/24 1634 07/03/24 1645  NA 137 138  K 4.2 4.0  CL 107 106  CO2 20*  --   GLUCOSE 163* 166*  BUN 7 7  CREATININE 0.64 0.50*  CALCIUM  8.9  --     Studies/Results: MR BRAIN WO CONTRAST Result Date: 07/03/2024 CLINICAL DATA:  Cervical radiculopathy, no red flags; Neuro deficit, acute, stroke suspected; Low back pain, cauda equina syndrome suspected; Myelopathy, acute, thoracic spine. EXAM: MRI CERVICAL, THORACIC AND LUMBAR SPINE WITHOUT CONTRAST TECHNIQUE: Multiplanar and multiecho pulse sequences of the cervical spine, to include the craniocervical junction and cervicothoracic junction, and thoracic and lumbar spine, were obtained without intravenous contrast. COMPARISON:  None Available. FINDINGS: MRI HEAD FINDINGS Brain: No acute infarction, hemorrhage, hydrocephalus, extra-axial collection or mass lesion. The a benign right basal ganglia dilated perivascular space. Vascular: Major arterial flow voids are maintained at the skull base. Skull and upper cervical spine: Normal marrow signal. Sinuses/Orbits: Paranasal sinus mucosal thickening with air-fluid levels in the sphenoid sinuses. Other: No mastoid effusions MRI CERVICAL SPINE FINDINGS Alignment: No substantial  sagittal subluxation. Vertebrae: No fracture, evidence of discitis, or bone lesion. Cord: Normal cord signal. Posterior Fossa, vertebral arteries, paraspinal tissues: Visualized vertebral artery flow voids are maintained. Disc levels: C2-C3: Bilateral facet and uncovertebral hypertrophy with mild left greater than right foraminal stenosis. Patent canal. C3-C4: Bilateral facet and uncovertebral hypertrophy with moderate left greater than right foraminal stenosis. Mild canal stenosis. C4-C5: Posterior disc osteophyte complex with bilateral facet and uncovertebral hypertrophy. Resulting severe bilateral foraminal stenosis and moderate canal stenosis. C5-C6: Posterior disc osteophyte complex with left greater than right facet and uncovertebral hypertrophy. Resulting moderate to severe bilateral foraminal stenosis. Mild canal stenosis. C6-C7: Posterior disc osteophyte complex with bilateral facet and uncovertebral hypertrophy. Resulting mild right foraminal stenosis. Mild canal stenosis. C7-T1: No significant disc protrusion, foraminal stenosis, or canal stenosis. MRI THORACIC SPINE FINDINGS Alignment:  Normal. Vertebrae: No fracture, evidence of discitis, or bone lesion. Cord:  Normal cord signal. Paraspinal and other soft tissues: Unremarkable. Disc levels: Central disc protrusion at T7-T8 contacts and flattens the ventral cord without significant canal stenosis. Multilevel facet arthropathy with moderate left and mild right foraminal stenosis at T9-T10. MRI LUMBAR SPINE FINDINGS Segmentation: Standard. Alignment:  No substantial sagittal subluxation. Vertebrae:  No fracture, evidence of discitis, or bone lesion. Conus medullaris and cauda equina: Conus extends to the L1-L2 level. Conus and cauda equina appear normal. Paraspinal and other soft tissues: Unremarkable. Disc levels: T12-L1: No significant disc protrusion, foraminal stenosis, or canal stenosis. L1-L2: No significant disc protrusion, foraminal stenosis, or  canal stenosis. L2-L3: Mild disc bulging with mild canal stenosis. No significant foraminal stenosis. L3-L4:  Broad disc bulging and bilateral endplate spurring with facet arthropathy. Resulting mild left greater than right foraminal stenosis. Mild canal stenosis. L4-L5: Broad disc bulge with bilateral facet arthropathy. Resulting mild canal and bilateral foraminal stenosis. L5-S1: Disc bulging endplate spurring. Right greater than left facet arthropathy. Resulting moderate right foraminal stenosis with far lateral disc/osteophyte closely approximating the exiting/exited right L5 nerve. Mild left foraminal stenosis. Patent canal. IMPRESSION: MRI HEAD: No evidence of acute intracranial abnormality. MRI CERVICAL SPINE: 1. At C4-C5, severe bilateral foraminal stenosis and moderate canal stenosis. 2. At C5-C6, moderate to severe bilateral foraminal stenosis and mild canal stenosis. 3. At C3-C4, moderate left greater than right foraminal stenosis and mild canal stenosis. MRI THORACIC SPINE: 1. At T7-T8, central disc protrusion contacts and flattens the ventral cord without significant canal stenosis. 2. At T9-T10, moderate left and mild right foraminal stenosis. MRI LUMBAR SPINE: 1. At L5-S1, moderate right foraminal stenosis with far lateral disc/osteophyte closely approximating the exiting/exited right L5 nerve. 2. At L3-L4 and L4-L5, mild canal and bilateral foraminal stenosis. Electronically Signed   By: Gilmore GORMAN Molt M.D.   On: 07/03/2024 20:38   MR Cervical Spine Wo Contrast Result Date: 07/03/2024 CLINICAL DATA:  Cervical radiculopathy, no red flags; Neuro deficit, acute, stroke suspected; Low back pain, cauda equina syndrome suspected; Myelopathy, acute, thoracic spine. EXAM: MRI CERVICAL, THORACIC AND LUMBAR SPINE WITHOUT CONTRAST TECHNIQUE: Multiplanar and multiecho pulse sequences of the cervical spine, to include the craniocervical junction and cervicothoracic junction, and thoracic and lumbar spine,  were obtained without intravenous contrast. COMPARISON:  None Available. FINDINGS: MRI HEAD FINDINGS Brain: No acute infarction, hemorrhage, hydrocephalus, extra-axial collection or mass lesion. The a benign right basal ganglia dilated perivascular space. Vascular: Major arterial flow voids are maintained at the skull base. Skull and upper cervical spine: Normal marrow signal. Sinuses/Orbits: Paranasal sinus mucosal thickening with air-fluid levels in the sphenoid sinuses. Other: No mastoid effusions MRI CERVICAL SPINE FINDINGS Alignment: No substantial sagittal subluxation. Vertebrae: No fracture, evidence of discitis, or bone lesion. Cord: Normal cord signal. Posterior Fossa, vertebral arteries, paraspinal tissues: Visualized vertebral artery flow voids are maintained. Disc levels: C2-C3: Bilateral facet and uncovertebral hypertrophy with mild left greater than right foraminal stenosis. Patent canal. C3-C4: Bilateral facet and uncovertebral hypertrophy with moderate left greater than right foraminal stenosis. Mild canal stenosis. C4-C5: Posterior disc osteophyte complex with bilateral facet and uncovertebral hypertrophy. Resulting severe bilateral foraminal stenosis and moderate canal stenosis. C5-C6: Posterior disc osteophyte complex with left greater than right facet and uncovertebral hypertrophy. Resulting moderate to severe bilateral foraminal stenosis. Mild canal stenosis. C6-C7: Posterior disc osteophyte complex with bilateral facet and uncovertebral hypertrophy. Resulting mild right foraminal stenosis. Mild canal stenosis. C7-T1: No significant disc protrusion, foraminal stenosis, or canal stenosis. MRI THORACIC SPINE FINDINGS Alignment:  Normal. Vertebrae: No fracture, evidence of discitis, or bone lesion. Cord:  Normal cord signal. Paraspinal and other soft tissues: Unremarkable. Disc levels: Central disc protrusion at T7-T8 contacts and flattens the ventral cord without significant canal stenosis.  Multilevel facet arthropathy with moderate left and mild right foraminal stenosis at T9-T10. MRI LUMBAR SPINE FINDINGS Segmentation: Standard. Alignment:  No substantial sagittal subluxation. Vertebrae:  No fracture, evidence of discitis, or bone lesion. Conus medullaris and cauda equina: Conus extends to the L1-L2 level. Conus and cauda equina appear normal. Paraspinal and other soft tissues: Unremarkable. Disc levels: T12-L1: No significant disc protrusion, foraminal stenosis, or canal stenosis. L1-L2: No significant disc protrusion, foraminal stenosis, or canal stenosis. L2-L3: Mild disc  bulging with mild canal stenosis. No significant foraminal stenosis. L3-L4: Broad disc bulging and bilateral endplate spurring with facet arthropathy. Resulting mild left greater than right foraminal stenosis. Mild canal stenosis. L4-L5: Broad disc bulge with bilateral facet arthropathy. Resulting mild canal and bilateral foraminal stenosis. L5-S1: Disc bulging endplate spurring. Right greater than left facet arthropathy. Resulting moderate right foraminal stenosis with far lateral disc/osteophyte closely approximating the exiting/exited right L5 nerve. Mild left foraminal stenosis. Patent canal. IMPRESSION: MRI HEAD: No evidence of acute intracranial abnormality. MRI CERVICAL SPINE: 1. At C4-C5, severe bilateral foraminal stenosis and moderate canal stenosis. 2. At C5-C6, moderate to severe bilateral foraminal stenosis and mild canal stenosis. 3. At C3-C4, moderate left greater than right foraminal stenosis and mild canal stenosis. MRI THORACIC SPINE: 1. At T7-T8, central disc protrusion contacts and flattens the ventral cord without significant canal stenosis. 2. At T9-T10, moderate left and mild right foraminal stenosis. MRI LUMBAR SPINE: 1. At L5-S1, moderate right foraminal stenosis with far lateral disc/osteophyte closely approximating the exiting/exited right L5 nerve. 2. At L3-L4 and L4-L5, mild canal and bilateral  foraminal stenosis. Electronically Signed   By: Gilmore GORMAN Molt M.D.   On: 07/03/2024 20:38   MR LUMBAR SPINE WO CONTRAST Result Date: 07/03/2024 CLINICAL DATA:  Cervical radiculopathy, no red flags; Neuro deficit, acute, stroke suspected; Low back pain, cauda equina syndrome suspected; Myelopathy, acute, thoracic spine. EXAM: MRI CERVICAL, THORACIC AND LUMBAR SPINE WITHOUT CONTRAST TECHNIQUE: Multiplanar and multiecho pulse sequences of the cervical spine, to include the craniocervical junction and cervicothoracic junction, and thoracic and lumbar spine, were obtained without intravenous contrast. COMPARISON:  None Available. FINDINGS: MRI HEAD FINDINGS Brain: No acute infarction, hemorrhage, hydrocephalus, extra-axial collection or mass lesion. The a benign right basal ganglia dilated perivascular space. Vascular: Major arterial flow voids are maintained at the skull base. Skull and upper cervical spine: Normal marrow signal. Sinuses/Orbits: Paranasal sinus mucosal thickening with air-fluid levels in the sphenoid sinuses. Other: No mastoid effusions MRI CERVICAL SPINE FINDINGS Alignment: No substantial sagittal subluxation. Vertebrae: No fracture, evidence of discitis, or bone lesion. Cord: Normal cord signal. Posterior Fossa, vertebral arteries, paraspinal tissues: Visualized vertebral artery flow voids are maintained. Disc levels: C2-C3: Bilateral facet and uncovertebral hypertrophy with mild left greater than right foraminal stenosis. Patent canal. C3-C4: Bilateral facet and uncovertebral hypertrophy with moderate left greater than right foraminal stenosis. Mild canal stenosis. C4-C5: Posterior disc osteophyte complex with bilateral facet and uncovertebral hypertrophy. Resulting severe bilateral foraminal stenosis and moderate canal stenosis. C5-C6: Posterior disc osteophyte complex with left greater than right facet and uncovertebral hypertrophy. Resulting moderate to severe bilateral foraminal stenosis.  Mild canal stenosis. C6-C7: Posterior disc osteophyte complex with bilateral facet and uncovertebral hypertrophy. Resulting mild right foraminal stenosis. Mild canal stenosis. C7-T1: No significant disc protrusion, foraminal stenosis, or canal stenosis. MRI THORACIC SPINE FINDINGS Alignment:  Normal. Vertebrae: No fracture, evidence of discitis, or bone lesion. Cord:  Normal cord signal. Paraspinal and other soft tissues: Unremarkable. Disc levels: Central disc protrusion at T7-T8 contacts and flattens the ventral cord without significant canal stenosis. Multilevel facet arthropathy with moderate left and mild right foraminal stenosis at T9-T10. MRI LUMBAR SPINE FINDINGS Segmentation: Standard. Alignment:  No substantial sagittal subluxation. Vertebrae:  No fracture, evidence of discitis, or bone lesion. Conus medullaris and cauda equina: Conus extends to the L1-L2 level. Conus and cauda equina appear normal. Paraspinal and other soft tissues: Unremarkable. Disc levels: T12-L1: No significant disc protrusion, foraminal stenosis, or canal stenosis. L1-L2: No significant  disc protrusion, foraminal stenosis, or canal stenosis. L2-L3: Mild disc bulging with mild canal stenosis. No significant foraminal stenosis. L3-L4: Broad disc bulging and bilateral endplate spurring with facet arthropathy. Resulting mild left greater than right foraminal stenosis. Mild canal stenosis. L4-L5: Broad disc bulge with bilateral facet arthropathy. Resulting mild canal and bilateral foraminal stenosis. L5-S1: Disc bulging endplate spurring. Right greater than left facet arthropathy. Resulting moderate right foraminal stenosis with far lateral disc/osteophyte closely approximating the exiting/exited right L5 nerve. Mild left foraminal stenosis. Patent canal. IMPRESSION: MRI HEAD: No evidence of acute intracranial abnormality. MRI CERVICAL SPINE: 1. At C4-C5, severe bilateral foraminal stenosis and moderate canal stenosis. 2. At C5-C6,  moderate to severe bilateral foraminal stenosis and mild canal stenosis. 3. At C3-C4, moderate left greater than right foraminal stenosis and mild canal stenosis. MRI THORACIC SPINE: 1. At T7-T8, central disc protrusion contacts and flattens the ventral cord without significant canal stenosis. 2. At T9-T10, moderate left and mild right foraminal stenosis. MRI LUMBAR SPINE: 1. At L5-S1, moderate right foraminal stenosis with far lateral disc/osteophyte closely approximating the exiting/exited right L5 nerve. 2. At L3-L4 and L4-L5, mild canal and bilateral foraminal stenosis. Electronically Signed   By: Gilmore GORMAN Molt M.D.   On: 07/03/2024 20:38   MR THORACIC SPINE WO CONTRAST Result Date: 07/03/2024 CLINICAL DATA:  Cervical radiculopathy, no red flags; Neuro deficit, acute, stroke suspected; Low back pain, cauda equina syndrome suspected; Myelopathy, acute, thoracic spine. EXAM: MRI CERVICAL, THORACIC AND LUMBAR SPINE WITHOUT CONTRAST TECHNIQUE: Multiplanar and multiecho pulse sequences of the cervical spine, to include the craniocervical junction and cervicothoracic junction, and thoracic and lumbar spine, were obtained without intravenous contrast. COMPARISON:  None Available. FINDINGS: MRI HEAD FINDINGS Brain: No acute infarction, hemorrhage, hydrocephalus, extra-axial collection or mass lesion. The a benign right basal ganglia dilated perivascular space. Vascular: Major arterial flow voids are maintained at the skull base. Skull and upper cervical spine: Normal marrow signal. Sinuses/Orbits: Paranasal sinus mucosal thickening with air-fluid levels in the sphenoid sinuses. Other: No mastoid effusions MRI CERVICAL SPINE FINDINGS Alignment: No substantial sagittal subluxation. Vertebrae: No fracture, evidence of discitis, or bone lesion. Cord: Normal cord signal. Posterior Fossa, vertebral arteries, paraspinal tissues: Visualized vertebral artery flow voids are maintained. Disc levels: C2-C3: Bilateral facet  and uncovertebral hypertrophy with mild left greater than right foraminal stenosis. Patent canal. C3-C4: Bilateral facet and uncovertebral hypertrophy with moderate left greater than right foraminal stenosis. Mild canal stenosis. C4-C5: Posterior disc osteophyte complex with bilateral facet and uncovertebral hypertrophy. Resulting severe bilateral foraminal stenosis and moderate canal stenosis. C5-C6: Posterior disc osteophyte complex with left greater than right facet and uncovertebral hypertrophy. Resulting moderate to severe bilateral foraminal stenosis. Mild canal stenosis. C6-C7: Posterior disc osteophyte complex with bilateral facet and uncovertebral hypertrophy. Resulting mild right foraminal stenosis. Mild canal stenosis. C7-T1: No significant disc protrusion, foraminal stenosis, or canal stenosis. MRI THORACIC SPINE FINDINGS Alignment:  Normal. Vertebrae: No fracture, evidence of discitis, or bone lesion. Cord:  Normal cord signal. Paraspinal and other soft tissues: Unremarkable. Disc levels: Central disc protrusion at T7-T8 contacts and flattens the ventral cord without significant canal stenosis. Multilevel facet arthropathy with moderate left and mild right foraminal stenosis at T9-T10. MRI LUMBAR SPINE FINDINGS Segmentation: Standard. Alignment:  No substantial sagittal subluxation. Vertebrae:  No fracture, evidence of discitis, or bone lesion. Conus medullaris and cauda equina: Conus extends to the L1-L2 level. Conus and cauda equina appear normal. Paraspinal and other soft tissues: Unremarkable. Disc levels: T12-L1: No significant  disc protrusion, foraminal stenosis, or canal stenosis. L1-L2: No significant disc protrusion, foraminal stenosis, or canal stenosis. L2-L3: Mild disc bulging with mild canal stenosis. No significant foraminal stenosis. L3-L4: Broad disc bulging and bilateral endplate spurring with facet arthropathy. Resulting mild left greater than right foraminal stenosis. Mild canal  stenosis. L4-L5: Broad disc bulge with bilateral facet arthropathy. Resulting mild canal and bilateral foraminal stenosis. L5-S1: Disc bulging endplate spurring. Right greater than left facet arthropathy. Resulting moderate right foraminal stenosis with far lateral disc/osteophyte closely approximating the exiting/exited right L5 nerve. Mild left foraminal stenosis. Patent canal. IMPRESSION: MRI HEAD: No evidence of acute intracranial abnormality. MRI CERVICAL SPINE: 1. At C4-C5, severe bilateral foraminal stenosis and moderate canal stenosis. 2. At C5-C6, moderate to severe bilateral foraminal stenosis and mild canal stenosis. 3. At C3-C4, moderate left greater than right foraminal stenosis and mild canal stenosis. MRI THORACIC SPINE: 1. At T7-T8, central disc protrusion contacts and flattens the ventral cord without significant canal stenosis. 2. At T9-T10, moderate left and mild right foraminal stenosis. MRI LUMBAR SPINE: 1. At L5-S1, moderate right foraminal stenosis with far lateral disc/osteophyte closely approximating the exiting/exited right L5 nerve. 2. At L3-L4 and L4-L5, mild canal and bilateral foraminal stenosis. Electronically Signed   By: Gilmore GORMAN Molt M.D.   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  Continue observe over the weekend physical occupational therapy.  LOS: 2 days     Theodore Hinton 07/05/2024, 6:22 PM

## 2024-07-06 LAB — GLUCOSE, CAPILLARY
Glucose-Capillary: 172 mg/dL — ABNORMAL HIGH (ref 70–99)
Glucose-Capillary: 91 mg/dL (ref 70–99)
Glucose-Capillary: 188 mg/dL — ABNORMAL HIGH (ref 70–99)
Glucose-Capillary: 98 mg/dL (ref 70–99)

## 2024-07-06 MED ORDER — PANTOPRAZOLE SODIUM 40 MG PO TBEC
40.0000 mg | DELAYED_RELEASE_TABLET | Freq: Every day | ORAL | Status: DC
  Administered 2024-07-06 – 2024-07-07 (×2): 40 mg via ORAL
  Filled 2024-07-06 (×2): qty 1

## 2024-07-06 MED ORDER — INSULIN GLARGINE-YFGN 100 UNIT/ML ~~LOC~~ SOLN
5.0000 [IU] | Freq: Every day | SUBCUTANEOUS | Status: DC
  Administered 2024-07-06 – 2024-07-07 (×2): 5 [IU] via SUBCUTANEOUS
  Filled 2024-07-06 (×4): qty 0.05

## 2024-07-06 NOTE — Progress Notes (Signed)
 Patient ID: Theodore Hinton, male   DOB: 05-06-1966, 58 y.o.   MRN: [id number] Looks good, some headache, moving arms and legs well, surgery monday

## 2024-07-06 NOTE — Progress Notes (Addendum)
 Pt refused bed alarmed on. RN explained the reason of pt's hx of fall prior admission that made RN had to follow hospital protocol. However, pt adamantly refused. Pt stated that he would call/press call bell if he experienced dizziness or lighted headed when getting up.   Amado GORMAN Arabia, RN

## 2024-07-07 DIAGNOSIS — G959 Disease of spinal cord, unspecified: Secondary | ICD-10-CM | POA: Diagnosis not present

## 2024-07-07 LAB — GLUCOSE, CAPILLARY
Glucose-Capillary: 130 mg/dL — ABNORMAL HIGH (ref 70–99)
Glucose-Capillary: 128 mg/dL — ABNORMAL HIGH (ref 70–99)
Glucose-Capillary: 212 mg/dL — ABNORMAL HIGH (ref 70–99)
Glucose-Capillary: 133 mg/dL — ABNORMAL HIGH (ref 70–99)

## 2024-07-07 LAB — ABO/RH: ABO/RH(D): A POS

## 2024-07-07 MED ORDER — DOCUSATE SODIUM 100 MG PO CAPS
100.0000 mg | ORAL_CAPSULE | Freq: Every day | ORAL | Status: DC
  Administered 2024-07-07 – 2024-07-11 (×5): 100 mg via ORAL
  Filled 2024-07-07 (×5): qty 1

## 2024-07-07 NOTE — Progress Notes (Signed)
    Providing Compassionate, Quality Care - Together   NEUROSURGERY PROGRESS NOTE     S: No issues overnight.    O: EXAM:  BP 118/83 (BP Location: Left Arm)   Pulse (!) 56   Temp 97.8 F (36.6 C) (Oral)   Resp 16   Ht 5' 8 (1.727 m)   Wt 83 kg   SpO2 100%   BMI 27.82 kg/m     Awake, alert, oriented  Speech fluent, appropriate  RUE 5/5, LUE 4/5 tricep RLE 5/5, LLE 4/5 DF/TA SILTx4   ASSESSMENT:  58 y.o. with cervical spondylitic myelopathy    PLAN: -OR tmrw for ACDF  -Supportive care -Therapies as tolerated -Call w/ questions/concerns.   Camie Pickle, [facility]

## 2024-07-07 NOTE — Progress Notes (Signed)
 [facility] CONSULT PROGRESS NOTE    Progress Note  Theodore Hinton  FMW:[id number] DOB: [birth year] DOA: 07/03/2024 PCP: [facility]     Brief Narrative:   Theodore Hinton is an 58 y.o. 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  Controlled type 2 diabetes mellitus without complication, without long-term current use of insulin  (HCC) A1c of 7.6, started on long-acting insulin  blood glucose well-controlled. Continue current management n.p.o. after midnight.  Essential HTN (hypertension) Continue metoprolol  and Norvasc  and lisinopril .  Blood pressure slightly elevated this morning previously has been well-controlled  DVT prophylaxis: lovenox  Family Communication:none Status is: Observation The patient remains OBS appropriate and will d/c before 2 midnights.    Code Status:     Code Status Orders  (From admission, onward)           Start     Ordered   07/03/24 2317  Full code  Continuous       Question:  By:  Answer:  Consent: discussion documented in EHR   07/03/24 2316           Code Status History     Date Active Date Inactive Code Status Order ID Comments User Context   12/19/2022 1905 12/20/2022 2058 Full Code [id number]  Laurita Cort DASEN, MD ED   06/18/2022 2149 06/20/2022 1812 Full Code [id number]  Michaela Aisha SQUIBB, MD Inpatient         IV Access:   Peripheral IV   Procedures and  diagnostic studies:   No results found.   Medical Consultants:   None.   Subjective:    Theodore Hinton no complaints today  Objective:    Vitals:   07/06/24 1645 07/06/24 2046 07/07/24 0038 07/07/24 0507  BP: 126/76 (!) 141/76 133/73 (!) 156/89  Pulse: (!) 55 (!) 51 (!) 49 (!) 50  Resp: 18 18 18 18   Temp: 98.4 F (36.9 C) 98.1 F (36.7 C) 97.8 F (36.6 C) 98.2 F (36.8 C)  TempSrc: Oral Oral Oral Oral  SpO2: 97% 98% 98% 99%  Weight:      Height:       SpO2: 99 %   Intake/Output Summary (Last 24 hours) at 07/07/2024 0649 Last data filed at 07/07/2024 0000 Gross per 24 hour  Intake 720 ml  Output 750 ml  Net -30 ml   Filed Weights   07/03/24 1549  Weight: 83 kg    Exam: General exam: In no acute distress. Respiratory system: Good air movement and clear to auscultation. Cardiovascular system: S1 & S2 heard, RRR. No JVD. Gastrointestinal system: Abdomen is nondistended, soft and nontender.  Extremities: No pedal edema. Skin: No rashes, lesions or ulcers Psychiatry: Judgement and insight appear normal. Mood & affect appropriate.  Data Reviewed:    Labs: Basic Metabolic Panel: Recent Labs  Lab 07/03/24 1634 07/03/24 1645  NA 137 138  K 4.2 4.0  CL 107 106  CO2 20*  --   GLUCOSE 163* 166*  BUN 7 7  CREATININE 0.64  0.50*  CALCIUM  8.9  --    GFR Estimated Creatinine Clearance: 104.3 mL/min (A) (by C-G formula based on SCr of 0.5 mg/dL (L)). Liver Function Tests: Recent Labs  Lab 07/03/24 1634  AST 25  ALT 16  ALKPHOS 64  BILITOT 1.9*  PROT 6.1*  ALBUMIN 3.4*   No results for input(s): LIPASE, AMYLASE in the last 168 hours. No results for input(s): AMMONIA in the last 168 hours. Coagulation profile Recent Labs  Lab 07/03/24 1634  INR 1.0   COVID-19 Labs  No results for input(s): DDIMER, FERRITIN, LDH, CRP in the last 72 hours.  Lab Results  Component Value Date   SARSCOV2NAA NEGATIVE 01/09/2023   SARSCOV2NAA  NEGATIVE 06/18/2022    CBC: Recent Labs  Lab 07/03/24 1634 07/03/24 1645  WBC 8.8  --   NEUTROABS 6.0  --   HGB 17.6* 18.4*  HCT 52.3* 54.0*  MCV 91.0  --   PLT 230  --    Cardiac Enzymes: No results for input(s): CKTOTAL, CKMB, CKMBINDEX, TROPONINI in the last 168 hours. BNP (last 3 results) No results for input(s): PROBNP in the last 8760 hours. CBG: Recent Labs  Lab 07/06/24 0628 07/06/24 1210 07/06/24 1642 07/06/24 2129 07/07/24 0614  GLUCAP 172* 91 188* 98 130*   D-Dimer: No results for input(s): DDIMER in the last 72 hours. Hgb A1c: Recent Labs    07/04/24 1558  HGBA1C 7.6*   Lipid Profile: No results for input(s): CHOL, HDL, LDLCALC, TRIG, CHOLHDL, LDLDIRECT in the last 72 hours. Thyroid function studies: No results for input(s): TSH, T4TOTAL, T3FREE, THYROIDAB in the last 72 hours.  Invalid input(s): FREET3 Anemia work up: No results for input(s): VITAMINB12, FOLATE, FERRITIN, TIBC, IRON, RETICCTPCT in the last 72 hours. Sepsis Labs: Recent Labs  Lab 07/03/24 1634  WBC 8.8   Microbiology Recent Results (from the past 240 hours)  Surgical pcr screen     Status: None   Collection Time: 07/05/24  2:08 PM   Specimen: Nasal Mucosa; Nasal Swab  Result Value Ref Range Status   MRSA, PCR NEGATIVE NEGATIVE Final   Staphylococcus aureus NEGATIVE NEGATIVE Final    Comment: (NOTE) The Xpert SA Assay (FDA approved for NASAL specimens in patients 70 years of age and older), is one component of a comprehensive surveillance program. It is not intended to diagnose infection nor to guide or monitor treatment. Performed at [facility], [address] [place], [place] [address]      Medications:    amLODipine   10 mg Oral Daily   atorvastatin   80 mg Oral Daily   ezetimibe   10 mg Oral Daily   gabapentin   300 mg Oral TID   insulin  aspart  0-15 Units Subcutaneous TID WC   insulin  aspart  0-5 Units  Subcutaneous QHS   insulin  aspart  4 Units Subcutaneous TID WC   insulin  glargine-yfgn  5 Units Subcutaneous Q2200   lamoTRIgine   25 mg Oral Daily   lisinopril   20 mg Oral Daily   metoprolol  tartrate  50 mg Oral BID   pantoprazole   40 mg Oral Q supper   Continuous Infusions:    LOS: 3 days   Theodore Hinton  [facility]  07/07/2024, 6:49 AM

## 2024-07-08 ENCOUNTER — Observation Stay (HOSPITAL_COMMUNITY): Admitting: Anesthesiology

## 2024-07-08 ENCOUNTER — Encounter (HOSPITAL_COMMUNITY): Payer: Self-pay | Admitting: Neurosurgery

## 2024-07-08 ENCOUNTER — Observation Stay (HOSPITAL_COMMUNITY)

## 2024-07-08 ENCOUNTER — Other Ambulatory Visit: Payer: Self-pay

## 2024-07-08 ENCOUNTER — Inpatient Hospital Stay (HOSPITAL_COMMUNITY)
Admission: EM | Disposition: A | Discharge status: Home / Self Care | Payer: Self-pay | Source: Home / Self Care | Attending: Neurosurgery

## 2024-07-08 DIAGNOSIS — I679 Cerebrovascular disease, unspecified: Secondary | ICD-10-CM

## 2024-07-08 DIAGNOSIS — E119 Type 2 diabetes mellitus without complications: Secondary | ICD-10-CM | POA: Diagnosis not present

## 2024-07-08 DIAGNOSIS — I10 Essential (primary) hypertension: Secondary | ICD-10-CM

## 2024-07-08 DIAGNOSIS — M4802 Spinal stenosis, cervical region: Secondary | ICD-10-CM

## 2024-07-08 HISTORY — PX: ANTERIOR CERVICAL DECOMP/DISCECTOMY FUSION: SHX1161

## 2024-07-08 LAB — GLUCOSE, CAPILLARY
Glucose-Capillary: 144 mg/dL — ABNORMAL HIGH (ref 70–99)
Glucose-Capillary: 89 mg/dL (ref 70–99)
Glucose-Capillary: 94 mg/dL (ref 70–99)
Glucose-Capillary: 93 mg/dL (ref 70–99)
Glucose-Capillary: 88 mg/dL (ref 70–99)
Glucose-Capillary: 169 mg/dL — ABNORMAL HIGH (ref 70–99)

## 2024-07-08 LAB — TYPE AND SCREEN
ABO/RH(D): A POS
Antibody Screen: NEGATIVE

## 2024-07-08 SURGERY — ANTERIOR CERVICAL DECOMPRESSION/DISCECTOMY FUSION 3 LEVELS
Anesthesia: General | Site: Neck

## 2024-07-08 MED ORDER — ROCURONIUM BROMIDE 10 MG/ML (PF) SYRINGE
PREFILLED_SYRINGE | INTRAVENOUS | Status: DC | PRN
  Administered 2024-07-08 (×2): 20 mg via INTRAVENOUS
  Administered 2024-07-08: 60 mg via INTRAVENOUS

## 2024-07-08 MED ORDER — PHENYLEPHRINE 80 MCG/ML (10ML) SYRINGE FOR IV PUSH (FOR BLOOD PRESSURE SUPPORT)
PREFILLED_SYRINGE | INTRAVENOUS | Status: DC | PRN
  Administered 2024-07-08: 80 ug via INTRAVENOUS

## 2024-07-08 MED ORDER — CHLORHEXIDINE GLUCONATE 0.12 % MT SOLN
OROMUCOSAL | Status: AC
  Administered 2024-07-08: 15 mL via OROMUCOSAL
  Filled 2024-07-08: qty 15

## 2024-07-08 MED ORDER — THROMBIN 5000 UNITS EX SOLR: OROMUCOSAL | Status: DC | PRN

## 2024-07-08 MED ORDER — ONDANSETRON HCL 4 MG/2ML IJ SOLN: 4.0000 mg | Freq: Once | INTRAMUSCULAR | Status: DC | PRN

## 2024-07-08 MED ORDER — THROMBIN 5000 UNITS EX KIT
PACK | CUTANEOUS | Status: AC
  Filled 2024-07-08: qty 1

## 2024-07-08 MED ORDER — OXYCODONE HCL 5 MG/5ML PO SOLN: 5.0000 mg | Freq: Once | ORAL | Status: DC | PRN

## 2024-07-08 MED ORDER — FENTANYL CITRATE (PF) 100 MCG/2ML IJ SOLN
25.0000 ug | INTRAMUSCULAR | Status: DC | PRN
  Administered 2024-07-08 (×2): 50 ug via INTRAVENOUS

## 2024-07-08 MED ORDER — FENTANYL CITRATE (PF) 250 MCG/5ML IJ SOLN
INTRAMUSCULAR | Status: DC | PRN
  Administered 2024-07-08 (×5): 50 ug via INTRAVENOUS

## 2024-07-08 MED ORDER — DEXAMETHASONE SODIUM PHOSPHATE 10 MG/ML IJ SOLN
INTRAMUSCULAR | Status: DC | PRN
  Administered 2024-07-08: 10 mg via INTRAVENOUS

## 2024-07-08 MED ORDER — ROCURONIUM BROMIDE 10 MG/ML (PF) SYRINGE
PREFILLED_SYRINGE | INTRAVENOUS | Status: AC
Start: 2024-07-08 — End: 2024-07-08
  Filled 2024-07-08: qty 10

## 2024-07-08 MED ORDER — EPHEDRINE 5 MG/ML INJ
INTRAVENOUS | Status: AC
  Filled 2024-07-08: qty 5

## 2024-07-08 MED ORDER — LACTATED RINGERS IV SOLN: INTRAVENOUS | Status: DC

## 2024-07-08 MED ORDER — HYDROMORPHONE HCL 1 MG/ML IJ SOLN
INTRAMUSCULAR | Status: AC
  Filled 2024-07-08: qty 0.5

## 2024-07-08 MED ORDER — THROMBIN 20000 UNITS EX SOLR
CUTANEOUS | Status: AC
  Filled 2024-07-08: qty 20000

## 2024-07-08 MED ORDER — CEFAZOLIN SODIUM 1 G IJ SOLR
INTRAMUSCULAR | Status: AC
  Filled 2024-07-08: qty 20

## 2024-07-08 MED ORDER — PROPOFOL 500 MG/50ML IV EMUL
INTRAVENOUS | Status: DC | PRN
Start: 2024-07-08 — End: 2024-07-08
  Administered 2024-07-08: 35 ug/kg/min via INTRAVENOUS

## 2024-07-08 MED ORDER — PHENYLEPHRINE 80 MCG/ML (10ML) SYRINGE FOR IV PUSH (FOR BLOOD PRESSURE SUPPORT)
PREFILLED_SYRINGE | INTRAVENOUS | Status: AC
Start: 2024-07-08 — End: 2024-07-08
  Filled 2024-07-08: qty 10

## 2024-07-08 MED ORDER — CHLORHEXIDINE GLUCONATE 0.12 % MT SOLN: 15.0000 mL | Freq: Once | OROMUCOSAL | Status: AC

## 2024-07-08 MED ORDER — PHENYLEPHRINE 80 MCG/ML (10ML) SYRINGE FOR IV PUSH (FOR BLOOD PRESSURE SUPPORT)
PREFILLED_SYRINGE | INTRAVENOUS | Status: AC
  Filled 2024-07-08: qty 10

## 2024-07-08 MED ORDER — INSULIN ASPART 100 UNIT/ML IJ SOLN: 0.0000 [IU] | INTRAMUSCULAR | Status: DC | PRN

## 2024-07-08 MED ORDER — SUCCINYLCHOLINE CHLORIDE 200 MG/10ML IV SOSY
PREFILLED_SYRINGE | INTRAVENOUS | Status: AC
  Filled 2024-07-08: qty 10

## 2024-07-08 MED ORDER — ONDANSETRON HCL 4 MG/2ML IJ SOLN
INTRAMUSCULAR | Status: DC | PRN
  Administered 2024-07-08: 4 mg via INTRAVENOUS

## 2024-07-08 MED ORDER — FENTANYL CITRATE (PF) 250 MCG/5ML IJ SOLN
INTRAMUSCULAR | Status: AC
  Filled 2024-07-08: qty 5

## 2024-07-08 MED ORDER — SUGAMMADEX SODIUM 200 MG/2ML IV SOLN
INTRAVENOUS | Status: DC | PRN
  Administered 2024-07-08: 165 mg via INTRAVENOUS

## 2024-07-08 MED ORDER — HYDROMORPHONE HCL 1 MG/ML IJ SOLN
INTRAMUSCULAR | Status: DC | PRN
  Administered 2024-07-08: .5 mg via INTRAVENOUS

## 2024-07-08 MED ORDER — PROPOFOL 10 MG/ML IV BOLUS
INTRAVENOUS | Status: DC | PRN
  Administered 2024-07-08: 120 mg via INTRAVENOUS

## 2024-07-08 MED ORDER — MIDAZOLAM HCL 2 MG/2ML IJ SOLN
INTRAMUSCULAR | Status: AC
  Filled 2024-07-08: qty 2

## 2024-07-08 MED ORDER — MIDAZOLAM HCL 2 MG/2ML IJ SOLN
INTRAMUSCULAR | Status: DC | PRN
  Administered 2024-07-08: 2 mg via INTRAVENOUS

## 2024-07-08 MED ORDER — ONDANSETRON HCL 4 MG/2ML IJ SOLN
INTRAMUSCULAR | Status: AC
  Filled 2024-07-08: qty 2

## 2024-07-08 MED ORDER — DEXAMETHASONE SODIUM PHOSPHATE 10 MG/ML IJ SOLN
INTRAMUSCULAR | Status: AC
  Filled 2024-07-08: qty 1

## 2024-07-08 MED ORDER — ACETAMINOPHEN 10 MG/ML IV SOLN
1000.0000 mg | Freq: Once | INTRAVENOUS | Status: DC | PRN
  Administered 2024-07-08: 1000 mg via INTRAVENOUS

## 2024-07-08 MED ORDER — OXYCODONE HCL 5 MG PO TABS: 5.0000 mg | ORAL_TABLET | Freq: Once | ORAL | Status: DC | PRN

## 2024-07-08 MED ORDER — THROMBIN 5000 UNITS EX KIT
PACK | CUTANEOUS | Status: AC
Start: 2024-07-08 — End: 2024-07-08
  Filled 2024-07-08: qty 1

## 2024-07-08 MED ORDER — FENTANYL CITRATE (PF) 100 MCG/2ML IJ SOLN
INTRAMUSCULAR | Status: AC
  Filled 2024-07-08: qty 2

## 2024-07-08 MED ORDER — CEFAZOLIN SODIUM-DEXTROSE 2-3 GM-%(50ML) IV SOLR
INTRAVENOUS | Status: DC | PRN
Start: 2024-07-08 — End: 2024-07-08
  Administered 2024-07-08: 2 g via INTRAVENOUS

## 2024-07-08 MED ORDER — THROMBIN 20000 UNITS EX SOLR: CUTANEOUS | Status: DC | PRN

## 2024-07-08 MED ORDER — LIDOCAINE 2% (20 MG/ML) 5 ML SYRINGE
INTRAMUSCULAR | Status: DC | PRN
  Administered 2024-07-08: 60 mg via INTRAVENOUS

## 2024-07-08 MED ORDER — ACETAMINOPHEN 10 MG/ML IV SOLN
INTRAVENOUS | Status: AC
  Filled 2024-07-08: qty 100

## 2024-07-08 MED ORDER — ORAL CARE MOUTH RINSE: 15.0000 mL | Freq: Once | OROMUCOSAL | Status: AC

## 2024-07-08 MED ORDER — 0.9 % SODIUM CHLORIDE (POUR BTL) OPTIME
TOPICAL | Status: DC | PRN
  Administered 2024-07-08: 1000 mL

## 2024-07-08 SURGICAL SUPPLY — 54 items
BAG COUNTER SPONGE SURGICOUNT (BAG) ×1 IMPLANT
BAND RUBBER #18 3X1/16 STRL (MISCELLANEOUS) ×2 IMPLANT
BASKET BONE COLLECTION (BASKET) ×1 IMPLANT
BENZOIN TINCTURE PRP APPL 2/3 (GAUZE/BANDAGES/DRESSINGS) ×1 IMPLANT
BIT DRILL NEURO 2X3.1 SFT TUCH (MISCELLANEOUS) ×1 IMPLANT
BUR MATCHSTICK NEURO 3.0 LAGG (BURR) ×1 IMPLANT
CANISTER SUCTION 3000ML PPV (SUCTIONS) ×1 IMPLANT
DERMABOND ADVANCED .7 DNX12 (GAUZE/BANDAGES/DRESSINGS) ×1 IMPLANT
DERMABOND ADVANCED .7 DNX6 (GAUZE/BANDAGES/DRESSINGS) IMPLANT
DRAPE C-ARM 42X72 X-RAY (DRAPES) ×2 IMPLANT
DRAPE LAPAROTOMY 100X72 PEDS (DRAPES) ×1 IMPLANT
DRAPE MICROSCOPE SLANT 54X150 (MISCELLANEOUS) ×1 IMPLANT
DRSG OPSITE POSTOP 4X6 (GAUZE/BANDAGES/DRESSINGS) IMPLANT
DURAPREP 6ML APPLICATOR 50/CS (WOUND CARE) ×1 IMPLANT
ELECT COATED BLADE 2.86 ST (ELECTRODE) ×1 IMPLANT
ELECTRODE REM PT RTRN 9FT ADLT (ELECTROSURGICAL) ×1 IMPLANT
EVACUATOR SILICONE 100CC (DRAIN) IMPLANT
GAUZE 4X4 16PLY ~~LOC~~+RFID DBL (SPONGE) IMPLANT
GAUZE SPONGE 4X4 12PLY STRL (GAUZE/BANDAGES/DRESSINGS) ×1 IMPLANT
GLOVE BIO SURGEON STRL SZ7 (GLOVE) IMPLANT
GLOVE BIO SURGEON STRL SZ8 (GLOVE) ×1 IMPLANT
GLOVE BIOGEL PI IND STRL 7.0 (GLOVE) IMPLANT
GLOVE BIOGEL PI IND STRL 7.5 (GLOVE) IMPLANT
GLOVE ECLIPSE 6.5 STRL STRAW (GLOVE) IMPLANT
GLOVE EXAM NITRILE XL STR (GLOVE) IMPLANT
GLOVE INDICATOR 8.5 STRL (GLOVE) ×1 IMPLANT
GOWN STRL REUS W/ TWL LRG LVL3 (GOWN DISPOSABLE) ×1 IMPLANT
GOWN STRL REUS W/ TWL XL LVL3 (GOWN DISPOSABLE) ×1 IMPLANT
GOWN STRL REUS W/TWL 2XL LVL3 (GOWN DISPOSABLE) IMPLANT
GRAFT BNE MATRIX VG FRMBL SM 1 (Bone Implant) IMPLANT
HALTER HD/CHIN CERV TRACTION D (MISCELLANEOUS) ×1 IMPLANT
HEMOSTAT POWDER KIT SURGIFOAM (HEMOSTASIS) ×1 IMPLANT
KIT BASIN OR (CUSTOM PROCEDURE TRAY) ×1 IMPLANT
KIT TURNOVER KIT B (KITS) ×1 IMPLANT
NDL SPNL 20GX3.5 QUINCKE YW (NEEDLE) ×1 IMPLANT
NEEDLE SPNL 20GX3.5 QUINCKE YW (NEEDLE) ×1 IMPLANT
NS IRRIG 1000ML POUR BTL (IV SOLUTION) ×1 IMPLANT
PACK LAMINECTOMY NEURO (CUSTOM PROCEDURE TRAY) ×1 IMPLANT
PIN DISTRACTION 14MM (PIN) IMPLANT
PLATE 3 57.5XLCK NS SPNE CVD (Plate) IMPLANT
SCREW BN 13X4XSLF DRL FXANG (Screw) IMPLANT
SCREW ST FIX 4 ATL 3120213 (Screw) IMPLANT
SPACER HEDRON C 12X14X5 0D (Spacer) IMPLANT
SPACER HEDRON C 12X14X6 0D (Spacer) IMPLANT
SPIKE FLUID TRANSFER (MISCELLANEOUS) ×1 IMPLANT
SPONGE INTESTINAL PEANUT (DISPOSABLE) ×1 IMPLANT
SPONGE SURGIFOAM ABS GEL 100 (HEMOSTASIS) ×1 IMPLANT
STRIP CLOSURE SKIN 1/2X4 (GAUZE/BANDAGES/DRESSINGS) ×1 IMPLANT
SUT VIC AB 3-0 SH 8-18 (SUTURE) ×1 IMPLANT
SUT VIC AB 4-0 PS2 27 (SUTURE) ×1 IMPLANT
TAPE CLOTH 4X10 WHT NS (GAUZE/BANDAGES/DRESSINGS) IMPLANT
TOWEL GREEN STERILE (TOWEL DISPOSABLE) ×1 IMPLANT
TOWEL GREEN STERILE FF (TOWEL DISPOSABLE) ×1 IMPLANT
WATER STERILE IRR 1000ML POUR (IV SOLUTION) ×1 IMPLANT

## 2024-07-08 NOTE — Progress Notes (Signed)
 Patient is off the unit to OR for ACDF

## 2024-07-08 NOTE — Progress Notes (Signed)
 Physical Therapy Treatment Patient Details Name: Theodore Hinton MRN: [id number] DOB: Jan 28, 1966 Today's Date: 07/08/2024   History of Present Illness Theodore Hinton is an 58 y.o. male who presented due to progressive weakness and increased falls. Imaging negative for acute stroke. PMHx: back injury with sx, DM, HTN, stroke    PT Comments  Pt agreeable to participate; plan for ACDF this yesterday. Reviewed cervical precautions and activity recommendations. Pt declines donning L AFO, reports he doesn't like to be too dependent on it. Pt ambulating 250 ft with no assistive device and supervision. Demonstrates dynamic balance deficits and gait abnormalities. Will likely benefit from OPPT post op.    If plan is discharge home, recommend the following: Assistance with cooking/housework   Can travel by private vehicle        Equipment Recommendations  None recommended by PT    Recommendations for Other Services       Precautions / Restrictions Precautions Precautions: Fall Restrictions Weight Bearing Restrictions Per Provider Order: No     Mobility  Bed Mobility Overal bed mobility: Independent                  Transfers Overall transfer level: Modified independent Equipment used: None                    Ambulation/Gait Ambulation/Gait assistance: Supervision Gait Distance (Feet): 250 Feet Assistive device: None Gait Pattern/deviations: Step-through pattern, Decreased dorsiflexion - left, Steppage, Drifts right/left Gait velocity: decreased     General Gait Details: Pt declines donning AFO. Pt with slightly crouched gait pattern and L foot drop, no overt LOB but impaired dynamic balance   Stairs             Wheelchair Mobility     Tilt Bed    Modified Rankin (Stroke Patients Only)       Balance Overall balance assessment: Needs assistance Sitting-balance support: Single extremity supported, Feet unsupported Sitting balance-Leahy Scale:  Normal     Standing balance support: During functional activity, No upper extremity supported Standing balance-Leahy Scale: Fair                              Hotel manager: No apparent difficulties  Cognition Arousal: Alert Behavior During Therapy: WFL for tasks assessed/performed   PT - Cognitive impairments: No apparent impairments                         Following commands: Intact      Cueing Cueing Techniques: Verbal cues  Exercises      General Comments        Pertinent Vitals/Pain Pain Assessment Pain Assessment: Faces Faces Pain Scale: No hurt    Home Living                          Prior Function            PT Goals (current goals can now be found in the care plan section) Acute Rehab PT Goals Patient Stated Goal: to get back to being active and playing golf Potential to Achieve Goals: Good Progress towards PT goals: Progressing toward goals    Frequency    Min 1X/week      PT Plan      Co-evaluation              AM-PAC PT 6 Clicks Mobility  Outcome Measure  Help needed turning from your back to your side while in a flat bed without using bedrails?: None Help needed moving from lying on your back to sitting on the side of a flat bed without using bedrails?: None Help needed moving to and from a bed to a chair (including a wheelchair)?: None Help needed standing up from a chair using your arms (e.g., wheelchair or bedside chair)?: None Help needed to walk in hospital room?: A Little Help needed climbing 3-5 steps with a railing? : A Little 6 Click Score: 22    End of Session Equipment Utilized During Treatment: Gait belt Activity Tolerance: Patient tolerated treatment well Patient left: in bed;with call bell/phone within reach   PT Visit Diagnosis: Unsteadiness on feet (R26.81);Other abnormalities of gait and mobility (R26.89)     Time: 9041-8979 PT Time  Calculation (min) (ACUTE ONLY): 22 min  Charges:    $Therapeutic Activity: 8-22 mins PT General Charges $$ ACUTE PT VISIT: 1 Visit                     Aleck Daring, PT, DPT Acute Rehabilitation Services Office [phone]    Alayne ONEIDA Daring 07/08/2024, 10:14 AM

## 2024-07-08 NOTE — TOC Progression Note (Signed)
 Transition of Care [facility]) - Progression Note    Patient Details  Name: Theodore Hinton MRN: [id number] Date of Birth: [birth year]  [facility] CM/SW Contact  Andrez JULIANNA George, RN Phone Number: 07/08/2024, 2:21 PM  Clinical Narrative:     Pt scheduled to have surgery today. IP Care Management following for needs post surgery.   Expected Discharge Plan: Home/Self Care Barriers to Discharge: Continued Medical Work up               Expected Discharge Plan and Services       Living arrangements for the past 2 months: Single Family Home                                       Social Drivers of Health (SDOH) Interventions SDOH Screenings   Food Insecurity: No Food Insecurity (07/04/2024)  Housing: Low Risk  (07/04/2024)  Transportation Needs: No Transportation Needs (07/04/2024)  Utilities: Not At Risk (07/04/2024)  Financial Resource Strain: Medium Risk (08/28/2023)   Received from [facility]  Social Connections: Unknown (12/28/2022)   Received from [facility]  Tobacco Use: Medium Risk (07/03/2024)    Readmission Risk Interventions     No data to display

## 2024-07-08 NOTE — Anesthesia Preprocedure Evaluation (Signed)
 Anesthesia Evaluation  Patient identified by MRN, date of birth, ID band Patient awake    Reviewed: Allergy & Precautions, NPO status , Patient's Chart, lab work & pertinent test results, reviewed documented beta blocker date and time   History of Anesthesia Complications Negative for: history of anesthetic complications  Airway Mallampati: IV  TM Distance: >3 FB Neck ROM: Limited  Mouth opening: Limited Mouth Opening  Dental no notable dental hx.    Pulmonary neg COPD   breath sounds clear to auscultation       Cardiovascular hypertension,  Rhythm:Regular Rate:Normal     Neuro/Psych Cervical myelopathy  Neuromuscular disease CVA, Residual Symptoms    GI/Hepatic ,neg GERD  ,,(+) neg Cirrhosis        Endo/Other  diabetes, Type 2    Renal/GU Renal disease     Musculoskeletal   Abdominal   Peds  Hematology   Anesthesia Other Findings   Reproductive/Obstetrics                              Anesthesia Physical Anesthesia Plan  ASA: 3  Anesthesia Plan: General   Post-op Pain Management:    Induction: Intravenous  PONV Risk Score and Plan: 2 and Dexamethasone   Airway Management Planned: Oral ETT and Video Laryngoscope Planned  Additional Equipment:   Intra-op Plan:   Post-operative Plan: Extubation in OR  Informed Consent: I have reviewed the patients History and Physical, chart, labs and discussed the procedure including the risks, benefits and alternatives for the proposed anesthesia with the patient or authorized representative who has indicated his/her understanding and acceptance.     Dental advisory given  Plan Discussed with: CRNA  Anesthesia Plan Comments:         Anesthesia Quick Evaluation

## 2024-07-08 NOTE — Plan of Care (Signed)
  Problem: Education: Goal: Ability to describe self-care measures that may prevent or decrease complications (Diabetes Survival Skills Education) will improve Outcome: Progressing   Problem: Metabolic: Goal: Ability to maintain appropriate glucose levels will improve Outcome: Progressing   Problem: Skin Integrity: Goal: Risk for impaired skin integrity will decrease Outcome: Progressing   Problem: Education: Goal: Knowledge of General Education information will improve Description: Including pain rating scale, medication(s)/side effects and non-pharmacologic comfort measures Outcome: Progressing   Problem: Clinical Measurements: Goal: Respiratory complications will improve Outcome: Progressing Goal: Cardiovascular complication will be avoided Outcome: Progressing   Problem: Coping: Goal: Level of anxiety will decrease Outcome: Progressing   Problem: Elimination: Goal: Will not experience complications related to urinary retention Outcome: Progressing

## 2024-07-08 NOTE — Op Note (Signed)
 Diagnosis: Cervical spondylitic myelopathy.  Postoperative diagnosis: Same.  Procedure: Anterior cervical discectomies and fusion C3-4 C4-5 C5-6 utilizing globus titanium cages packed with locally harvested autograft mixed with Vivigen and anterior cervical plating utilizing the Atlantis translational plating system  Surgeon: Arley helling.  Assistant: Rockey Peru.  Anesthesia: General.  EBL: Minimal.  HPI: 58 year old gentleman admitted through the emergency room with weakness in his arms and hands workup revealed severe cord compression exam consistent with myelopathy patient was placed on IV steroids observed clinical exam improved and he was scheduled for surgery.  We recommend anterior cervical discectomy and fusion at those 3 levels.  We extensively reviewed the risks and benefits of the operation with the patient as well as perioperative course expectations of outcome and alternatives to surgery and he understood and agreed to proceed forward.  Operative procedure: Patient was brought into the OR was induced to general anesthesia positioned supine the neck in slight extension 5 pounds a halter traction.  The right side of his neck was prepped and draped in routine sterile fashion.  Preoperative x-ray localized the appropriate level.  So a curvilinear incision was made just off the midline to the intraportal of the sternocleidomastoid and superficial platysma is dissected out divided longitudinally.  The avascular plane between the sternomastoid and strap muscle was developed down to the prevertebral fascia and prevertebral fascia was dissected away with Kitners.  Interoperative x-ray confirmed indication C4-5 disc base level so annulotomy's were made at all 3 disc bases longus was deflected laterally and self-retaining retractor was placed.  Anterior osteophytes were bitten off with a Leksell rongeur 2 and 3 and a Kerrison punch.  All 3 disc bases were drilled down to the posterior annulus and  osteophytic complex with a high-speed drill.  Under microscopic lamination first working at C3-4 this to space was further drilled down large leftward spur at this level was aggressively under Bitton marching laterally both C4 pedicles were identified both C4 nerve roots were decompressed and skeletonized flush with the pedicle.  Endplates prepared I sized up a 6 mm cage packed with locally harvested autograft mixed with Vivigen and inserted at C3-4.  Then attention taken at C5-6 in a similar fashion pathology here was primarily a large left-sided spur but bilateral this was all removed both C6 nerve roots were identified and both C6 foramina were decompressed and skeletonized flush with pedicle this also sized up a 6 mm titanium cage and then C4-5 was then subsequent drilled down this again under microscope of nation aggressive interbody both endplates decompressing central canal both C5 nerve roots were decompressed skeletonized flush pedicle there was a lot of stenosis on the left C5 nerve root this was all removed and both the nerve roots and the central canal was widely decompressed I sized up a 5 mm cage at this level then implanted all that placed some autograft material anterior to the cages underneath the plate plate had excellent purchase with all screws locking mechanisms were engaged wound copiously irrigated meticulous hemostasis was maintained placed a JP drain and the wound was closed in layers with interrupted Vicryl and the platysma and a running 4 subcuticular.  Dermabond benzoin Steri-Strips and a sterile dressing was applied patient to cover him in stable condition.  At the end the case all needle count sponge counts were correct.

## 2024-07-08 NOTE — Progress Notes (Signed)
 Subjective: Patient reports patient with no new complaint  Objective: Vital signs in last 24 hours: Temp:  [97.6 F (36.4 C)-98.9 F (37.2 C)] 98.6 F (37 C) (08/04 1604) Pulse Rate:  [42-61] 61 (08/04 1622) Resp:  [14-21] 21 (08/04 1622) BP: (127-158)/(64-83) 143/69 (08/04 1604) SpO2:  [94 %-98 %] 96 % (08/04 1622)  Intake/Output from previous day: 08/03 0701 - 08/04 0700 In: 240 [P.O.:240] Out: 450 [Urine:450] Intake/Output this shift: No intake/output data recorded.  Awake alert strength has weakness in triceps and 4+ out of 5 deltoid 4+5 otherwise stable  Lab Results: No results for input(s): WBC, HGB, HCT, PLT in the last 72 hours. BMET No results for input(s): NA, K, CL, CO2, GLUCOSE, BUN, CREATININE, CALCIUM  in the last 72 hours.  Studies/Results: No results found.  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  As well as perioperative course expectations of outcome and alternatives to surgery he understands and agrees to proceed forward.  LOS: 3 days     Arley SHAUNNA Helling 07/08/2024, 7:40 PM

## 2024-07-08 NOTE — Transfer of Care (Signed)
 Immediate Anesthesia Transfer of Care Note  Patient: Theodore Hinton  Procedure(s) Performed: ANTERIOR CERVICAL DECOMPRESSION/DISCECTOMY FUSION 3 LEVELS (Neck)  Patient Location: PACU  Anesthesia Type:General  Level of Consciousness: awake and alert   Airway & Oxygen Therapy: Patient Spontanous Breathing  Post-op Assessment: Report given to RN and Post -op Vital signs reviewed and stable  Post vital signs: Reviewed and stable  Last Vitals:  Vitals Value Taken Time  BP 112/96 07/08/24 22:45  Temp    Pulse 88 07/08/24 22:47  Resp 18 07/08/24 22:47  SpO2 92 % 07/08/24 22:47  Vitals shown include unfiled device data.  Last Pain:  Vitals:   07/08/24 1604  TempSrc: Oral  PainSc: 4       Patients Stated Pain Goal: 0 (07/08/24 0205)  Complications: No notable events documented.

## 2024-07-08 NOTE — Anesthesia Procedure Notes (Signed)
 Procedure Name: Intubation Date/Time: 07/08/2024 8:15 PM  Performed by: Mannie Krystal LABOR, CRNAPre-anesthesia Checklist: Patient identified, Emergency Drugs available, Suction available and Patient being monitored Patient Re-evaluated:Patient Re-evaluated prior to induction Oxygen Delivery Method: Circle system utilized Preoxygenation: Pre-oxygenation with 100% oxygen Induction Type: IV induction Ventilation: Mask ventilation without difficulty and Oral airway inserted - appropriate to patient size Laryngoscope Size: Glidescope and 4 Grade View: Grade I Tube type: Oral Tube size: 7.5 mm Number of attempts: 1 Airway Equipment and Method: Stylet and Oral airway Placement Confirmation: ETT inserted through vocal cords under direct vision, positive ETCO2 and breath sounds checked- equal and bilateral Secured at: 22 cm Tube secured with: Tape Dental Injury: Teeth and Oropharynx as per pre-operative assessment

## 2024-07-09 ENCOUNTER — Other Ambulatory Visit: Payer: Self-pay

## 2024-07-09 DIAGNOSIS — G992 Myelopathy in diseases classified elsewhere: Secondary | ICD-10-CM | POA: Diagnosis present

## 2024-07-09 DIAGNOSIS — G959 Disease of spinal cord, unspecified: Secondary | ICD-10-CM | POA: Diagnosis not present

## 2024-07-09 LAB — GLUCOSE, CAPILLARY
Glucose-Capillary: 249 mg/dL — ABNORMAL HIGH (ref 70–99)
Glucose-Capillary: 282 mg/dL — ABNORMAL HIGH (ref 70–99)
Glucose-Capillary: 273 mg/dL — ABNORMAL HIGH (ref 70–99)
Glucose-Capillary: 308 mg/dL — ABNORMAL HIGH (ref 70–99)
Glucose-Capillary: 302 mg/dL — ABNORMAL HIGH (ref 70–99)
Glucose-Capillary: 289 mg/dL — ABNORMAL HIGH (ref 70–99)
Glucose-Capillary: 300 mg/dL — ABNORMAL HIGH (ref 70–99)

## 2024-07-09 MED ORDER — CEFAZOLIN SODIUM-DEXTROSE 2-4 GM/100ML-% IV SOLN
2.0000 g | Freq: Three times a day (TID) | INTRAVENOUS | Status: AC
  Administered 2024-07-09 – 2024-07-10 (×6): 2 g via INTRAVENOUS
  Filled 2024-07-09 (×6): qty 100

## 2024-07-09 MED ORDER — PANTOPRAZOLE SODIUM 40 MG PO TBEC
40.0000 mg | DELAYED_RELEASE_TABLET | Freq: Every day | ORAL | Status: DC
  Administered 2024-07-09 – 2024-07-10 (×2): 40 mg via ORAL
  Filled 2024-07-09 (×2): qty 1

## 2024-07-09 MED ORDER — INSULIN GLARGINE-YFGN 100 UNIT/ML ~~LOC~~ SOLN
10.0000 [IU] | Freq: Every day | SUBCUTANEOUS | Status: DC
  Administered 2024-07-09 – 2024-07-10 (×2): 10 [IU] via SUBCUTANEOUS
  Filled 2024-07-09 (×3): qty 0.1

## 2024-07-09 MED ORDER — ALUM & MAG HYDROXIDE-SIMETH 200-200-20 MG/5ML PO SUSP: 30.0000 mL | Freq: Four times a day (QID) | ORAL | Status: DC | PRN

## 2024-07-09 MED ORDER — MENTHOL 3 MG MT LOZG: 1.0000 | LOZENGE | OROMUCOSAL | Status: DC | PRN

## 2024-07-09 MED ORDER — SODIUM CHLORIDE 0.9 % IV SOLN
250.0000 mL | INTRAVENOUS | Status: AC
  Administered 2024-07-09: 250 mL via INTRAVENOUS

## 2024-07-09 MED ORDER — INSULIN GLARGINE-YFGN 100 UNIT/ML ~~LOC~~ SOLN: 10.0000 [IU] | Freq: Two times a day (BID) | SUBCUTANEOUS | Status: DC

## 2024-07-09 MED ORDER — PHENOL 1.4 % MT LIQD
1.0000 | OROMUCOSAL | Status: DC | PRN
Start: 2024-07-09 — End: 2024-07-11
  Administered 2024-07-09: 1 via OROMUCOSAL
  Filled 2024-07-09: qty 177

## 2024-07-09 MED ORDER — PANTOPRAZOLE SODIUM 40 MG IV SOLR
40.0000 mg | Freq: Every day | INTRAVENOUS | Status: DC
  Administered 2024-07-09: 40 mg via INTRAVENOUS
  Filled 2024-07-09: qty 10

## 2024-07-09 MED ORDER — SODIUM CHLORIDE 0.9% FLUSH: 3.0000 mL | INTRAVENOUS | Status: DC | PRN

## 2024-07-09 MED ORDER — SODIUM CHLORIDE 0.9% FLUSH
3.0000 mL | Freq: Two times a day (BID) | INTRAVENOUS | Status: DC
  Administered 2024-07-09 – 2024-07-11 (×6): 3 mL via INTRAVENOUS

## 2024-07-09 MED ORDER — HYDROMORPHONE HCL 1 MG/ML IJ SOLN
0.5000 mg | INTRAMUSCULAR | Status: DC | PRN
  Administered 2024-07-09 (×5): 0.5 mg via INTRAVENOUS
  Filled 2024-07-09 (×5): qty 0.5

## 2024-07-09 MED FILL — Thrombin For Soln 5000 Unit: CUTANEOUS | Qty: 5000 | Status: AC

## 2024-07-09 NOTE — Progress Notes (Signed)
 Orthopedic Tech Progress Note Patient Details:  Theodore Hinton 1966-06-07 [id number]  Patient ID: Theodore Hinton, male   DOB: [birth year], 58 y.o.   MRN: [id number] I spoke with the RN. They said to bring it in the morning. Chandra Dorn PARAS 07/09/2024, 1:59 AM

## 2024-07-09 NOTE — Progress Notes (Signed)
 Call placed to 3W to inform patient would be returning to room. Receiving nurse agreeable to bedside report upon patient arrival. Patient transported back to his room via bed, on telemetry monitor on 2L O2 via nasal canula. Patient tolerated transport well with no complaints or concerns voiced. Upon arrival to room receiving nurse Dania received bedside report and completed visual assessment of patient with this nurse. Patient in no distress, wife at bedside. RN Dania denied any further questions, concerns or need of assistance at this time. Patient resting quietly in no acute distress as I exited the room.

## 2024-07-09 NOTE — Progress Notes (Signed)
 Orthopedic Tech Progress Note Patient Details:  Theodore Hinton 10-11-1966 [id number]  Ortho Devices Type of Ortho Device: Soft collar Ortho Device/Splint Interventions: Ordered, Application, Adjustment   Post Interventions Patient Tolerated: Well Instructions Provided: Care of device, Adjustment of device  Chandra Dorn PARAS 07/09/2024, 6:39 AM

## 2024-07-09 NOTE — Evaluation (Signed)
 Occupational Therapy Re-Evaluation Patient Details Name: Theodore Hinton MRN: [id number] DOB: [birth year] Today's Date: 07/09/2024   History of Present Illness   Theodore Hinton is an 58 y.o. male who presented due to progressive weakness and increased falls. Imaging negative for acute stroke. MRI cervical spine showed cervical myelopathy. Now s/p ACDF C3-6 07/08/2024. PMHx: back injury with sx, DM, HTN, stroke     Clinical Impressions Theodore Hinton was re-evaluated s/p the above spine surgery. He at baseline his is indep and lives with family. Upon evaluation pt was limited by new surgical pain and soreness, but reports improved function and sensation of his LLE. Overall he demonstrated mod I ability to complete ADLs and mobility without DME. Provided cues and education on spinal precautions and compensatory techniques throughout, handout provided and pt demonstrated great recall during ADLs and mobility. Pt does not require further acute OT services. Recommend d/c home with support of family.       If plan is discharge home, recommend the following:   Assistance with cooking/housework     Functional Status Assessment   Patient has had a recent decline in their functional status and demonstrates the ability to make significant improvements in function in a reasonable and predictable amount of time.     Equipment Recommendations   None recommended by OT      Precautions/Restrictions   Precautions Precautions: Cervical;Fall Precaution Booklet Issued: Yes (comment) Required Braces or Orthoses: Cervical Brace Cervical Brace: Soft collar Restrictions Weight Bearing Restrictions Per Provider Order: No     Mobility Bed Mobility Overal bed mobility: Modified Independent             General bed mobility comments: great carryover of log roll    Transfers Overall transfer level: Independent                        Balance Overall balance assessment: Mild deficits  observed, not formally tested                                         ADL either performed or assessed with clinical judgement   ADL Overall ADL's : Modified independent                                       General ADL Comments: mod I after review of compensatory techniques, pt is able to get into figure four position for LB ADLs     Vision Baseline Vision/History: 0 No visual deficits Vision Assessment?: No apparent visual deficits     Perception Perception: Within Functional Limits       Praxis Praxis: WFL       Pertinent Vitals/Pain Pain Assessment Pain Assessment: Faces Faces Pain Scale: Hurts a little bit Pain Location: neck Pain Descriptors / Indicators: Discomfort Pain Intervention(s): Limited activity within patient's tolerance, Monitored during session     Extremity/Trunk Assessment Upper Extremity Assessment Upper Extremity Assessment: Overall WFL for tasks assessed   Lower Extremity Assessment Lower Extremity Assessment: Defer to PT evaluation RLE Deficits / Details: Strength 5/5 LLE Deficits / Details: Hip flexion 4-/5, knee extension 4/5, ankle dorsiflexion 4-/5   Cervical / Trunk Assessment Cervical / Trunk Assessment: Neck Surgery   Communication Communication Communication: No apparent difficulties   Cognition Arousal: Alert Behavior During Therapy: Theodore Hinton  for tasks assessed/performed Cognition: No family/caregiver present to determine baseline             OT - Cognition Comments: Overall WFL for orienteation, command following and basic ADLs. Good recall of BLT. good carryover of compensatory techniques                 Following commands: Intact       Cueing  General Comments   Cueing Techniques: Verbal cues  VSS   Exercises     Shoulder Instructions      Home Living Family/patient expects to be discharged to:: Private residence Living Arrangements: Spouse/significant  other;Non-relatives/Friends Available Help at Discharge: Family;Friend(s);Available 24 hours/day Type of Home: Mobile home Home Access: Ramped entrance     Home Layout: One level     Bathroom Shower/Tub: Producer, television/film/video: Standard     Home Equipment: Agricultural consultant (2 wheels);Cane - single point;Shower seat;BSC/3in1;Grab bars - tub/shower;Grab bars - toilet          Prior Functioning/Environment Prior Level of Function : Working/employed;Driving             Mobility Comments: no device, 4-5 recent falls 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    OT Problem List: Decreased activity tolerance;Impaired balance (sitting and/or standing);Decreased safety awareness;Decreased knowledge of use of DME or AE        OT Goals(Current goals can be found in the care plan section)   Acute Rehab OT Goals Patient Stated Goal: to feel better OT Goal Formulation: With patient Time For Goal Achievement: 07/09/24 Potential to Achieve Goals: Good   AM-PAC OT 6 Clicks Daily Activity     Outcome Measure Help from another person eating meals?: None Help from another person taking care of personal grooming?: None Help from another person toileting, which includes using toliet, bedpan, or urinal?: None Help from another person bathing (including washing, rinsing, drying)?: None Help from another person to put on and taking off regular upper body clothing?: None Help from another person to put on and taking off regular lower body clothing?: None 6 Click Score: 24   End of Session Equipment Utilized During Treatment: Rolling walker (2 wheels) Nurse Communication: Mobility status;Patient requests pain meds  Activity Tolerance: Patient tolerated treatment well Patient left: in bed;with call bell/phone within reach  OT Visit Diagnosis: Unsteadiness on feet (R26.81);Muscle weakness (generalized) (M62.81);History of falling (Z91.81);Repeated falls (R29.6)                Time:  8571-8549 OT Time Calculation (min): 22 min Charges:  OT General Charges $OT Visit: 1 Visit OT Evaluation $OT Eval Moderate Complexity: 1 Mod  Lucie Kendall, OTR/L Acute Rehabilitation Services Office [phone] Secure Chat Communication Preferred   Lucie JONETTA Kendall 07/09/2024, 3:15 PM

## 2024-07-09 NOTE — Anesthesia Postprocedure Evaluation (Signed)
 Anesthesia Post Note  Patient: Theodore Hinton  Procedure(s) Performed: ANTERIOR CERVICAL DECOMPRESSION/DISCECTOMY FUSION 3 LEVELS (Neck)     Patient location during evaluation: PACU Anesthesia Type: General Level of consciousness: awake and alert Pain management: pain level controlled Vital Signs Assessment: post-procedure vital signs reviewed and stable Respiratory status: spontaneous breathing, nonlabored ventilation and respiratory function stable Cardiovascular status: blood pressure returned to baseline and stable Postop Assessment: no apparent nausea or vomiting Anesthetic complications: no   No notable events documented.  Last Vitals:  Vitals:   07/08/24 2330 07/09/24 0039  BP: 133/72 (!) 143/74  Pulse: 79 79  Resp: 12 16  Temp: 36.7 C 36.9 C  SpO2: 91% 98%    Last Pain:  Vitals:   07/09/24 0139  TempSrc:   PainSc: Asleep                 Izetta Sakamoto,W. EDMOND

## 2024-07-09 NOTE — Progress Notes (Signed)
 [facility] CONSULT PROGRESS NOTE    Progress Note  Theodore Hinton  FMW:[id number] DOB: 1965-12-22 DOA: 07/03/2024 PCP: [facility]     Brief Narrative:   Theodore Hinton is an 58 y.o. 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 Assessment/Plan:   Cervical myelopathy (HCC) Per neurosurgery, for surgery early next week.  Controlled type 2 diabetes mellitus without complication, without long-term current use of insulin  (HCC) A1c of 7.6, was given dexamethasone . Increase long-acting insulin  continue sliding scale .  Essential HTN (hypertension) Continue metoprolol  and Norvasc  and lisinopril .  Blood pressure is well-controlled.  DVT prophylaxis: lovenox  Family Communication:none Status is: Observation The patient remains OBS appropriate and will d/c before 2 midnights.    Code Status:     Code Status Orders  (From admission, onward)           Start     Ordered   07/03/24 2317  Full code  Continuous       Question:  By:  Answer:  Consent: discussion documented in EHR   07/03/24 2316           Code Status History     Date Active Date Inactive Code Status Order ID Comments User Context   12/19/2022 1905 12/20/2022 2058 Full Code [id number]  Laurita Cort DASEN, MD ED   06/18/2022 2149 06/20/2022 1812 Full Code [id number]  Michaela Aisha SQUIBB, MD Inpatient         IV Access:   Peripheral IV   Procedures and diagnostic studies:   DG Cervical Spine 1 View Result Date: 07/08/2024 CLINICAL  DATA:  Elective surgery. EXAM: DG CERVICAL SPINE - 1 VIEW COMPARISON:  Recent MRI FINDINGS: Four fluoroscopic spot views of the cervical spine submitted from the operating room. Imaging obtained during anterior fusion C3 through C6 with interbody spacers. Fluoroscopy time 12 seconds. Dose 1.44 mGy. IMPRESSION: Intraoperative fluoroscopy during cervical fusion. Electronically Signed   By: Andrea Gasman M.D.   On: 07/08/2024 23:01   DG C-Arm 1-60 Min-No Report Result Date: 07/08/2024 Fluoroscopy was utilized by the requesting physician.  No radiographic interpretation.   DG C-Arm 1-60 Min-No Report Result Date: 07/08/2024 Fluoroscopy was utilized by the requesting physician.  No radiographic interpretation.     Medical Consultants:   None.   Subjective:    Theodore Hinton no complaints today  Objective:    Vitals:   07/08/24 2330 07/09/24 0039 07/09/24 0439 07/09/24 0727  BP: 133/72 (!) 143/74 137/65 135/76  Pulse: 79 79 (!) 54 (!) 53  Resp: 12 16 16 16   Temp: 98.1 F (36.7 C) 98.4 F (36.9 C) (!) 97.4 F (36.3 C) (!) 97.5 F (36.4 C)  TempSrc:  Oral Oral Oral  SpO2: 91% 98% 97% 99%  Weight:      Height:       SpO2: 99 % O2 Flow Rate (L/min): 2 L/min   Intake/Output Summary (Last 24 hours) at 07/09/2024 0851 Last data filed at 07/09/2024 0457 Gross per 24 hour  Intake 1520 ml  Output 650 ml  Net 870 ml   [employer]  07/03/24 1549  Weight: 83 kg    Exam: General exam: In no acute distress. Respiratory system: Good air movement and clear to auscultation. Cardiovascular system: S1 & S2 heard, RRR. No JVD. Gastrointestinal system: Abdomen is nondistended, soft and nontender.  Skin: No rashes, lesions or ulcers Psychiatry: Judgement and insight appear normal. Mood & affect appropriate.  Data Reviewed:    Labs: Basic Metabolic Panel: Recent Labs  Lab 07/03/24 1634 07/03/24 1645  NA 137 138  K 4.2 4.0  CL 107 106  CO2 20*  --   GLUCOSE 163* 166*   BUN 7 7  CREATININE 0.64 0.50*  CALCIUM  8.9  --    GFR Estimated Creatinine Clearance: 104.3 mL/min (A) (by C-G formula based on SCr of 0.5 mg/dL (L)). Liver Function Tests: Recent Labs  Lab 07/03/24 1634  AST 25  ALT 16  ALKPHOS 64  BILITOT 1.9*  PROT 6.1*  ALBUMIN 3.4*   No results for input(s): LIPASE, AMYLASE in the last 168 hours. No results for input(s): AMMONIA in the last 168 hours. Coagulation profile Recent Labs  Lab 07/03/24 1634  INR 1.0   COVID-19 Labs  No results for input(s): DDIMER, FERRITIN, LDH, CRP in the last 72 hours.  Lab Results  Component Value Date   SARSCOV2NAA NEGATIVE 01/09/2023   SARSCOV2NAA NEGATIVE 06/18/2022    CBC: Recent Labs  Lab 07/03/24 1634 07/03/24 1645  WBC 8.8  --   NEUTROABS 6.0  --   HGB 17.6* 18.4*  HCT 52.3* 54.0*  MCV 91.0  --   PLT 230  --    Cardiac Enzymes: No results for input(s): CKTOTAL, CKMB, CKMBINDEX, TROPONINI in the last 168 hours. BNP (last 3 results) No results for input(s): PROBNP in the last 8760 hours. CBG: Recent Labs  Lab 07/08/24 1713 07/08/24 1915 07/08/24 2322 07/09/24 0618 07/09/24 0810  GLUCAP 93 88 169* 249* 282*   D-Dimer: No results for input(s): DDIMER in the last 72 hours. Hgb A1c: No results for input(s): HGBA1C in the last 72 hours.  Lipid Profile: No results for input(s): CHOL, HDL, LDLCALC, TRIG, CHOLHDL, LDLDIRECT in the last 72 hours. Thyroid function studies: No results for input(s): TSH, T4TOTAL, T3FREE, THYROIDAB in the last 72 hours.  Invalid input(s): FREET3 Anemia work up: No results for input(s): VITAMINB12, FOLATE, FERRITIN, TIBC, IRON, RETICCTPCT in the last 72 hours. Sepsis Labs: Recent Labs  Lab 07/03/24 1634  WBC 8.8   Microbiology Recent Results (from the past 240 hours)  Surgical pcr screen     Status: None   Collection Time: 07/05/24  2:08 PM   Specimen: Nasal Mucosa; Nasal  Swab  Result Value Ref Range Status   MRSA, PCR NEGATIVE NEGATIVE Final   Staphylococcus aureus NEGATIVE NEGATIVE Final    Comment: (NOTE) The Xpert SA Assay (FDA approved for NASAL specimens in patients 86 years of age and older), is one component of a comprehensive surveillance program. It is not intended to diagnose infection nor to guide or monitor treatment. Performed at [facility], [address] [place], [place] [address]      Medications:    amLODipine   10 mg Oral Daily   atorvastatin   80 mg Oral Daily   docusate sodium   100 mg Oral Daily   ezetimibe   10 mg Oral Daily   gabapentin   300 mg Oral TID   insulin  aspart  0-15 Units Subcutaneous TID WC   insulin  aspart  0-5 Units Subcutaneous QHS   insulin   aspart  4 Units Subcutaneous TID WC   insulin  glargine-yfgn  5 Units Subcutaneous Q2200   lamoTRIgine   25 mg Oral Daily   metoprolol  tartrate  50 mg Oral BID   pantoprazole  (PROTONIX ) IV  40 mg Intravenous QHS   sodium chloride  flush  3 mL Intravenous Q12H   Continuous Infusions:  sodium chloride  250 mL (07/09/24 0251)    ceFAZolin  (ANCEF ) IV 2 g (07/09/24 0253)      LOS: 4 days   Theodore Hinton  [facility]  07/09/2024, 8:51 AM

## 2024-07-09 NOTE — Evaluation (Signed)
 Physical Therapy Re-Evaluation Patient Details Name: Theodore Hinton MRN: [id number] DOB: 09/08/1966 Today's Date: 07/09/2024  History of Present Illness  [name] is an 58 y.o. male who presented due to progressive weakness and increased falls. Imaging negative for acute stroke. MRI cervical spine showed cervical myelopathy. Now s/p ACDF C3-6 07/08/2024. PMHx: back injury with sx, DM, HTN, stroke  Clinical Impression  Pt re-evaluated s/p ACDF C3-6. Pt verbalizes good pain control and reports improvement in sensation and left foot drop. Pt continues with mild LLE weakness in comparison to RLE, dynamic balance deficits and gait abnormalities. Demonstrates slightly improved pace post op, ambulating level surface hallways with no assistive device and negotiated steps without physical difficulty. Reviewed cervical precautions and activity recommendations. Recommend follow up OPPT to address deficits and maximize functional independence. No further acute PT needs. Thank you for this consult.      If plan is discharge home, recommend the following: Assistance with cooking/housework   Can travel by private vehicle        Equipment Recommendations None recommended by PT  Recommendations for Other Services       Functional Status Assessment Patient has had a recent decline in their functional status and demonstrates the ability to make significant improvements in function in a reasonable and predictable amount of time.     Precautions / Restrictions Precautions Precautions: Cervical;Fall Precaution Booklet Issued: Yes (comment) Required Braces or Orthoses: Cervical Brace Cervical Brace: Soft collar Restrictions Weight Bearing Restrictions Per Provider Order: No      Mobility  Bed Mobility Overal bed mobility: Modified Independent             General bed mobility comments: Verbal cues for log roll technique, HOB elevated 16 degrees, use of rail    Transfers Overall transfer  level: Independent Equipment used: None                    Ambulation/Gait Ambulation/Gait assistance: Supervision Gait Distance (Feet): 300 Feet Assistive device: None Gait Pattern/deviations: Step-through pattern, Decreased dorsiflexion - left, Steppage, Drifts right/left Gait velocity: decreased     General Gait Details: Slight improved pace, verbal cues for upward gaze and scapular retraction. Decreased L heel strike  Stairs Stairs: Yes Stairs assistance: Supervision Stair Management: One rail Right Number of Stairs: 3 General stair comments: Step by step pattern  Wheelchair Mobility     Tilt Bed    Modified Rankin (Stroke Patients Only)       Balance Overall balance assessment: Mild deficits observed, not formally tested                                           Pertinent Vitals/Pain Pain Assessment Pain Assessment: Faces Faces Pain Scale: Hurts a little bit Pain Location: neck Pain Descriptors / Indicators: Discomfort Pain Intervention(s): Monitored during session, Premedicated before session    Home Living Family/patient expects to be discharged to:: Private residence Living Arrangements: Spouse/significant other;Non-relatives/Friends Available Help at Discharge: Family;Friend(s);Available 24 hours/day Type of Home: Mobile home Home Access: Ramped entrance       Home Layout: One level Home Equipment: Agricultural consultant (2 wheels);Cane - single point;Shower seat;BSC/3in1;Grab bars - tub/shower;Grab bars - toilet      Prior Function Prior Level of Function : Working/employed;Driving             Mobility Comments: no device, 4-5 recent falls 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[employer],     Extremity/Trunk Assessment   Upper Extremity Assessment Upper Extremity Assessment: Defer to OT evaluation    Lower Extremity Assessment Lower Extremity Assessment: RLE deficits/detail;LLE deficits/detail RLE Deficits / Details: Strength  5/5 LLE Deficits / Details: Hip flexion 4-/5, knee extension 4/5, ankle dorsiflexion 4-/5    Cervical / Trunk Assessment Cervical / Trunk Assessment: Neck Surgery  Communication   Communication Communication: No apparent difficulties    Cognition Arousal: Alert Behavior During Therapy: WFL for tasks assessed/performed   PT - Cognitive impairments: No apparent impairments                         Following commands: Intact       Cueing       General Comments      Exercises     Assessment/Plan    PT Assessment Patient does not need any further PT services  PT Problem List Decreased balance;Decreased mobility       PT Treatment Interventions      PT Goals (Current goals can be found in the Care Plan section)  Acute Rehab PT Goals Patient Stated Goal: to get back to being active and playing golf PT Goal Formulation: All assessment and education complete, DC therapy    Frequency       Co-evaluation               AM-PAC PT 6 Clicks Mobility  Outcome Measure Help needed turning from your back to your side while in a flat bed without using bedrails?: None Help needed moving from lying on your back to sitting on the side of a flat bed without using bedrails?: None Help needed moving to and from a bed to a chair (including a wheelchair)?: None Help needed standing up from a chair using your arms (e.g., wheelchair or bedside chair)?: None Help needed to walk in hospital room?: A Little Help needed climbing 3-5 steps with a railing? : A Little 6 Click Score: 22    End of Session Equipment Utilized During Treatment: Cervical collar Activity Tolerance: Patient tolerated treatment well Patient left: in bed;with call bell/phone within reach Nurse Communication: Mobility status PT Visit Diagnosis: Unsteadiness on feet (R26.81);Other abnormalities of gait and mobility (R26.89)    Time: 8948-8890 PT Time Calculation (min) (ACUTE ONLY): 18  min   Charges:   PT Evaluation $PT Re-evaluation: 1 Re-eval   PT General Charges $$ ACUTE PT VISIT: 1 Visit         Aleck Daring, PT, DPT Acute Rehabilitation Services Office [phone]   Alayne ONEIDA Daring 07/09/2024, 11:38 AM

## 2024-07-09 NOTE — Progress Notes (Signed)
 Subjective: Patient reports doing well just soreness neck and throat arms and hands feel much better  Objective: Vital signs in last 24 hours: Temp:  [97.4 F (36.3 C)-98.6 F (37 C)] 97.5 F (36.4 C) (08/05 0727) Pulse Rate:  [42-91] 53 (08/05 0727) Resp:  [12-21] 16 (08/05 0727) BP: (112-149)/(64-96) 135/76 (08/05 0727) SpO2:  [90 %-99 %] 99 % (08/05 0727)  Intake/Output from previous day: 08/04 0701 - 08/05 0700 In: 1520 [P.O.:120; I.V.:1400] Out: 650 [Urine:550; Drains:25; Blood:75] Intake/Output this shift: No intake/output data recorded.  Strength improved 4+ out of 5 grip strength in the left deltoid seem to be intact incisions get some saturation I think that is blood coming from at the drain site  Lab Results: No results for input(s): WBC, HGB, HCT, PLT in the last 72 hours. BMET No results for input(s): NA, K, CL, CO2, GLUCOSE, BUN, CREATININE, CALCIUM  in the last 72 hours.  Studies/Results: DG Cervical Spine 1 View Result Date: 07/08/2024 CLINICAL DATA:  Elective surgery. EXAM: DG CERVICAL SPINE - 1 VIEW COMPARISON:  Recent MRI FINDINGS: Four fluoroscopic spot views of the cervical spine submitted from the operating room. Imaging obtained during anterior fusion C3 through C6 with interbody spacers. Fluoroscopy time 12 seconds. Dose 1.44 mGy. IMPRESSION: Intraoperative fluoroscopy during cervical fusion. Electronically Signed   By: Andrea Gasman M.D.   On: 07/08/2024 23:01   DG C-Arm 1-60 Min-No Report Result Date: 07/08/2024 Fluoroscopy was utilized by the requesting physician.  No radiographic interpretation.   DG C-Arm 1-60 Min-No Report Result Date: 07/08/2024 Fluoroscopy was utilized by the requesting physician.  No radiographic interpretation.    Assessment/Plan: Postop day 1 endocervical discectomy and fusion will check drain output but mobilize with physical Occupational Therapy.  LOS: 4 days     Theodore Hinton 07/09/2024, 7:51  AM

## 2024-07-10 ENCOUNTER — Encounter (HOSPITAL_COMMUNITY): Payer: Self-pay | Admitting: Neurosurgery

## 2024-07-10 DIAGNOSIS — G959 Disease of spinal cord, unspecified: Secondary | ICD-10-CM | POA: Diagnosis not present

## 2024-07-10 LAB — GLUCOSE, CAPILLARY
Glucose-Capillary: 248 mg/dL — ABNORMAL HIGH (ref 70–99)
Glucose-Capillary: 181 mg/dL — ABNORMAL HIGH (ref 70–99)
Glucose-Capillary: 189 mg/dL — ABNORMAL HIGH (ref 70–99)
Glucose-Capillary: 194 mg/dL — ABNORMAL HIGH (ref 70–99)

## 2024-07-10 NOTE — Progress Notes (Signed)
 Subjective: Patient reports doing ok, a lot of neck pain. Feels like his strength is getting better and even his left foot drop  Objective: Vital signs in last 24 hours: Temp:  [97.5 F (36.4 C)-98.6 F (37 C)] 98.3 F (36.8 C) (08/06 0724) Pulse Rate:  [55-73] 55 (08/06 0724) Resp:  [14-20] 20 (08/06 0724) BP: (122-154)/(62-84) 125/74 (08/06 0724) SpO2:  [95 %-99 %] 96 % (08/06 0724)  Intake/Output from previous day: 08/05 0701 - 08/06 0700 In: 235.9 [I.V.:35.9; IV Piggyback:200] Out: 950 [Urine:950] Intake/Output this shift: No intake/output data recorded.    Lab Results: Lab Results  Component Value Date   WBC 8.8 07/03/2024   HGB 18.4 (H) 07/03/2024   HCT 54.0 (H) 07/03/2024   MCV 91.0 07/03/2024   PLT 230 07/03/2024   Lab Results  Component Value Date   INR 1.0 07/03/2024   BMET Lab Results  Component Value Date   NA 138 07/03/2024   K 4.0 07/03/2024   CL 106 07/03/2024   CO2 20 (L) 07/03/2024   GLUCOSE 166 (H) 07/03/2024   BUN 7 07/03/2024   CREATININE 0.50 (L) 07/03/2024   CALCIUM  8.9 07/03/2024    Studies/Results: DG Cervical Spine 1 View Result Date: 07/08/2024 CLINICAL DATA:  Elective surgery. EXAM: DG CERVICAL SPINE - 1 VIEW COMPARISON:  Recent MRI FINDINGS: Four fluoroscopic spot views of the cervical spine submitted from the operating room. Imaging obtained during anterior fusion C3 through C6 with interbody spacers. Fluoroscopy time 12 seconds. Dose 1.44 mGy. IMPRESSION: Intraoperative fluoroscopy during cervical fusion. Electronically Signed   By: Andrea Gasman M.D.   On: 07/08/2024 23:01   DG C-Arm 1-60 Min-No Report Result Date: 07/08/2024 Fluoroscopy was utilized by the requesting physician.  No radiographic interpretation.   DG C-Arm 1-60 Min-No Report Result Date: 07/08/2024 Fluoroscopy was utilized by the requesting physician.  No radiographic interpretation.    Assessment/Plan: Postop day 2 ACDF for myelopathy. Doing a lot better,  therapy recommending home with outpatient PT. Will see how his blood sugar does today and maybe discharge home later.    LOS: 5 days    Suzen Lacks Prentice Sackrider 07/10/2024, 8:56 AM

## 2024-07-10 NOTE — Plan of Care (Signed)
 Problem: Education: Goal: Ability to describe self-care measures that may prevent or decrease complications (Diabetes Survival Skills Education) will improve Outcome: Progressing Goal: Individualized Educational Video(s) Outcome: Progressing   Problem: Coping: Goal: Ability to adjust to condition or change in health will improve Outcome: Progressing   Problem: Fluid Volume: Goal: Ability to maintain a balanced intake and output will improve Outcome: Progressing   Problem: Health Behavior/Discharge Planning: Goal: Ability to identify and utilize available resources and services will improve Outcome: Progressing Goal: Ability to manage health-related needs will improve Outcome: Progressing   Problem: Metabolic: Goal: Ability to maintain appropriate glucose levels will improve Outcome: Progressing   Problem: Nutritional: Goal: Maintenance of adequate nutrition will improve Outcome: Progressing Goal: Progress toward achieving an optimal weight will improve Outcome: Progressing   Problem: Skin Integrity: Goal: Risk for impaired skin integrity will decrease Outcome: Progressing   Problem: Tissue Perfusion: Goal: Adequacy of tissue perfusion will improve Outcome: Progressing   Problem: Education: Goal: Knowledge of General Education information will improve Description: Including pain rating scale, medication(s)/side effects and non-pharmacologic comfort measures Outcome: Progressing   Problem: Health Behavior/Discharge Planning: Goal: Ability to manage health-related needs will improve Outcome: Progressing   Problem: Clinical Measurements: Goal: Ability to maintain clinical measurements within normal limits will improve Outcome: Progressing Goal: Will remain free from infection Outcome: Progressing Goal: Diagnostic test results will improve Outcome: Progressing Goal: Respiratory complications will improve Outcome: Progressing Goal: Cardiovascular complication will  be avoided Outcome: Progressing   Problem: Activity: Goal: Risk for activity intolerance will decrease Outcome: Progressing   Problem: Nutrition: Goal: Adequate nutrition will be maintained Outcome: Progressing   Problem: Coping: Goal: Level of anxiety will decrease Outcome: Progressing   Problem: Elimination: Goal: Will not experience complications related to bowel motility Outcome: Progressing Goal: Will not experience complications related to urinary retention Outcome: Progressing   Problem: Pain Managment: Goal: General experience of comfort will improve and/or be controlled Outcome: Progressing   Problem: Safety: Goal: Ability to remain free from injury will improve Outcome: Progressing   Problem: Skin Integrity: Goal: Risk for impaired skin integrity will decrease Outcome: Progressing   Problem: Education: Goal: Ability to verbalize activity precautions or restrictions will improve Outcome: Progressing Goal: Knowledge of the prescribed therapeutic regimen will improve Outcome: Progressing Goal: Understanding of discharge needs will improve Outcome: Progressing   Problem: Activity: Goal: Ability to avoid complications of mobility impairment will improve Outcome: Progressing Goal: Ability to tolerate increased activity will improve Outcome: Progressing Goal: Will remain free from falls Outcome: Progressing   Problem: Education: Goal: Knowledge of General Education information will improve Description: Including pain rating scale, medication(s)/side effects and non-pharmacologic comfort measures Outcome: Progressing   Problem: Health Behavior/Discharge Planning: Goal: Ability to manage health-related needs will improve Outcome: Progressing   Problem: Clinical Measurements: Goal: Ability to maintain clinical measurements within normal limits will improve Outcome: Progressing Goal: Will remain free from infection Outcome: Progressing Goal: Diagnostic  test results will improve Outcome: Progressing Goal: Respiratory complications will improve Outcome: Progressing Goal: Cardiovascular complication will be avoided Outcome: Progressing   Problem: Activity: Goal: Risk for activity intolerance will decrease Outcome: Progressing   Problem: Nutrition: Goal: Adequate nutrition will be maintained Outcome: Progressing   Problem: Coping: Goal: Level of anxiety will decrease Outcome: Progressing   Problem: Elimination: Goal: Will not experience complications related to bowel motility Outcome: Progressing Goal: Will not experience complications related to urinary retention Outcome: Progressing   Problem: Pain Managment: Goal: General experience of comfort will improve and/or  be controlled Outcome: Progressing   Problem: Safety: Goal: Ability to remain free from injury will improve Outcome: Progressing   Problem: Skin Integrity: Goal: Risk for impaired skin integrity will decrease Outcome: Progressing

## 2024-07-10 NOTE — Progress Notes (Signed)
  Progress Note   Patient: Theodore Hinton FMW:[id number] DOB: 04/22/1966 DOA: 07/03/2024     5 DOS: the patient was seen and examined on 07/10/2024 at 8:10AM      Brief hospital course: Theodore Hinton is a 58 y.o M with DM, HTN, hx stroke who presented with progressive weakness and radiculopathy, admitted by NSGY for ACDF.  Hospitalist service consulted for management of HTN and DM.      Assessment and Plan: Cervical myelopathy S/p ACDF C3-6 on 8/4 by Dr. Onetha - Post-op care per NSGY   Type 2 diabetes with hyperglycemia Hemoglobin A1c 7.6%, well-controlled prior to admission on metformin  alone.  Recently easily controlled with low-dose sliding scale/Lantus , now markedly hyperglycemic postop.  Unless NSGY feel strongly about strict control, I would favor sticking with basal bolus insulin  for now - Changed to low-carb diet, discussed diet with patient - Continue glargine - Continue SS corrections - Continue mealtime insulin  - Consult DM ed   Hypertension Blood pressure at goal -Continue amlodipine , metoprolol  -Hold lisinopril   Cerebrovascular disease - Continue Lipitor  and Zetia , patient not taking at home, needs reinforcement to take after discharge - Resume Plavix  as soon as cleared by NSGY       Subjective: Pain from surgery overnight, sore throat.  No new hand symptoms, no fever.  Sugars high.     Physical Exam: BP 125/74 (BP Location: Left Arm)   Pulse (!) 55   Temp 98.3 F (36.8 C) (Oral)   Resp 20   Ht 5' 8 (1.727 m)   Wt 83 kg   SpO2 96%   BMI 27.82 kg/m   Adult male, sitting up in bed, interactive and appropriate RRR, no murmurs, no peripheral edema Respiratory rate normal, lungs clear without rales or wheezes Oriented to person, place, time, speech fluent, moves upper extremities with slowness, but symmetric strength    Data Reviewed: Glucoses elevated   Family Communication: None    Disposition: Status is: Inpatient Cleared for  discharge from standpoint of diabetes, HTN, and cerebrovascular disease  Given BG control preop, could add Glargine 8 unit daily at discharge, would not discharge on basal bolus        Author: Lonni SHAUNNA Dalton, MD 07/10/2024 8:19 AM  For on call review [email or web address].

## 2024-07-10 NOTE — Inpatient Diabetes Management (Signed)
 Inpatient Diabetes Program Recommendations  AACE/ADA: New Consensus Statement on Inpatient Glycemic Control (2015)  Target Ranges:  Prepandial:   less than 140 mg/dL      Peak postprandial:   less than 180 mg/dL (1-2 hours)      Critically ill patients:  140 - 180 mg/dL   Lab Results  Component Value Date   GLUCAP 248 (H) 07/10/2024   HGBA1C 7.6 (H) 07/04/2024    Review of Glycemic Control  Latest Reference Range & Units 07/09/24 06:18 07/09/24 08:10 07/09/24 11:13 07/09/24 12:51 07/09/24 16:15 07/09/24 21:33 07/09/24 21:55 07/10/24 06:09  Glucose-Capillary 70 - 99 mg/dL 750 (H) 717 (H) 726 (H) 308 (H) 302 (H) 300 (H) 289 (H) 248 (H)   Diabetes history: DM 2 Outpatient Diabetes medications: not taking metformin  listed and prescribed on med list Current orders for Inpatient glycemic control:  Semglee  10 units qhs Novolog  0-15 units tid + hs Novolog  4 units tid meal coverage  Pt received decadron  10 mg on 8/4, also did not get Semglee  dose the evening after surgery. Trends lower today from yesterday should continue to decrease. Will recheck lunchtime.  -   May need to increase meal coverage to 6 units, but will need to titrate back down as steroids clear.  Thanks,  Clotilda Bull RN, MSN, BC-ADM Inpatient Diabetes Coordinator Team Pager [phone] (8a-5p)

## 2024-07-11 LAB — GLUCOSE, CAPILLARY: Glucose-Capillary: 179 mg/dL — ABNORMAL HIGH (ref 70–99)

## 2024-07-11 MED ORDER — HYDROCODONE-ACETAMINOPHEN 5-325 MG PO TABS: 1.0000 | ORAL_TABLET | ORAL | 0 refills | Status: AC | PRN

## 2024-07-11 MED ORDER — TIZANIDINE HCL 4 MG PO TABS: 4.0000 mg | ORAL_TABLET | Freq: Three times a day (TID) | ORAL | 0 refills | Status: DC | PRN

## 2024-07-11 NOTE — Progress Notes (Signed)
 Nursing Discharge Note   Name: Theodore Hinton MRN: [id number] DOB: 02-07-1966    Admit Date:  07/03/2024  Discharge Date:  07/11/2024   Theodore Hinton is to be discharged home per MD order.  AVS completed. Reviewed with patient at bedside. Highlighted copy provided for patient to take home.  Patient able to verbalize understanding of discharge instructions. PIV removed. Patient stable upon discharge.   Patient educated and made aware that new prescriptions were sent in to home pharmacy of [facility] in [place], [place].  Declined for staff to escort to lobby. He ambulated with steady gait to lobby.    Discharge Instructions   None      Discharge Instructions     Call MD for:   Complete by: As directed    Call MD for:  difficulty breathing, headache or visual disturbances   Complete by: As directed    Call MD for:  hives   Complete by: As directed    Call MD for:  persistant nausea and vomiting   Complete by: As directed    Call MD for:  redness, tenderness, or signs of infection (pain, swelling, redness, odor or green/yellow discharge around incision site)   Complete by: As directed    Call MD for:  severe uncontrolled pain   Complete by: As directed    Call MD for:  temperature >100.4   Complete by: As directed    Diet - low sodium heart healthy   Complete by: As directed    Driving Restrictions   Complete by: As directed    No driving for 2 weeks, no riding in the car for 1 week   Increase activity slowly   Complete by: As directed    Lifting restrictions   Complete by: As directed    No lifting more than 8 lbs   Remove dressing in 24 hours   Complete by: As directed        Allergies as of 07/11/2024   No Known Allergies      Medication List     STOP taking these medications    clopidogrel  75 MG tablet Commonly known as: PLAVIX    oxyCODONE -acetaminophen  10-325 MG tablet Commonly known as: PERCOCET       TAKE these medications    amLODipine  10 MG  tablet Commonly known as: NORVASC  Take 1 tablet (10 mg total) by mouth daily.   atorvastatin  80 MG tablet Commonly known as: LIPITOR  Take 1 tablet (80 mg total) by mouth daily.   ezetimibe  10 MG tablet Commonly known as: ZETIA  Take 1 tablet (10 mg total) by mouth daily.   gabapentin  300 MG capsule Commonly known as: NEURONTIN  Take 1 capsule (300 mg total) by mouth 3 (three) times daily.   HYDROcodone -acetaminophen  5-325 MG tablet Commonly known as: NORCO/VICODIN Take 1 tablet by mouth every 4 (four) hours as needed for moderate pain (pain score 4-6).   lamoTRIgine  25 MG tablet Commonly known as: LaMICtal  Take 1 tablet (25 mg total) by mouth daily.   lisinopril  20 MG tablet Commonly known as: ZESTRIL  Take 1 tablet (20 mg total) by mouth daily.   metFORMIN  500 MG tablet Commonly known as: GLUCOPHAGE  Take 500 mg by mouth 2 (two) times daily.   metoprolol  tartrate 50 MG tablet Commonly known as: LOPRESSOR  Take 50 mg by mouth 2 (two) times daily.   naloxone 4 MG/0.1ML Liqd nasal spray kit Commonly known as: NARCAN Place 1 spray into the nose as directed.   ondansetron  4 MG  tablet Commonly known as: ZOFRAN  Take 1 tablet (4 mg total) by mouth every 8 (eight) hours as needed for nausea or vomiting.   tiZANidine  4 MG tablet Commonly known as: ZANAFLEX  Take 1 tablet (4 mg total) by mouth 3 (three) times daily as needed for muscle spasms.   Tums E-X 750 750 MG chewable tablet Generic drug: calcium  carbonate Chew 1 tablet by mouth 2 (two) times daily as needed for heartburn.         Discharge Instructions/ Education: An After Visit Summary was printed and given to the patient. Discharge instructions given to patient with verbalized understanding. Discharge education completed with patient including: follow up instructions, medication list, discharge activities, and limitations if indicated.  Patient and family able to verbalize understanding, all questions fully  answered. Patient instructed to return to Emergency Department, call 911, or call MD for any changes in condition.   Patient refused for staff to assist/escort him to lobby for discharge.  When asked, patient became belligerent and using foul language.  Ambulatory off unit for discharge home.

## 2024-07-11 NOTE — TOC Transition Note (Signed)
 Transition of Care [facility]) - Discharge Note   Patient Details  Name: Theodore Hinton MRN: [id number] Date of Birth: Apr 23, 1966  [facility] CM/SW Contact:  Andrez JULIANNA George, RN Phone Number: 07/11/2024, 8:27 AM   Clinical Narrative:     Pt is discharging home with self care. PT recommending outpatient therapy but pt will follow up in MD office prior to this being arranged.  Pt has transportation home.  Final next level of care: Home/Self Care Barriers to Discharge: No Barriers Identified   Patient Goals and CMS Choice            Discharge Placement                       Discharge Plan and Services Additional resources added to the After Visit Summary for                                       Social Drivers of Health (SDOH) Interventions SDOH Screenings   Food Insecurity: No Food Insecurity (07/04/2024)  Housing: Low Risk  (07/04/2024)  Transportation Needs: No Transportation Needs (07/04/2024)  Utilities: Not At Risk (07/04/2024)  Financial Resource Strain: Medium Risk (08/28/2023)   Received from [facility]  Social Connections: Unknown (12/28/2022)   Received from [facility]  Tobacco Use: Medium Risk (07/08/2024)     Readmission Risk Interventions     No data to display

## 2024-07-11 NOTE — Discharge Summary (Signed)
 Physician Discharge Summary  Patient ID: Theodore Hinton MRN: [id number] DOB/AGE: 05-Nov-1966 58 y.o.  Admit date: 07/03/2024 Discharge date: 07/11/2024  Admission Diagnoses: cervical myelopathy    Discharge Diagnoses: same   Discharged Condition: good  Hospital Course: The patient was admitted on 07/03/2024 and taken to the operating room where the patient underwent ACDF C3-4, 4-5, 5-6. The patient tolerated the procedure well and was taken to the recovery room and then to the floor in stable condition. The hospital course was routine. There were no complications. The wound remained clean dry and intact. Pt had appropriate neck soreness. No complaints of arm pain or new N/T/W. The patient remained afebrile with stable vital signs, and tolerated a regular diet. The patient continued to increase activities, and pain was well controlled with oral pain medications.   Consults: None  Significant Diagnostic Studies:  Results for orders placed or performed during the hospital encounter of 07/03/24  Ethanol   Collection Time: 07/03/24  4:34 PM  Result Value Ref Range   Alcohol, Ethyl (B) <15 <15 mg/dL  Protime-INR   Collection Time: 07/03/24  4:34 PM  Result Value Ref Range   Prothrombin Time 13.9 11.4 - 15.2 seconds   INR 1.0 0.8 - 1.2  APTT   Collection Time: 07/03/24  4:34 PM  Result Value Ref Range   aPTT 26 24 - 36 seconds  CBC   Collection Time: 07/03/24  4:34 PM  Result Value Ref Range   WBC 8.8 4.0 - 10.5 K/uL   RBC 5.75 4.22 - 5.81 MIL/uL   Hemoglobin 17.6 (H) 13.0 - 17.0 g/dL   HCT 47.6 (H) 60.9 - 47.9 %   MCV 91.0 80.0 - 100.0 fL   MCH 30.6 26.0 - 34.0 pg   MCHC 33.7 30.0 - 36.0 g/dL   RDW 86.0 88.4 - 84.4 %   Platelets 230 150 - 400 K/uL   nRBC 0.0 0.0 - 0.2 %  Differential   Collection Time: 07/03/24  4:34 PM  Result Value Ref Range   Neutrophils Relative % 68 %   Neutro Abs 6.0 1.7 - 7.7 K/uL   Lymphocytes Relative 22 %   Lymphs Abs 1.9 0.7 - 4.0 K/uL    Monocytes Relative 5 %   Monocytes Absolute 0.4 0.1 - 1.0 K/uL   Eosinophils Relative 4 %   Eosinophils Absolute 0.3 0.0 - 0.5 K/uL   Basophils Relative 1 %   Basophils Absolute 0.1 0.0 - 0.1 K/uL   Immature Granulocytes 0 %   Abs Immature Granulocytes 0.03 0.00 - 0.07 K/uL  Comprehensive metabolic panel   Collection Time: 07/03/24  4:34 PM  Result Value Ref Range   Sodium 137 135 - 145 mmol/L   Potassium 4.2 3.5 - 5.1 mmol/L   Chloride 107 98 - 111 mmol/L   CO2 20 (L) 22 - 32 mmol/L   Glucose, Bld 163 (H) 70 - 99 mg/dL   BUN 7 6 - 20 mg/dL   Creatinine, Ser 9.35 0.61 - 1.24 mg/dL   Calcium  8.9 8.9 - 10.3 mg/dL   Total Protein 6.1 (L) 6.5 - 8.1 g/dL   Albumin 3.4 (L) 3.5 - 5.0 g/dL   AST 25 15 - 41 U/L   ALT 16 0 - 44 U/L   Alkaline Phosphatase 64 38 - 126 U/L   Total Bilirubin 1.9 (H) 0.0 - 1.2 mg/dL   GFR, Estimated >39 >39 mL/min   Anion gap 10 5 - 15  ABO/Rh   Collection  Time: 07/03/24  4:34 PM  Result Value Ref Range   ABO/RH(D)      A POS Performed at [facility], [address] [place], [place] [address]   POC CBG, ED   Collection Time: 07/03/24  4:40 PM  Result Value Ref Range   Glucose-Capillary 181 (H) 70 - 99 mg/dL  I-stat chem 8, ED   Collection Time: 07/03/24  4:45 PM  Result Value Ref Range   Sodium 138 135 - 145 mmol/L   Potassium 4.0 3.5 - 5.1 mmol/L   Chloride 106 98 - 111 mmol/L   BUN 7 6 - 20 mg/dL   Creatinine, Ser 9.49 (L) 0.61 - 1.24 mg/dL   Glucose, Bld 833 (H) 70 - 99 mg/dL   Calcium , Ion 1.14 (L) 1.15 - 1.40 mmol/L   TCO2 23 22 - 32 mmol/L   Hemoglobin 18.4 (H) 13.0 - 17.0 g/dL   HCT 45.9 (H) 60.9 - 47.9 %  CBG monitoring, ED   Collection Time: 07/04/24  8:45 AM  Result Value Ref Range   Glucose-Capillary 115 (H) 70 - 99 mg/dL  CBG monitoring, ED   Collection Time: 07/04/24 12:35 PM  Result Value Ref Range   Glucose-Capillary 129 (H) 70 - 99 mg/dL  Hemoglobin J8r   Collection Time: 07/04/24  3:58 PM  Result Value Ref Range    Hgb A1c MFr Bld 7.6 (H) 4.8 - 5.6 %   Mean Plasma Glucose 171 mg/dL  Glucose, capillary   Collection Time: 07/04/24  4:16 PM  Result Value Ref Range   Glucose-Capillary 105 (H) 70 - 99 mg/dL   Comment 1 Notify RN   Glucose, capillary   Collection Time: 07/04/24  9:05 PM  Result Value Ref Range   Glucose-Capillary 99 70 - 99 mg/dL  Urine rapid drug screen (hosp performed)   Collection Time: 07/05/24  5:30 AM  Result Value Ref Range   Opiates NONE DETECTED NONE DETECTED   Cocaine NONE DETECTED NONE DETECTED   Benzodiazepines NONE DETECTED NONE DETECTED   Amphetamines NONE DETECTED NONE DETECTED   Tetrahydrocannabinol NONE DETECTED NONE DETECTED   Barbiturates NONE DETECTED NONE DETECTED  Glucose, capillary   Collection Time: 07/05/24  6:03 AM  Result Value Ref Range   Glucose-Capillary 134 (H) 70 - 99 mg/dL  Glucose, capillary   Collection Time: 07/05/24 11:35 AM  Result Value Ref Range   Glucose-Capillary 196 (H) 70 - 99 mg/dL   Comment 1 Notify RN   Surgical pcr screen   Collection Time: 07/05/24  2:08 PM   Specimen: Nasal Mucosa; Nasal Swab  Result Value Ref Range   MRSA, PCR NEGATIVE NEGATIVE   Staphylococcus aureus NEGATIVE NEGATIVE  Glucose, capillary   Collection Time: 07/05/24  4:29 PM  Result Value Ref Range   Glucose-Capillary 204 (H) 70 - 99 mg/dL   Comment 1 Notify RN   Glucose, capillary   Collection Time: 07/05/24  9:15 PM  Result Value Ref Range   Glucose-Capillary 146 (H) 70 - 99 mg/dL   Comment 1 Notify RN   Glucose, capillary   Collection Time: 07/06/24  6:28 AM  Result Value Ref Range   Glucose-Capillary 172 (H) 70 - 99 mg/dL   Comment 1 Notify RN   Glucose, capillary   Collection Time: 07/06/24 12:10 PM  Result Value Ref Range   Glucose-Capillary 91 70 - 99 mg/dL  Glucose, capillary   Collection Time: 07/06/24  4:42 PM  Result Value Ref Range  Glucose-Capillary 188 (H) 70 - 99 mg/dL  Glucose, capillary   Collection Time: 07/06/24  9:29  PM  Result Value Ref Range   Glucose-Capillary 98 70 - 99 mg/dL   Comment 1 Document in Chart   Glucose, capillary   Collection Time: 07/07/24  6:14 AM  Result Value Ref Range   Glucose-Capillary 130 (H) 70 - 99 mg/dL   Comment 1 Document in Chart   Glucose, capillary   Collection Time: 07/07/24 11:25 AM  Result Value Ref Range   Glucose-Capillary 128 (H) 70 - 99 mg/dL   Comment 1 Notify RN    Comment 2 Document in Chart   Glucose, capillary   Collection Time: 07/07/24  4:38 PM  Result Value Ref Range   Glucose-Capillary 212 (H) 70 - 99 mg/dL  Glucose, capillary   Collection Time: 07/07/24 10:53 PM  Result Value Ref Range   Glucose-Capillary 133 (H) 70 - 99 mg/dL   Comment 1 Notify RN    Comment 2 Document in Chart   Type and screen [facility]   Collection Time: 07/08/24  5:02 AM  Result Value Ref Range   ABO/RH(D) A POS    Antibody Screen NEG    Sample Expiration      07/11/2024,2359 Performed at [facility], [address] [place], [place] [address]   Glucose, capillary   Collection Time: 07/08/24  6:32 AM  Result Value Ref Range   Glucose-Capillary 144 (H) 70 - 99 mg/dL   Comment 1 Notify RN    Comment 2 Document in Chart   Glucose, capillary   Collection Time: 07/08/24 12:22 PM  Result Value Ref Range   Glucose-Capillary 89 70 - 99 mg/dL   Comment 1 Notify RN   Glucose, capillary   Collection Time: 07/08/24  3:15 PM  Result Value Ref Range   Glucose-Capillary 94 70 - 99 mg/dL   Comment 1 Notify RN   Glucose, capillary   Collection Time: 07/08/24  5:13 PM  Result Value Ref Range   Glucose-Capillary 93 70 - 99 mg/dL  Glucose, capillary   Collection Time: 07/08/24  7:15 PM  Result Value Ref Range   Glucose-Capillary 88 70 - 99 mg/dL  Glucose, capillary   Collection Time: 07/08/24 11:22 PM  Result Value Ref Range   Glucose-Capillary 169 (H) 70 - 99 mg/dL  Glucose, capillary   Collection Time: 07/09/24  6:18 AM  Result Value Ref  Range   Glucose-Capillary 249 (H) 70 - 99 mg/dL   Comment 1 Notify RN   Glucose, capillary   Collection Time: 07/09/24  8:10 AM  Result Value Ref Range   Glucose-Capillary 282 (H) 70 - 99 mg/dL  Glucose, capillary   Collection Time: 07/09/24 11:13 AM  Result Value Ref Range   Glucose-Capillary 273 (H) 70 - 99 mg/dL   Comment 1 Notify RN   Glucose, capillary   Collection Time: 07/09/24 12:51 PM  Result Value Ref Range   Glucose-Capillary 308 (H) 70 - 99 mg/dL  Glucose, capillary   Collection Time: 07/09/24  4:15 PM  Result Value Ref Range   Glucose-Capillary 302 (H) 70 - 99 mg/dL   Comment 1 Notify RN   Glucose, capillary   Collection Time: 07/09/24  9:33 PM  Result Value Ref Range   Glucose-Capillary 300 (H) 70 - 99 mg/dL  Glucose, capillary   Collection Time: 07/09/24  9:55 PM  Result Value Ref Range   Glucose-Capillary 289 (H) 70 - 99  mg/dL   Comment 1 Document in Chart   Glucose, capillary   Collection Time: 07/10/24  6:09 AM  Result Value Ref Range   Glucose-Capillary 248 (H) 70 - 99 mg/dL   Comment 1 Notify RN   Glucose, capillary   Collection Time: 07/10/24 11:44 AM  Result Value Ref Range   Glucose-Capillary 181 (H) 70 - 99 mg/dL  Glucose, capillary   Collection Time: 07/10/24  4:26 PM  Result Value Ref Range   Glucose-Capillary 189 (H) 70 - 99 mg/dL  Glucose, capillary   Collection Time: 07/10/24 10:48 PM  Result Value Ref Range   Glucose-Capillary 194 (H) 70 - 99 mg/dL   Comment 1 Notify RN    Comment 2 Document in Chart   Glucose, capillary   Collection Time: 07/11/24  6:25 AM  Result Value Ref Range   Glucose-Capillary 179 (H) 70 - 99 mg/dL   Comment 1 Notify RN    Comment 2 Document in Chart     DG Cervical Spine 1 View Result Date: 07/08/2024 CLINICAL DATA:  Elective surgery. EXAM: DG CERVICAL SPINE - 1 VIEW COMPARISON:  Recent MRI FINDINGS: Four fluoroscopic spot views of the cervical spine submitted from the operating room. Imaging obtained  during anterior fusion C3 through C6 with interbody spacers. Fluoroscopy time 12 seconds. Dose 1.44 mGy. IMPRESSION: Intraoperative fluoroscopy during cervical fusion. Electronically Signed   By: Andrea Gasman M.D.   On: 07/08/2024 23:01   DG C-Arm 1-60 Min-No Report Result Date: 07/08/2024 Fluoroscopy was utilized by the requesting physician.  No radiographic interpretation.   DG C-Arm 1-60 Min-No Report Result Date: 07/08/2024 Fluoroscopy was utilized by the requesting physician.  No radiographic interpretation.   MR BRAIN WO CONTRAST Result Date: 07/03/2024 CLINICAL DATA:  Cervical radiculopathy, no red flags; Neuro deficit, acute, stroke suspected; Low back pain, cauda equina syndrome suspected; Myelopathy, acute, thoracic spine. EXAM: MRI CERVICAL, THORACIC AND LUMBAR SPINE WITHOUT CONTRAST TECHNIQUE: Multiplanar and multiecho pulse sequences of the cervical spine, to include the craniocervical junction and cervicothoracic junction, and thoracic and lumbar spine, were obtained without intravenous contrast. COMPARISON:  None Available. FINDINGS: MRI HEAD FINDINGS Brain: No acute infarction, hemorrhage, hydrocephalus, extra-axial collection or mass lesion. The a benign right basal ganglia dilated perivascular space. Vascular: Major arterial flow voids are maintained at the skull base. Skull and upper cervical spine: Normal marrow signal. Sinuses/Orbits: Paranasal sinus mucosal thickening with air-fluid levels in the sphenoid sinuses. Other: No mastoid effusions MRI CERVICAL SPINE FINDINGS Alignment: No substantial sagittal subluxation. Vertebrae: No fracture, evidence of discitis, or bone lesion. Cord: Normal cord signal. Posterior Fossa, vertebral arteries, paraspinal tissues: Visualized vertebral artery flow voids are maintained. Disc levels: C2-C3: Bilateral facet and uncovertebral hypertrophy with mild left greater than right foraminal stenosis. Patent canal. C3-C4: Bilateral facet and uncovertebral  hypertrophy with moderate left greater than right foraminal stenosis. Mild canal stenosis. C4-C5: Posterior disc osteophyte complex with bilateral facet and uncovertebral hypertrophy. Resulting severe bilateral foraminal stenosis and moderate canal stenosis. C5-C6: Posterior disc osteophyte complex with left greater than right facet and uncovertebral hypertrophy. Resulting moderate to severe bilateral foraminal stenosis. Mild canal stenosis. C6-C7: Posterior disc osteophyte complex with bilateral facet and uncovertebral hypertrophy. Resulting mild right foraminal stenosis. Mild canal stenosis. C7-T1: No significant disc protrusion, foraminal stenosis, or canal stenosis. MRI THORACIC SPINE FINDINGS Alignment:  Normal. Vertebrae: No fracture, evidence of discitis, or bone lesion. Cord:  Normal cord signal. Paraspinal and other soft tissues: Unremarkable. Disc levels: Central disc protrusion  at T7-T8 contacts and flattens the ventral cord without significant canal stenosis. Multilevel facet arthropathy with moderate left and mild right foraminal stenosis at T9-T10. MRI LUMBAR SPINE FINDINGS Segmentation: Standard. Alignment:  No substantial sagittal subluxation. Vertebrae:  No fracture, evidence of discitis, or bone lesion. Conus medullaris and cauda equina: Conus extends to the L1-L2 level. Conus and cauda equina appear normal. Paraspinal and other soft tissues: Unremarkable. Disc levels: T12-L1: No significant disc protrusion, foraminal stenosis, or canal stenosis. L1-L2: No significant disc protrusion, foraminal stenosis, or canal stenosis. L2-L3: Mild disc bulging with mild canal stenosis. No significant foraminal stenosis. L3-L4: Broad disc bulging and bilateral endplate spurring with facet arthropathy. Resulting mild left greater than right foraminal stenosis. Mild canal stenosis. L4-L5: Broad disc bulge with bilateral facet arthropathy. Resulting mild canal and bilateral foraminal stenosis. L5-S1: Disc bulging  endplate spurring. Right greater than left facet arthropathy. Resulting moderate right foraminal stenosis with far lateral disc/osteophyte closely approximating the exiting/exited right L5 nerve. Mild left foraminal stenosis. Patent canal. IMPRESSION: MRI HEAD: No evidence of acute intracranial abnormality. MRI CERVICAL SPINE: 1. At C4-C5, severe bilateral foraminal stenosis and moderate canal stenosis. 2. At C5-C6, moderate to severe bilateral foraminal stenosis and mild canal stenosis. 3. At C3-C4, moderate left greater than right foraminal stenosis and mild canal stenosis. MRI THORACIC SPINE: 1. At T7-T8, central disc protrusion contacts and flattens the ventral cord without significant canal stenosis. 2. At T9-T10, moderate left and mild right foraminal stenosis. MRI LUMBAR SPINE: 1. At L5-S1, moderate right foraminal stenosis with far lateral disc/osteophyte closely approximating the exiting/exited right L5 nerve. 2. At L3-L4 and L4-L5, mild canal and bilateral foraminal stenosis. Electronically Signed   By: Gilmore GORMAN Molt M.D.   On: 07/03/2024 20:38   MR Cervical Spine Wo Contrast Result Date: 07/03/2024 CLINICAL DATA:  Cervical radiculopathy, no red flags; Neuro deficit, acute, stroke suspected; Low back pain, cauda equina syndrome suspected; Myelopathy, acute, thoracic spine. EXAM: MRI CERVICAL, THORACIC AND LUMBAR SPINE WITHOUT CONTRAST TECHNIQUE: Multiplanar and multiecho pulse sequences of the cervical spine, to include the craniocervical junction and cervicothoracic junction, and thoracic and lumbar spine, were obtained without intravenous contrast. COMPARISON:  None Available. FINDINGS: MRI HEAD FINDINGS Brain: No acute infarction, hemorrhage, hydrocephalus, extra-axial collection or mass lesion. The a benign right basal ganglia dilated perivascular space. Vascular: Major arterial flow voids are maintained at the skull base. Skull and upper cervical spine: Normal marrow signal. Sinuses/Orbits:  Paranasal sinus mucosal thickening with air-fluid levels in the sphenoid sinuses. Other: No mastoid effusions MRI CERVICAL SPINE FINDINGS Alignment: No substantial sagittal subluxation. Vertebrae: No fracture, evidence of discitis, or bone lesion. Cord: Normal cord signal. Posterior Fossa, vertebral arteries, paraspinal tissues: Visualized vertebral artery flow voids are maintained. Disc levels: C2-C3: Bilateral facet and uncovertebral hypertrophy with mild left greater than right foraminal stenosis. Patent canal. C3-C4: Bilateral facet and uncovertebral hypertrophy with moderate left greater than right foraminal stenosis. Mild canal stenosis. C4-C5: Posterior disc osteophyte complex with bilateral facet and uncovertebral hypertrophy. Resulting severe bilateral foraminal stenosis and moderate canal stenosis. C5-C6: Posterior disc osteophyte complex with left greater than right facet and uncovertebral hypertrophy. Resulting moderate to severe bilateral foraminal stenosis. Mild canal stenosis. C6-C7: Posterior disc osteophyte complex with bilateral facet and uncovertebral hypertrophy. Resulting mild right foraminal stenosis. Mild canal stenosis. C7-T1: No significant disc protrusion, foraminal stenosis, or canal stenosis. MRI THORACIC SPINE FINDINGS Alignment:  Normal. Vertebrae: No fracture, evidence of discitis, or bone lesion. Cord:  Normal cord signal. Paraspinal  and other soft tissues: Unremarkable. Disc levels: Central disc protrusion at T7-T8 contacts and flattens the ventral cord without significant canal stenosis. Multilevel facet arthropathy with moderate left and mild right foraminal stenosis at T9-T10. MRI LUMBAR SPINE FINDINGS Segmentation: Standard. Alignment:  No substantial sagittal subluxation. Vertebrae:  No fracture, evidence of discitis, or bone lesion. Conus medullaris and cauda equina: Conus extends to the L1-L2 level. Conus and cauda equina appear normal. Paraspinal and other soft tissues:  Unremarkable. Disc levels: T12-L1: No significant disc protrusion, foraminal stenosis, or canal stenosis. L1-L2: No significant disc protrusion, foraminal stenosis, or canal stenosis. L2-L3: Mild disc bulging with mild canal stenosis. No significant foraminal stenosis. L3-L4: Broad disc bulging and bilateral endplate spurring with facet arthropathy. Resulting mild left greater than right foraminal stenosis. Mild canal stenosis. L4-L5: Broad disc bulge with bilateral facet arthropathy. Resulting mild canal and bilateral foraminal stenosis. L5-S1: Disc bulging endplate spurring. Right greater than left facet arthropathy. Resulting moderate right foraminal stenosis with far lateral disc/osteophyte closely approximating the exiting/exited right L5 nerve. Mild left foraminal stenosis. Patent canal. IMPRESSION: MRI HEAD: No evidence of acute intracranial abnormality. MRI CERVICAL SPINE: 1. At C4-C5, severe bilateral foraminal stenosis and moderate canal stenosis. 2. At C5-C6, moderate to severe bilateral foraminal stenosis and mild canal stenosis. 3. At C3-C4, moderate left greater than right foraminal stenosis and mild canal stenosis. MRI THORACIC SPINE: 1. At T7-T8, central disc protrusion contacts and flattens the ventral cord without significant canal stenosis. 2. At T9-T10, moderate left and mild right foraminal stenosis. MRI LUMBAR SPINE: 1. At L5-S1, moderate right foraminal stenosis with far lateral disc/osteophyte closely approximating the exiting/exited right L5 nerve. 2. At L3-L4 and L4-L5, mild canal and bilateral foraminal stenosis. Electronically Signed   By: Gilmore GORMAN Molt M.D.   On: 07/03/2024 20:38   MR LUMBAR SPINE WO CONTRAST Result Date: 07/03/2024 CLINICAL DATA:  Cervical radiculopathy, no red flags; Neuro deficit, acute, stroke suspected; Low back pain, cauda equina syndrome suspected; Myelopathy, acute, thoracic spine. EXAM: MRI CERVICAL, THORACIC AND LUMBAR SPINE WITHOUT CONTRAST TECHNIQUE:  Multiplanar and multiecho pulse sequences of the cervical spine, to include the craniocervical junction and cervicothoracic junction, and thoracic and lumbar spine, were obtained without intravenous contrast. COMPARISON:  None Available. FINDINGS: MRI HEAD FINDINGS Brain: No acute infarction, hemorrhage, hydrocephalus, extra-axial collection or mass lesion. The a benign right basal ganglia dilated perivascular space. Vascular: Major arterial flow voids are maintained at the skull base. Skull and upper cervical spine: Normal marrow signal. Sinuses/Orbits: Paranasal sinus mucosal thickening with air-fluid levels in the sphenoid sinuses. Other: No mastoid effusions MRI CERVICAL SPINE FINDINGS Alignment: No substantial sagittal subluxation. Vertebrae: No fracture, evidence of discitis, or bone lesion. Cord: Normal cord signal. Posterior Fossa, vertebral arteries, paraspinal tissues: Visualized vertebral artery flow voids are maintained. Disc levels: C2-C3: Bilateral facet and uncovertebral hypertrophy with mild left greater than right foraminal stenosis. Patent canal. C3-C4: Bilateral facet and uncovertebral hypertrophy with moderate left greater than right foraminal stenosis. Mild canal stenosis. C4-C5: Posterior disc osteophyte complex with bilateral facet and uncovertebral hypertrophy. Resulting severe bilateral foraminal stenosis and moderate canal stenosis. C5-C6: Posterior disc osteophyte complex with left greater than right facet and uncovertebral hypertrophy. Resulting moderate to severe bilateral foraminal stenosis. Mild canal stenosis. C6-C7: Posterior disc osteophyte complex with bilateral facet and uncovertebral hypertrophy. Resulting mild right foraminal stenosis. Mild canal stenosis. C7-T1: No significant disc protrusion, foraminal stenosis, or canal stenosis. MRI THORACIC SPINE FINDINGS Alignment:  Normal. Vertebrae: No fracture, evidence of  discitis, or bone lesion. Cord:  Normal cord signal. Paraspinal  and other soft tissues: Unremarkable. Disc levels: Central disc protrusion at T7-T8 contacts and flattens the ventral cord without significant canal stenosis. Multilevel facet arthropathy with moderate left and mild right foraminal stenosis at T9-T10. MRI LUMBAR SPINE FINDINGS Segmentation: Standard. Alignment:  No substantial sagittal subluxation. Vertebrae:  No fracture, evidence of discitis, or bone lesion. Conus medullaris and cauda equina: Conus extends to the L1-L2 level. Conus and cauda equina appear normal. Paraspinal and other soft tissues: Unremarkable. Disc levels: T12-L1: No significant disc protrusion, foraminal stenosis, or canal stenosis. L1-L2: No significant disc protrusion, foraminal stenosis, or canal stenosis. L2-L3: Mild disc bulging with mild canal stenosis. No significant foraminal stenosis. L3-L4: Broad disc bulging and bilateral endplate spurring with facet arthropathy. Resulting mild left greater than right foraminal stenosis. Mild canal stenosis. L4-L5: Broad disc bulge with bilateral facet arthropathy. Resulting mild canal and bilateral foraminal stenosis. L5-S1: Disc bulging endplate spurring. Right greater than left facet arthropathy. Resulting moderate right foraminal stenosis with far lateral disc/osteophyte closely approximating the exiting/exited right L5 nerve. Mild left foraminal stenosis. Patent canal. IMPRESSION: MRI HEAD: No evidence of acute intracranial abnormality. MRI CERVICAL SPINE: 1. At C4-C5, severe bilateral foraminal stenosis and moderate canal stenosis. 2. At C5-C6, moderate to severe bilateral foraminal stenosis and mild canal stenosis. 3. At C3-C4, moderate left greater than right foraminal stenosis and mild canal stenosis. MRI THORACIC SPINE: 1. At T7-T8, central disc protrusion contacts and flattens the ventral cord without significant canal stenosis. 2. At T9-T10, moderate left and mild right foraminal stenosis. MRI LUMBAR SPINE: 1. At L5-S1, moderate right  foraminal stenosis with far lateral disc/osteophyte closely approximating the exiting/exited right L5 nerve. 2. At L3-L4 and L4-L5, mild canal and bilateral foraminal stenosis. Electronically Signed   By: Gilmore GORMAN Molt M.D.   On: 07/03/2024 20:38   MR THORACIC SPINE WO CONTRAST Result Date: 07/03/2024 CLINICAL DATA:  Cervical radiculopathy, no red flags; Neuro deficit, acute, stroke suspected; Low back pain, cauda equina syndrome suspected; Myelopathy, acute, thoracic spine. EXAM: MRI CERVICAL, THORACIC AND LUMBAR SPINE WITHOUT CONTRAST TECHNIQUE: Multiplanar and multiecho pulse sequences of the cervical spine, to include the craniocervical junction and cervicothoracic junction, and thoracic and lumbar spine, were obtained without intravenous contrast. COMPARISON:  None Available. FINDINGS: MRI HEAD FINDINGS Brain: No acute infarction, hemorrhage, hydrocephalus, extra-axial collection or mass lesion. The a benign right basal ganglia dilated perivascular space. Vascular: Major arterial flow voids are maintained at the skull base. Skull and upper cervical spine: Normal marrow signal. Sinuses/Orbits: Paranasal sinus mucosal thickening with air-fluid levels in the sphenoid sinuses. Other: No mastoid effusions MRI CERVICAL SPINE FINDINGS Alignment: No substantial sagittal subluxation. Vertebrae: No fracture, evidence of discitis, or bone lesion. Cord: Normal cord signal. Posterior Fossa, vertebral arteries, paraspinal tissues: Visualized vertebral artery flow voids are maintained. Disc levels: C2-C3: Bilateral facet and uncovertebral hypertrophy with mild left greater than right foraminal stenosis. Patent canal. C3-C4: Bilateral facet and uncovertebral hypertrophy with moderate left greater than right foraminal stenosis. Mild canal stenosis. C4-C5: Posterior disc osteophyte complex with bilateral facet and uncovertebral hypertrophy. Resulting severe bilateral foraminal stenosis and moderate canal stenosis. C5-C6:  Posterior disc osteophyte complex with left greater than right facet and uncovertebral hypertrophy. Resulting moderate to severe bilateral foraminal stenosis. Mild canal stenosis. C6-C7: Posterior disc osteophyte complex with bilateral facet and uncovertebral hypertrophy. Resulting mild right foraminal stenosis. Mild canal stenosis. C7-T1: No significant disc protrusion, foraminal stenosis, or canal stenosis. MRI  THORACIC SPINE FINDINGS Alignment:  Normal. Vertebrae: No fracture, evidence of discitis, or bone lesion. Cord:  Normal cord signal. Paraspinal and other soft tissues: Unremarkable. Disc levels: Central disc protrusion at T7-T8 contacts and flattens the ventral cord without significant canal stenosis. Multilevel facet arthropathy with moderate left and mild right foraminal stenosis at T9-T10. MRI LUMBAR SPINE FINDINGS Segmentation: Standard. Alignment:  No substantial sagittal subluxation. Vertebrae:  No fracture, evidence of discitis, or bone lesion. Conus medullaris and cauda equina: Conus extends to the L1-L2 level. Conus and cauda equina appear normal. Paraspinal and other soft tissues: Unremarkable. Disc levels: T12-L1: No significant disc protrusion, foraminal stenosis, or canal stenosis. L1-L2: No significant disc protrusion, foraminal stenosis, or canal stenosis. L2-L3: Mild disc bulging with mild canal stenosis. No significant foraminal stenosis. L3-L4: Broad disc bulging and bilateral endplate spurring with facet arthropathy. Resulting mild left greater than right foraminal stenosis. Mild canal stenosis. L4-L5: Broad disc bulge with bilateral facet arthropathy. Resulting mild canal and bilateral foraminal stenosis. L5-S1: Disc bulging endplate spurring. Right greater than left facet arthropathy. Resulting moderate right foraminal stenosis with far lateral disc/osteophyte closely approximating the exiting/exited right L5 nerve. Mild left foraminal stenosis. Patent canal. IMPRESSION: MRI HEAD: No  evidence of acute intracranial abnormality. MRI CERVICAL SPINE: 1. At C4-C5, severe bilateral foraminal stenosis and moderate canal stenosis. 2. At C5-C6, moderate to severe bilateral foraminal stenosis and mild canal stenosis. 3. At C3-C4, moderate left greater than right foraminal stenosis and mild canal stenosis. MRI THORACIC SPINE: 1. At T7-T8, central disc protrusion contacts and flattens the ventral cord without significant canal stenosis. 2. At T9-T10, moderate left and mild right foraminal stenosis. MRI LUMBAR SPINE: 1. At L5-S1, moderate right foraminal stenosis with far lateral disc/osteophyte closely approximating the exiting/exited right L5 nerve. 2. At L3-L4 and L4-L5, mild canal and bilateral foraminal stenosis. Electronically Signed   By: Gilmore GORMAN Molt M.D.   On: 07/03/2024 20:38   CT HEAD CODE STROKE WO CONTRAST Result Date: 07/03/2024 CLINICAL DATA:  Code stroke. Neuro deficit, acute, stroke suspected. Left-sided weakness. EXAM: CT HEAD WITHOUT CONTRAST TECHNIQUE: Contiguous axial images were obtained from the base of the skull through the vertex without intravenous contrast. RADIATION DOSE REDUCTION: This exam was performed according to the departmental dose-optimization program which includes automated exposure control, adjustment of the mA and/or kV according to patient size and/or use of iterative reconstruction technique. COMPARISON:  CT angiogram head/neck 12/07/2023. FINDINGS: Brain: Mild generalized cerebral atrophy. Known chronic lacunar infarct within the left thalamus. Prominent perivascular spaces within the right basal ganglia inferiorly, unchanged. There is no acute intracranial hemorrhage. No demarcated cortical infarct. No extra-axial fluid collection. No evidence of an intracranial mass. No midline shift. Vascular: No hyperdense vessel. Atherosclerotic calcifications. Skull: No calvarial fracture or aggressive osseous lesion. Sinuses/Orbits: No mass or acute finding within  the imaged orbits. Mild mucosal thickening within the bilateral maxillary sinuses at the imaged levels. Severe bilateral sphenoid and ethmoid sinusitis. Hypoplastic and opacified right frontal sinus. Moderate left frontal sinusitis. ASPECTS [facility] Stroke Program Early CT Score) - Ganglionic level infarction (caudate, lentiform nuclei, internal capsule, insula, M1-M3 cortex): 7 - Supraganglionic infarction (M4-M6 cortex): 3 Total score (0-10 with 10 being normal): 10 No evidence of an acute intracranial abnormality. These results were communicated to Dr. Vanessa at 4:54 pmon 7/30/2025by text page via the [facility] messaging system. IMPRESSION: 1. No evidence of an acute intracranial abnormality. 2. Known chronic lacunar infarct within the left thalamus. 3. Mild generalized cerebral atrophy. 4.  Paranasal sinus disease at the imaged levels, as described. Correlate for signs/symptoms of acute sinusitis. Electronically Signed   By: Rockey Childs D.O.   On: 07/03/2024 16:55    Antibiotics:  Anti-infectives (From admission, onward)    Start     Dose/Rate Route Frequency Ordered Stop   07/09/24 0400  ceFAZolin  (ANCEF ) IVPB 2g/100 mL premix        2 g 200 mL/hr over 30 Minutes Intravenous Every 8 hours 07/09/24 0005 07/10/24 2043       Discharge Exam: Blood pressure (!) 146/82, pulse (!) 57, temperature 98 F (36.7 C), resp. rate 18, height 5' 8 (1.727 m), weight 83 kg, SpO2 97%. Neurologic: Grossly normal Ambulating and voiding well incision cdi   Discharge Medications:   Allergies as of 07/11/2024   No Known Allergies      Medication List     STOP taking these medications    clopidogrel  75 MG tablet Commonly known as: PLAVIX    oxyCODONE -acetaminophen  10-325 MG tablet Commonly known as: PERCOCET       TAKE these medications    amLODipine  10 MG tablet Commonly known as: NORVASC  Take 1 tablet (10 mg total) by mouth daily.   atorvastatin  80 MG tablet Commonly known as:  LIPITOR  Take 1 tablet (80 mg total) by mouth daily.   ezetimibe  10 MG tablet Commonly known as: ZETIA  Take 1 tablet (10 mg total) by mouth daily.   gabapentin  300 MG capsule Commonly known as: NEURONTIN  Take 1 capsule (300 mg total) by mouth 3 (three) times daily.   HYDROcodone -acetaminophen  5-325 MG tablet Commonly known as: NORCO/VICODIN Take 1 tablet by mouth every 4 (four) hours as needed for moderate pain (pain score 4-6).   lamoTRIgine  25 MG tablet Commonly known as: LaMICtal  Take 1 tablet (25 mg total) by mouth daily.   lisinopril  20 MG tablet Commonly known as: ZESTRIL  Take 1 tablet (20 mg total) by mouth daily.   metFORMIN  500 MG tablet Commonly known as: GLUCOPHAGE  Take 500 mg by mouth 2 (two) times daily.   metoprolol  tartrate 50 MG tablet Commonly known as: LOPRESSOR  Take 50 mg by mouth 2 (two) times daily.   naloxone 4 MG/0.1ML Liqd nasal spray kit Commonly known as: NARCAN Place 1 spray into the nose as directed.   ondansetron  4 MG tablet Commonly known as: ZOFRAN  Take 1 tablet (4 mg total) by mouth every 8 (eight) hours as needed for nausea or vomiting.   tiZANidine  4 MG tablet Commonly known as: ZANAFLEX  Take 1 tablet (4 mg total) by mouth 3 (three) times daily as needed for muscle spasms.   Tums E-X 750 750 MG chewable tablet Generic drug: calcium  carbonate Chew 1 tablet by mouth 2 (two) times daily as needed for heartburn.        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, 4-5, 5-6  Discharge Instructions     Call MD for:   Complete by: As directed    Call MD for:  difficulty breathing, headache or visual disturbances   Complete by: As directed    Call MD for:  hives   Complete by: As directed    Call MD for:  persistant nausea and vomiting   Complete by: As directed    Call MD for:  redness, tenderness, or signs of infection (pain, swelling, redness, odor or green/yellow discharge around incision site)   Complete by: As directed     Call MD for:  severe uncontrolled pain   Complete by:  As directed    Call MD for:  temperature >100.4   Complete by: As directed    Diet - low sodium heart healthy   Complete by: As directed    Driving Restrictions   Complete by: As directed    No driving for 2 weeks, no riding in the car for 1 week   Increase activity slowly   Complete by: As directed    Lifting restrictions   Complete by: As directed    No lifting more than 8 lbs   Remove dressing in 24 hours   Complete by: As directed           Signed: Suzen Lacks Malike Foglio 07/11/2024, 7:44 AM

## 2024-07-11 NOTE — Plan of Care (Signed)
 Problem: Education: Goal: Ability to describe self-care measures that may prevent or decrease complications (Diabetes Survival Skills Education) will improve Outcome: Progressing Goal: Individualized Educational Video(s) Outcome: Progressing   Problem: Coping: Goal: Ability to adjust to condition or change in health will improve Outcome: Progressing   Problem: Fluid Volume: Goal: Ability to maintain a balanced intake and output will improve Outcome: Progressing   Problem: Health Behavior/Discharge Planning: Goal: Ability to identify and utilize available resources and services will improve Outcome: Progressing Goal: Ability to manage health-related needs will improve Outcome: Progressing   Problem: Metabolic: Goal: Ability to maintain appropriate glucose levels will improve Outcome: Progressing   Problem: Nutritional: Goal: Maintenance of adequate nutrition will improve Outcome: Progressing Goal: Progress toward achieving an optimal weight will improve Outcome: Progressing   Problem: Skin Integrity: Goal: Risk for impaired skin integrity will decrease Outcome: Progressing   Problem: Tissue Perfusion: Goal: Adequacy of tissue perfusion will improve Outcome: Progressing   Problem: Education: Goal: Knowledge of General Education information will improve Description: Including pain rating scale, medication(s)/side effects and non-pharmacologic comfort measures Outcome: Progressing   Problem: Health Behavior/Discharge Planning: Goal: Ability to manage health-related needs will improve Outcome: Progressing   Problem: Clinical Measurements: Goal: Ability to maintain clinical measurements within normal limits will improve Outcome: Progressing Goal: Will remain free from infection Outcome: Progressing Goal: Diagnostic test results will improve Outcome: Progressing Goal: Respiratory complications will improve Outcome: Progressing Goal: Cardiovascular complication will  be avoided Outcome: Progressing   Problem: Activity: Goal: Risk for activity intolerance will decrease Outcome: Progressing   Problem: Nutrition: Goal: Adequate nutrition will be maintained Outcome: Progressing   Problem: Coping: Goal: Level of anxiety will decrease Outcome: Progressing   Problem: Elimination: Goal: Will not experience complications related to bowel motility Outcome: Progressing Goal: Will not experience complications related to urinary retention Outcome: Progressing   Problem: Pain Managment: Goal: General experience of comfort will improve and/or be controlled Outcome: Progressing   Problem: Safety: Goal: Ability to remain free from injury will improve Outcome: Progressing   Problem: Skin Integrity: Goal: Risk for impaired skin integrity will decrease Outcome: Progressing   Problem: Education: Goal: Ability to verbalize activity precautions or restrictions will improve Outcome: Progressing Goal: Knowledge of the prescribed therapeutic regimen will improve Outcome: Progressing Goal: Understanding of discharge needs will improve Outcome: Progressing   Problem: Activity: Goal: Ability to avoid complications of mobility impairment will improve Outcome: Progressing Goal: Ability to tolerate increased activity will improve Outcome: Progressing Goal: Will remain free from falls Outcome: Progressing   Problem: Education: Goal: Knowledge of General Education information will improve Description: Including pain rating scale, medication(s)/side effects and non-pharmacologic comfort measures Outcome: Progressing   Problem: Health Behavior/Discharge Planning: Goal: Ability to manage health-related needs will improve Outcome: Progressing   Problem: Clinical Measurements: Goal: Ability to maintain clinical measurements within normal limits will improve Outcome: Progressing Goal: Will remain free from infection Outcome: Progressing Goal: Diagnostic  test results will improve Outcome: Progressing Goal: Respiratory complications will improve Outcome: Progressing Goal: Cardiovascular complication will be avoided Outcome: Progressing   Problem: Activity: Goal: Risk for activity intolerance will decrease Outcome: Progressing   Problem: Nutrition: Goal: Adequate nutrition will be maintained Outcome: Progressing   Problem: Coping: Goal: Level of anxiety will decrease Outcome: Progressing   Problem: Elimination: Goal: Will not experience complications related to bowel motility Outcome: Progressing Goal: Will not experience complications related to urinary retention Outcome: Progressing   Problem: Pain Managment: Goal: General experience of comfort will improve and/or  be controlled Outcome: Progressing   Problem: Safety: Goal: Ability to remain free from injury will improve Outcome: Progressing   Problem: Skin Integrity: Goal: Risk for impaired skin integrity will decrease Outcome: Progressing

## 2024-07-11 NOTE — Progress Notes (Signed)
 This is a no charge note.  Patient discharged this morning by Neurosurgery.  I agree with that plan.    Given Decadron  and surgery, the hyperglycemia looks transient.  I would favor resumption of his home orals and close follow up with PCP (he has appt next week he tells me).  I explained as much to patient, recommended low carbohydrate intake and close PCP follow up.

## 2024-07-11 NOTE — Plan of Care (Signed)
 Plan of care is reviewed. Pt has been progressing. He is alert, oriented x 4, afebrile, stable hemodynamically, no acute distress noted overnight. Neurological signs are intact. Pain is his major complaints. He is able to tolerated, with pain controlled  from Oxycodone  10 mg PRN q 3-4 hrs, and Zanaflex  PRN. Pt is able to rest well tonight. The incision site on his neck with dry dressing is clean, no drainage. We will continue to monitor.   Problem: Clinical Measurements: Goal: Ability to maintain clinical measurements within normal limits will improve Outcome: Progressing Goal: Will remain free from infection Outcome: Progressing Goal: Diagnostic test results will improve Outcome: Progressing Goal: Respiratory complications will improve Outcome: Progressing Goal: Cardiovascular complication will be avoided Outcome: Progressing   Problem: Elimination: Goal: Will not experience complications related to bowel motility Outcome: Progressing Goal: Will not experience complications related to urinary retention Outcome: Progressing   Problem: Skin Integrity: Goal: Risk for impaired skin integrity will decrease Outcome: Progressing   Problem: Pain Managment: Goal: General experience of comfort will improve and/or be controlled Outcome: Progressing   Problem: Health Behavior/Discharge Planning: Goal: Ability to manage health-related needs will improve Outcome: Progressing   Wendi Dash, RN

## 2024-07-14 ENCOUNTER — Emergency Department (HOSPITAL_COMMUNITY)

## 2024-07-14 ENCOUNTER — Other Ambulatory Visit: Payer: Self-pay

## 2024-07-14 ENCOUNTER — Emergency Department (HOSPITAL_COMMUNITY)
Admission: EM | Admit: 2024-07-14 | Discharge: 2024-07-14 | Disposition: A | Discharge status: Home / Self Care | Attending: Emergency Medicine | Admitting: Emergency Medicine

## 2024-07-14 ENCOUNTER — Encounter (HOSPITAL_COMMUNITY): Payer: Self-pay

## 2024-07-14 DIAGNOSIS — Z79899 Other long term (current) drug therapy: Secondary | ICD-10-CM | POA: Insufficient documentation

## 2024-07-14 DIAGNOSIS — I10 Essential (primary) hypertension: Secondary | ICD-10-CM | POA: Diagnosis not present

## 2024-07-14 DIAGNOSIS — M542 Cervicalgia: Secondary | ICD-10-CM | POA: Diagnosis present

## 2024-07-14 LAB — CBC WITH DIFFERENTIAL/PLATELET
Abs Immature Granulocytes: 0.04 K/uL (ref 0.00–0.07)
Basophils Absolute: 0.1 K/uL (ref 0.0–0.1)
Basophils Relative: 1 %
Eosinophils Absolute: 0.6 K/uL — ABNORMAL HIGH (ref 0.0–0.5)
Eosinophils Relative: 6 %
HCT: 50.4 % (ref 39.0–52.0)
Hemoglobin: 17.1 g/dL — ABNORMAL HIGH (ref 13.0–17.0)
Immature Granulocytes: 0 %
Lymphocytes Relative: 18 %
Lymphs Abs: 1.9 K/uL (ref 0.7–4.0)
MCH: 30.8 pg (ref 26.0–34.0)
MCHC: 33.9 g/dL (ref 30.0–36.0)
MCV: 90.6 fL (ref 80.0–100.0)
Monocytes Absolute: 0.7 K/uL (ref 0.1–1.0)
Monocytes Relative: 7 %
Neutro Abs: 7.4 K/uL (ref 1.7–7.7)
Neutrophils Relative %: 68 %
Platelets: 223 K/uL (ref 150–400)
RBC: 5.56 MIL/uL (ref 4.22–5.81)
RDW: 13.1 % (ref 11.5–15.5)
WBC: 10.6 K/uL — ABNORMAL HIGH (ref 4.0–10.5)
nRBC: 0 % (ref 0.0–0.2)

## 2024-07-14 LAB — COMPREHENSIVE METABOLIC PANEL WITH GFR
ALT: 17 U/L (ref 0–44)
AST: 17 U/L (ref 15–41)
Albumin: 3.1 g/dL — ABNORMAL LOW (ref 3.5–5.0)
Alkaline Phosphatase: 54 U/L (ref 38–126)
Anion gap: 11 (ref 5–15)
BUN: 7 mg/dL (ref 6–20)
CO2: 22 mmol/L (ref 22–32)
Calcium: 8.9 mg/dL (ref 8.9–10.3)
Chloride: 106 mmol/L (ref 98–111)
Creatinine, Ser: 0.53 mg/dL — ABNORMAL LOW (ref 0.61–1.24)
GFR, Estimated: >60 mL/min (ref >60)
Glucose, Bld: 210 mg/dL — ABNORMAL HIGH (ref 70–99)
Potassium: 4.2 mmol/L (ref 3.5–5.1)
Sodium: 139 mmol/L (ref 135–145)
Total Bilirubin: 1.2 mg/dL (ref 0.0–1.2)
Total Protein: 6.1 g/dL — ABNORMAL LOW (ref 6.5–8.1)

## 2024-07-14 MED ORDER — MORPHINE SULFATE (PF) 4 MG/ML IV SOLN
4.0000 mg | Freq: Once | INTRAVENOUS | Status: AC
  Administered 2024-07-14: 4 mg via INTRAVENOUS
  Filled 2024-07-14: qty 1

## 2024-07-14 MED ORDER — OXYCODONE HCL 5 MG PO TABS: 5.0000 mg | ORAL_TABLET | ORAL | 0 refills | Status: AC | PRN

## 2024-07-14 MED ORDER — METHYLPREDNISOLONE 4 MG PO TBPK
ORAL_TABLET | ORAL | 0 refills | Status: DC
Start: 2024-07-14 — End: 2024-08-31

## 2024-07-14 NOTE — ED Notes (Signed)
 Patient transported to MRI

## 2024-07-14 NOTE — Discharge Instructions (Signed)
 Neurosurgery reviewed your MRI imaging and do not believe that you need to come in to the hospital.  They would like you to call the office in the morning in order to schedule a sooner appointment for approximately 2 weeks.  They recommended starting you on a steroid Dosepak to be taken as directed on packaging.  I am also sending you with Roxicodone   for pain  until you are able to pick up the pain medicine that was prescribed by neurosurgery.  Please return to the emergency department if you have worsening pain, urinate or defecate on yourself, worsening numbness or tingling, develop chest pain or shortness of breath, passout or believe you need emergent medical care.

## 2024-07-14 NOTE — ED Provider Notes (Signed)
 [facility] Provider Note   CSN: [id number] Arrival date & time: 07/14/24  1653     Patient presents with: Neck Pain   Theodore Hinton is a 58 y.o. male past medical history significant for hypertension, hyperlipidemia, 80s, history of CVA, cervical myelopathy status post anterior cervical discectomy and fusion 8//2025 who presents emergency department for neck pain.  Patient states that he was discharged on 8/7 and returned home however he experienced 2 falls today.  Patient states that these were mechanical falls and denies symptoms including chest pain, shortness of breath, lightheadedness, nausea, vomiting, abdominal pain.  Patient states that after falling at home he has been experiencing worsening cervical pain as well as numbness and tingling that is shooting down his left arm.  Patient states that with his history of CVA he previously had right-sided deficits.  Patient states that he also had left-sided deficits however those have improved.  Patient denies associated bowel or bladder incontinence, low back pain, no anesthesia or fevers.    Neck Pain      Prior to Admission medications   Medication Sig Start Date End Date Taking? Authorizing Provider  methylPREDNISolone  (MEDROL  DOSEPAK) 4 MG TBPK tablet Take as directed on packaging 07/14/24  Yes Nada Chroman, DO  oxyCODONE  (ROXICODONE ) 5 MG immediate release tablet Take 1 tablet (5 mg total) by mouth every 4 (four) hours as needed for up to 3 days. 07/14/24 07/17/24 Yes Nada Chroman, DO  amLODipine  (NORVASC ) 10 MG tablet Take 1 tablet (10 mg total) by mouth daily. 07/31/23   Henderly, Britni A, PA-C  atorvastatin  (LIPITOR ) 80 MG tablet Take 1 tablet (80 mg total) by mouth daily. Patient not taking: Reported on 07/03/2024 06/21/22   Bailey-Modzik, Ples A, NP  ezetimibe  (ZETIA ) 10 MG tablet Take 1 tablet (10 mg total) by mouth daily. Patient not taking: Reported on 07/03/2024 12/20/22    Lue Elsie BROCKS, MD  gabapentin  (NEURONTIN ) 300 MG capsule Take 1 capsule (300 mg total) by mouth 3 (three) times daily. 03/09/23   Sethi, Pramod S, MD  HYDROcodone -acetaminophen  (NORCO/VICODIN) 5-325 MG tablet Take 1 tablet by mouth every 4 (four) hours as needed for moderate pain (pain score 4-6). 07/11/24   Meyran, Suzen Lacks, NP  lamoTRIgine  (LAMICTAL ) 25 MG tablet Take 1 tablet (25 mg total) by mouth daily. Patient not taking: Reported on 07/03/2024 06/26/23   Carita Senior, MD  lisinopril  (ZESTRIL ) 20 MG tablet Take 1 tablet (20 mg total) by mouth daily. Patient not taking: Reported on 07/03/2024 12/21/22   Lue Elsie BROCKS, MD  metFORMIN  (GLUCOPHAGE ) 500 MG tablet Take 500 mg by mouth 2 (two) times daily. Patient not taking: Reported on 07/03/2024    [provider]  metoprolol  tartrate (LOPRESSOR ) 50 MG tablet Take 50 mg by mouth 2 (two) times daily. Patient not taking: Reported on 07/03/2024 01/24/23   [provider]  naloxone [facility]) nasal spray 4 mg/0.1 mL Place 1 spray into the nose as directed. Patient not taking: Reported on 07/03/2024 05/23/24   [provider]  ondansetron  (ZOFRAN ) 4 MG tablet Take 1 tablet (4 mg total) by mouth every 8 (eight) hours as needed for nausea or vomiting. Patient not taking: Reported on 07/03/2024 11/01/22   Towana Ozell BROCKS, MD  tiZANidine  (ZANAFLEX ) 4 MG tablet Take 1 tablet (4 mg total) by mouth 3 (three) times daily as needed for muscle spasms. 07/11/24   Meyran, Suzen Lacks, NP  TUMS E-X 750 750 MG chewable  tablet Chew 1 tablet by mouth 2 (two) times daily as needed for heartburn. Patient not taking: Reported on 07/03/2024    [provider]    Allergies: Patient has no known allergies.    Review of Systems  Musculoskeletal:  Positive for neck pain.    Updated Vital Signs BP (!) 179/97 (BP Location: Right Arm)   Pulse 83   Temp 98.7 F (37.1 C) (Oral)   Resp 16   Ht 5' 8 (1.727 m)   Wt 80.7  kg   SpO2 98%   BMI 27.06 kg/m   Physical Exam Vitals reviewed.  Constitutional:      Appearance: He is not ill-appearing.  HENT:     Head:     Comments: Anterior neck with surgical wound that is hemostatic without surrounding edema, erythema or purulent drainage Eyes:     General: No visual field deficit. Neck:     Comments: Midline cervical spine tenderness to palpation without obvious deformities or step-offs.  Upper thoracic midline tenderness to palpation without obvious deformities or step-offs Neurological:     Mental Status: He is alert and oriented to person, place, and time.     Cranial Nerves: Cranial nerves 2-12 are intact. No cranial nerve deficit, dysarthria or facial asymmetry.     Motor: Weakness present.     Deep Tendon Reflexes: Reflexes normal.     Comments: Bilateral upper extremity strength 4/5.  Sensation intact.  Bilateral upper extremities neurovascularly intact.  Right lower extremity strength 4/5.   sensory deficit that is patient's baseline and nonacute.  Neurovascularly intact.  Left lower extremity strength 4/5 that is acute.  Sensation intact.  Neurovascularly intact.  Psychiatric:        Behavior: Behavior is cooperative.     (all labs ordered are listed, but only abnormal results are displayed) Labs Reviewed  CBC WITH DIFFERENTIAL/PLATELET - Abnormal; Notable for the following components:      Result Value   WBC 10.6 (*)    Hemoglobin 17.1 (*)    Eosinophils Absolute 0.6 (*)    All other components within normal limits  COMPREHENSIVE METABOLIC PANEL WITH GFR - Abnormal; Notable for the following components:   Glucose, Bld 210 (*)    Creatinine, Ser 0.53 (*)    Total Protein 6.1 (*)    Albumin 3.1 (*)    All other components within normal limits    EKG: None  Radiology: MR THORACIC SPINE WO CONTRAST Result Date: 07/14/2024 CLINICAL DATA:  Initial evaluation for new focal neuro deficits. No other relevant history provided. EXAM: MRI  THORACIC SPINE WITHOUT CONTRAST TECHNIQUE: Multiplanar, multisequence MR imaging of the thoracic spine was performed. No intravenous contrast was administered. COMPARISON:  MRI from 07/03/2024 FINDINGS: Alignment: Straightening of the normal thoracic kyphosis. No listhesis. Vertebrae: Chronic compression deformity involving the L1 vertebral body, stable. Vertebral body height otherwise maintained without acute or chronic fracture. Bone marrow signal intensity within normal limits. No worrisome osseous lesions. No abnormal marrow edema. Cord: Normal signal and morphology. No convincing cord signal changes on this motion degraded exam. Paraspinal and other soft tissues: Unremarkable. Disc levels: T3-4: Small right paracentral disc protrusion flattens the right ventral thecal sac without significant stenosis. T7-8: Central disc extrusion with slight superior and inferior migration. No significant spinal stenosis. Foramina remain patent. No other significant disc pathology seen elsewhere within the thoracic spine. Mild multilevel facet hypertrophy. No significant spinal stenosis. Foramina remain patent. IMPRESSION: 1. No acute abnormality within the thoracic spine  or spinal cord. 2. Small disc protrusions at T3-4 and T7-8 without significant stenosis. 3. Chronic compression deformity involving the L1 vertebral body, stable. Electronically Signed   By: Morene Hoard M.D.   On: 07/14/2024 21:31   MR Cervical Spine Wo Contrast Result Date: 07/14/2024 CLINICAL DATA:  Initial evaluation for new neurologic deficits. No other relevant history is provided. EXAM: MRI CERVICAL SPINE WITHOUT CONTRAST TECHNIQUE: Multiplanar, multisequence MR imaging of the cervical spine was performed. No intravenous contrast was administered. COMPARISON:  Prior study from 07/03/2024 FINDINGS: Alignment: Straightening of the normal cervical lordosis. Trace anterolisthesis of C2 on C3. Vertebrae: Postoperative changes from recent ACDF at  C3 through C6. Vertebral body height maintained without acute or chronic fracture. Bone marrow signal intensity overall within normal limits. No worrisome osseous lesions. No abnormal marrow edema. Cord: Normal signal and morphology. No convincing cord signal changes. No significant epidural collection. Posterior Fossa, vertebral arteries, paraspinal tissues: Visualized brain and posterior fossa within normal limits. Craniocervical junction within normal limits. Diffuse swelling and edema within the prevertebral/retropharyngeal soft tissues related to recent ACDF. Probable superimposed blood products present within this region as well. Edema and swelling noted extending along the surgical approach within the right neck as well. Preserved flow voids seen within the vertebral arteries bilaterally. Disc levels: C2-C3: Mild uncovertebral spurring without significant disc bulge. Right greater than left facet hypertrophy. No spinal stenosis. Mild left C3 foraminal narrowing. Right neural foramen remains patent. C3-C4: Prior ACDF. Flattening of the ventral thecal sac with residual moderate spinal stenosis, slightly worsened from preoperative exam. Left greater than right uncovertebral spurring with severe left and moderate right C4 foraminal stenosis. C4-C5: Prior fusion. Flattening of the ventral thecal sac with residual moderate to severe spinal stenosis, slightly worse on the left, and worsened from prior. Cord flattening without convincing cord signal changes. Uncovertebral spurring with severe bilateral C5 foraminal narrowing. C5-C6: Prior fusion. Flattening of the ventral thecal sac, asymmetric to the left. Secondary cord flattening without visible cord signal changes. Severe spinal stenosis, slightly worsened from preoperative exam. Bilateral uncovertebral spurring with severe left worse than right C6 foraminal narrowing. C6-C7: Right eccentric disc osteophyte complex. Superimposed small soft right subarticular disc  protrusion with slight inferior migration (series 5, image 6). No more than mild right-sided spinal stenosis. Uncovertebral spurring with moderate right C7 foraminal stenosis. Left neural foramen remains patent. C7-T1: Negative interspace. Mild right-sided facet hypertrophy. No canal or foraminal stenosis. IMPRESSION: 1. Postoperative changes from recent ACDF at C3 through C6. Residual moderate to severe spinal stenosis at the operative levels slightly worsened as compared to prior exam from 07/03/2024. Secondary diffuse cord flattening at these levels without convincing cord signal changes. 2. Diffuse swelling and edema with probable blood products within the prevertebral/retropharyngeal soft tissues, consistent with recent surgery. No loculated collections by MRI. 3. Multifactorial degenerative changes with resultant multilevel foraminal narrowing as above. Notable findings include severe left with moderate right C4 foraminal stenosis, severe bilateral C5 and C6 foraminal narrowing, with moderate right C7 foraminal stenosis. Electronically Signed   By: Morene Hoard M.D.   On: 07/14/2024 21:21     Procedures   Medications Ordered in the ED  morphine  (PF) 4 MG/ML injection 4 mg (4 mg Intravenous Given 07/14/24 2053)  morphine  (PF) 4 MG/ML injection 4 mg (4 mg Intravenous Given 07/14/24 2213)    Clinical Course as of 07/14/24 2243  Sun Jul 14, 2024  2134 S/p anterior cervical discectomy and fusion 2/2 cervical myelopathy [AG]  2142 NSG consulted [AG]    Clinical Course User Index [AG] Nada Chroman, DO                                 Medical Decision Making Amount and/or Complexity of Data Reviewed Labs: ordered. Radiology: ordered.  Risk Prescription drug management.   On initial valuation patient is hemodynamically stable, afebrile and is in mild distress secondary to cervical spine pain.  Based on patient's history, physical examination findings differential diagnosis include  acute fracture, acute dislocation, ligamentous injury, spinal cord compression, epidural hematoma.  Will obtain laboratory studies as well as MRI of cervical and thoracic spine in the setting of new neurologic deficit and worsening left upper extremity numbness and tingling.  Patient pain controlled.  Cervical and thoracic spine MRI imaging obtained.  With evidence of worsening stenosis compared to prior imaging.  Neurosurgery consulted.  Neurosurgery evaluated patient's MR imaging with recommendations that patient does not need to be hospitalized at this time and can be followed in the outpatient setting.  Neurosurgery with recommendations to give patient steroids and pain control.  Plan of care discussed with patient who agrees with and understands plan of care at discharge.  He was given strict return precautions and will call neurosurgery office to schedule an appointment for 2 weeks from now based upon neurosurgery recommendations.     Final diagnoses:  Neck pain    ED Discharge Orders          Ordered    oxyCODONE  (ROXICODONE ) 5 MG immediate release tablet  Every 4 hours PRN        07/14/24 2158    methylPREDNISolone  (MEDROL  DOSEPAK) 4 MG TBPK tablet        07/14/24 2158            Chroman Nada DO Emergency Medicine PGY2    Nada Chroman, DO 07/14/24 2243    Theodore Lamar BROCKS, MD 07/19/24 1037

## 2024-07-14 NOTE — ED Triage Notes (Signed)
 Pt had cervical spine surgery last week, was discharged 3 days ago but pt c.o severe neck pain, was not able to get his rx filled at the pharmacy for pain medication. Pt also reports 2 falls since discharge, no injuries from the falls. Swelling noted to his neck around bandage/incision site.

## 2024-07-14 NOTE — ED Notes (Signed)
 Dr Yolande aware of bp.  States ok to discharge.

## 2024-08-07 NOTE — Therapy (Incomplete)
 OUTPATIENT PHYSICAL THERAPY NECK EVALUATION   Patient Name: Theodore Hinton MRN: [id number] DOB:20-Nov-1966, 58 y.o., male Today's Date: 08/07/2024  END OF SESSION:   Past Medical History:  Diagnosis Date   Back injury    Chronic back pain    Diabetes mellitus without complication (HCC)    Diverticulosis    Hypertension    Stroke [facility])    Past Surgical History:  Procedure Laterality Date   ANTERIOR CERVICAL DECOMP/DISCECTOMY FUSION N/A 07/08/2024   Procedure: ANTERIOR CERVICAL DECOMPRESSION/DISCECTOMY FUSION 3 LEVELS;  Surgeon: Onetha Kuba, MD;  Location: [facility] OR;  Service: Neurosurgery;  Laterality: N/A;  ACDF C3-6   Patient Active Problem List   Diagnosis Date Noted   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  ONSET DATE: ***  FOLLOW-UP APPT SCHEDULED WITH REFERRING PROVIDER: {yes/no:20286}  SUBJECTIVE:                                                                                                                                                                                         SUBJECTIVE STATEMENT:    Patient arrives to OPPT with a chief concern of neck pain.   PERTINENT HISTORY:   Theodore Hinton is a 58 y.o. male reporting to OPPT with neck pain. Patient is s/p ACDF C3-6 07/08/2024. Patient was discharged back home 08/07 and reported 2 falls. Following the falls the patient  reports that cervical pain has worsened and numbness/tingling has occurred in his left arm. Previous dx of CVA with right sided impairments.  Pain aggravated with ***. Pain relieved with ***.   Patient denies nausea, vomiting, paraesthesia, changes to vision/hearing.   Imaging (Per Chart Review):  CLINICAL DATA:  Initial evaluation for new neurologic deficits. No other relevant history is provided.   EXAM: MRI CERVICAL SPINE WITHOUT CONTRAST   TECHNIQUE: Multiplanar, multisequence MR imaging of the cervical spine was performed. No intravenous contrast was administered.   COMPARISON:  Prior study from 07/03/2024   FINDINGS: Alignment: Straightening of the normal cervical lordosis. Trace anterolisthesis of C2 on C3.   Vertebrae: Postoperative changes from recent ACDF at C3 through C6. Vertebral body  height maintained without acute or chronic fracture. Bone marrow signal intensity overall within normal limits. No worrisome osseous lesions. No abnormal marrow edema.   Cord: Normal signal and morphology. No convincing cord signal changes. No significant epidural collection.   Posterior Fossa, vertebral arteries, paraspinal tissues: Visualized brain and posterior fossa within normal limits. Craniocervical junction within normal limits. Diffuse swelling and edema within the prevertebral/retropharyngeal soft tissues related to recent ACDF. Probable superimposed blood products present within this region as well. Edema and swelling noted extending along the surgical approach within the right neck as well. Preserved flow voids seen within the vertebral arteries bilaterally.   Disc levels:   C2-C3: Mild uncovertebral spurring without significant disc bulge. Right greater than left facet hypertrophy. No spinal stenosis. Mild left C3 foraminal narrowing. Right neural foramen remains patent.   C3-C4: Prior ACDF. Flattening of the ventral thecal sac with residual moderate spinal  stenosis, slightly worsened from preoperative exam. Left greater than right uncovertebral spurring with severe left and moderate right C4 foraminal stenosis.   C4-C5: Prior fusion. Flattening of the ventral thecal sac with residual moderate to severe spinal stenosis, slightly worse on the left, and worsened from prior. Cord flattening without convincing cord signal changes. Uncovertebral spurring with severe bilateral C5 foraminal narrowing.   C5-C6: Prior fusion. Flattening of the ventral thecal sac, asymmetric to the left. Secondary cord flattening without visible cord signal changes. Severe spinal stenosis, slightly worsened from preoperative exam. Bilateral uncovertebral spurring with severe left worse than right C6 foraminal narrowing.   C6-C7: Right eccentric disc osteophyte complex. Superimposed small soft right subarticular disc protrusion with slight inferior migration (series 5, image 6). No more than mild right-sided spinal stenosis. Uncovertebral spurring with moderate right C7 foraminal stenosis. Left neural foramen remains patent.   C7-T1: Negative interspace. Mild right-sided facet hypertrophy. No canal or foraminal stenosis.   IMPRESSION: 1. Postoperative changes from recent ACDF at C3 through C6. Residual moderate to severe spinal stenosis at the operative levels slightly worsened as compared to prior exam from 07/03/2024. Secondary diffuse cord flattening at these levels without convincing cord signal changes. 2. Diffuse swelling and edema with probable blood products within the prevertebral/retropharyngeal soft tissues, consistent with recent surgery. No loculated collections by MRI. 3. Multifactorial degenerative changes with resultant multilevel foraminal narrowing as above. Notable findings include severe left with moderate right C4 foraminal stenosis, severe bilateral C5 and C6 foraminal narrowing, with moderate right C7 foraminal stenosis.      Electronically Signed   By: Theodore Hinton M.D.   On: 07/14/2024 21:21  PAIN:    Pain Intensity: Present: /10, Best: /10, Worst: /10 Pain location: *** Pain Quality: {PAIN DESCRIPTION:21022940}  Radiating: Yes  Numbness/Tingling: Yes Focal Weakness: {yes/no:20286}  PRECAUTIONS: Fall; Cervical Precautions not reported on referral  WEIGHT BEARING RESTRICTIONS: No  FALLS: Has patient fallen in last 6 months? Yes. Number of falls ***  Living Environment Lives with: lives with their family Lives in: Mobile home Stairs: Ramp Entrance Has following equipment at home: Single point cane, Environmental consultant - 2 wheeled, shower chair, Grab bars, and Ramped entry  Prior level of function: Independent  Occupational demands:   Hobbies:   Patient Goals: ***   OBJECTIVE:   Patient Surveys  NDI:   Cognition WNL    Gross Musculoskeletal Assessment Tremor: None Bulk: Normal Tone: Normal Midline Cervical Spine tenderness to palpation but without step-off or deformities.    Gait   Posture Seated:    AROM AROM (Normal range  in degrees) AROM  Cervical  Flexion (50)   Extension (80)   Right lateral flexion (45)   Left lateral flexion (45)   Right rotation (85)   Left rotation (85)   (* = pain; Blank rows = not tested)  PROM PROM (Normal range in degrees) PROM  Cervical  Flexion (50)   Extension (80)   Right lateral flexion (45)   Left lateral flexion (45)   Right rotation (85)   Left rotation (85)   (* = pain; Blank rows = not tested)  MMT MMT (out of 5) Right Left  Cervical (isometric)  Flexion WNL  Extension WNL  Lateral Flexion WNL WNL  Rotation WNL WNL      Shoulder   Flexion    Extension    Abduction    Internal rotation    Horizontal abduction    Horizontal adduction    Lower Trapezius    Rhomboids        Elbow  Flexion    Extension    Pronation    Supination        Wrist  Flexion    Extension    Radial deviation    Ulnar deviation         MCP  Flexion    Extension    Abduction    Adduction    (* = pain; Blank rows = not tested)  Sensation Grossly intact to light touch bilateral UE as determined by testing dermatomes C2-T2. Proprioception and hot/cold testing deferred on this date.  Reflexes R/L Elbow: 2+/2+  Brachioradialis: 2+/2+  Tricep: 2+/2+  Palpation Location LEFT  RIGHT           Suboccipitals    Cervical paraspinals    Upper Trapezius    Levator Scapulae    Rhomboid Major/Minor    (Blank rows = not tested) Graded on 0-4 scale (0 = no pain, 1 = pain, 2 = pain with wincing/grimacing/flinching, 3 = pain with withdrawal, 4 = unwilling to allow palpation), (Blank rows = not tested)  Repeated Movements No centralization or peripheralization of symptoms with repeated cervical protraction and retraction.   Passive Accessory Intervertebral Motion Deferred  Clinical Prediction Rules  Diagnostic Cervical Radiculopathy ULTTa: Any one of the following: A) symptom reproduction; B) side-to-side difference >10 degrees in elbow extension; or C) with regard to involved/painful side: ipsilateral neck lateral flexion decreases symptoms and/or contralateral neck lateral flexion increases symptoms.  ROM: involved-side cervical rotation range of motion less than 60 degrees Distraction test: symptom reduction.  Spurling's A: symptom reproduction.   Number of Positive Criteria  Sensitivity  Specificity  Pos LR  Neg LR   Two  0.39 0.56 0.88 1.09   Three  0.39  0.94  6.1 0.65   Four  0.24 0.99  30.3 0.77   Pos LR = positive likelihood ratio. Neg LR = negative likelihood ratio.    TODAY'S TREATMENT: DATE: 08/07/24   PATIENT EDUCATION:  Education details: HEP, Prognosis, POC,  Person educated: Patient and Parent Education method: Explanation, Demonstration, and Handouts Education comprehension: verbalized understanding and returned demonstration   HOME EXERCISE PROGRAM:    ASSESSMENT:  CLINICAL  IMPRESSION: Mr Solmon Bohr is a 58 y.o. male who was seen today for physical therapy evaluation and treatment for neck pain.   OBJECTIVE IMPAIRMENTS: {opptimpairments:25111}.   ACTIVITY LIMITATIONS: {activitylimitations:27494}  PARTICIPATION LIMITATIONS: {participationrestrictions:25113}  PERSONAL FACTORS: {Personal factors:25162} are also affecting patient's functional outcome.   REHAB POTENTIAL: {rehabpotential:25112}  CLINICAL DECISION MAKING: {  clinical decision making:25114}  EVALUATION COMPLEXITY: {Evaluation complexity:25115}   GOALS: Goals reviewed with patient? {yes/no:20286}  SHORT TERM GOALS: Target date: {follow up:25551}  Pt will be independent with HEP to improve strength and decrease neck pain to improve pain-free function at home and work. Baseline: *** Goal status: INITIAL   LONG TERM GOALS: Target date: {follow up:25551}  Pt will increase  Baseline:  Goal status: INITIAL  2.  Pt will decrease worst neck pain by at least 2 points on the NPRS in order to demonstrate clinically significant reduction in neck pain. Baseline: *** Goal status: INITIAL  3.  Pt will decrease NDI score by at least 19% in order demonstrate clinically significant reduction in neck pain/disability.       Baseline: *** Goal status: INITIAL  4.  *** Baseline: *** Goal status: INITIAL   PLAN:  PT FREQUENCY: {rehab frequency:25116}  PT DURATION: {rehab duration:25117}  PLANNED INTERVENTIONS: {rehab planned interventions:25118::97110-Therapeutic exercises,97530- Therapeutic [phone]- Neuromuscular re-education,97535- Self Rjmz,02859- Manual therapy}  PLAN FOR NEXT SESSION: ***    Lonni Pall PT, DPT Physical Therapist- [place]  08/07/2024, 8:55 PM

## 2024-08-08 ENCOUNTER — Ambulatory Visit

## 2024-08-20 ENCOUNTER — Ambulatory Visit

## 2024-08-23 ENCOUNTER — Ambulatory Visit: Admitting: Endocrinology

## 2024-08-26 ENCOUNTER — Encounter

## 2024-08-28 ENCOUNTER — Encounter

## 2024-08-31 ENCOUNTER — Other Ambulatory Visit: Payer: Self-pay

## 2024-08-31 ENCOUNTER — Emergency Department
Admission: EM | Admit: 2024-08-31 | Discharge: 2024-08-31 | Disposition: A | Discharge status: Home / Self Care | Attending: Emergency Medicine | Admitting: Emergency Medicine

## 2024-08-31 ENCOUNTER — Emergency Department

## 2024-08-31 ENCOUNTER — Encounter: Payer: Self-pay | Admitting: *Deleted

## 2024-08-31 DIAGNOSIS — R059 Cough, unspecified: Secondary | ICD-10-CM | POA: Insufficient documentation

## 2024-08-31 DIAGNOSIS — E119 Type 2 diabetes mellitus without complications: Secondary | ICD-10-CM | POA: Insufficient documentation

## 2024-08-31 DIAGNOSIS — J0430 Supraglottitis, unspecified, without obstruction: Secondary | ICD-10-CM | POA: Insufficient documentation

## 2024-08-31 DIAGNOSIS — R1310 Dysphagia, unspecified: Secondary | ICD-10-CM | POA: Diagnosis present

## 2024-08-31 DIAGNOSIS — I10 Essential (primary) hypertension: Secondary | ICD-10-CM | POA: Insufficient documentation

## 2024-08-31 LAB — CBC
HCT: 46.6 % (ref 39.0–52.0)
Hemoglobin: 16.2 g/dL (ref 13.0–17.0)
MCH: 30.5 pg (ref 26.0–34.0)
MCHC: 34.8 g/dL (ref 30.0–36.0)
MCV: 87.8 fL (ref 80.0–100.0)
Platelets: 181 K/uL (ref 150–400)
RBC: 5.31 MIL/uL (ref 4.22–5.81)
RDW: 14.6 % (ref 11.5–15.5)
WBC: 8.3 K/uL (ref 4.0–10.5)
nRBC: 0 % (ref 0.0–0.2)

## 2024-08-31 LAB — BASIC METABOLIC PANEL WITH GFR
Anion gap: 11 (ref 5–15)
BUN: 6 mg/dL (ref 6–20)
CO2: 25 mmol/L (ref 22–32)
Calcium: 8.6 mg/dL — ABNORMAL LOW (ref 8.9–10.3)
Chloride: 98 mmol/L (ref 98–111)
Creatinine, Ser: 0.83 mg/dL (ref 0.61–1.24)
GFR, Estimated: >60 mL/min (ref >60)
Glucose, Bld: 256 mg/dL — ABNORMAL HIGH (ref 70–99)
Potassium: 3.2 mmol/L — ABNORMAL LOW (ref 3.5–5.1)
Sodium: 134 mmol/L — ABNORMAL LOW (ref 135–145)

## 2024-08-31 LAB — RESP PANEL BY RT-PCR (RSV, FLU A&B, COVID)  RVPGX2
Influenza A by PCR: NEGATIVE
Influenza B by PCR: NEGATIVE
Resp Syncytial Virus by PCR: NEGATIVE
SARS Coronavirus 2 by RT PCR: NEGATIVE

## 2024-08-31 LAB — LACTIC ACID, PLASMA
Lactic Acid, Venous: 2.3 mmol/L (ref 0.5–1.9)
Lactic Acid, Venous: 2.2 mmol/L (ref 0.5–1.9)

## 2024-08-31 LAB — GROUP A STREP BY PCR: Group A Strep by PCR: NOT DETECTED

## 2024-08-31 MED ORDER — IOHEXOL 300 MG/ML  SOLN
75.0000 mL | Freq: Once | INTRAMUSCULAR | Status: AC | PRN
  Administered 2024-08-31: 75 mL via INTRAVENOUS

## 2024-08-31 MED ORDER — MORPHINE SULFATE (PF) 4 MG/ML IV SOLN
4.0000 mg | Freq: Once | INTRAVENOUS | Status: AC
  Administered 2024-08-31: 4 mg via INTRAVENOUS
  Filled 2024-08-31: qty 1

## 2024-08-31 MED ORDER — AMOXICILLIN-POT CLAVULANATE 875-125 MG PO TABS
1.0000 | ORAL_TABLET | Freq: Two times a day (BID) | ORAL | 0 refills | Status: AC
Start: 2024-08-31 — End: 2024-09-07

## 2024-08-31 MED ORDER — DEXAMETHASONE SODIUM PHOSPHATE 10 MG/ML IJ SOLN
10.0000 mg | Freq: Once | INTRAMUSCULAR | Status: AC
  Administered 2024-08-31: 10 mg via INTRAVENOUS
  Filled 2024-08-31: qty 1

## 2024-08-31 MED ORDER — KETOROLAC TROMETHAMINE 30 MG/ML IJ SOLN
15.0000 mg | Freq: Once | INTRAMUSCULAR | Status: DC
Start: 2024-08-31 — End: 2024-08-31

## 2024-08-31 MED ORDER — PREDNISONE 10 MG (21) PO TBPK: ORAL_TABLET | ORAL | 0 refills | Status: AC

## 2024-08-31 MED ORDER — AMOXICILLIN-POT CLAVULANATE 875-125 MG PO TABS
1.0000 | ORAL_TABLET | Freq: Once | ORAL | Status: AC
  Administered 2024-08-31: 1 via ORAL
  Filled 2024-08-31: qty 1

## 2024-08-31 NOTE — ED Notes (Signed)
 Off floor to CT

## 2024-08-31 NOTE — ED Notes (Addendum)
Off floor to X-Ray 

## 2024-08-31 NOTE — ED Provider Notes (Signed)
 [facility] Provider Note   Event Date/Time   First MD Initiated Contact with Patient 08/31/24 1959     (approximate) History  Shortness of Breath  HPI Theodore Hinton is a 58 y.o. male with a stated past medical history of hypertension, type 2 diabetes, chronic back pain, and CVA with no deficits who presents after an ACDF performed on 8//25 complaining of of difficulty swallowing, raspy voice, and a globus sensation that has been present over the last week.  Patient states that this sensation has been intermittent over the last month and has been seeing the surgeon who did this ACDF receiving a taper of prednisone  1 week prior to arrival.  Patient states that the symptoms initially improved however they have worsened significantly over the past 24 hours.  Patient states that he is now having difficulty swallowing and coughed up a pill that he tried to swallow earlier.  Patient denies any fevers, food out of the ordinary, or any rash ROS: Patient currently denies any vision changes, tinnitus, difficulty speaking, facial droop, chest pain, shortness of breath, abdominal pain, nausea/vomiting/diarrhea, dysuria, or weakness/numbness/paresthesias in any extremity   Physical Exam  Triage Vital Signs: ED Triage Vitals [08/31/24 1943]  Encounter Vitals Group     BP 133/88     Girls Systolic BP Percentile      Girls Diastolic BP Percentile      Boys Systolic BP Percentile      Boys Diastolic BP Percentile      Pulse Rate 85     Resp 18     Temp 98.5 F (36.9 C)     Temp Source Oral     SpO2 96 %     Weight      Height      Head Circumference      Peak Flow      Pain Score      Pain Loc      Pain Education      Exclude from Growth Chart    Most recent vital signs: Vitals:   08/31/24 1943 08/31/24 2001  BP: 133/88   Pulse: 85   Resp: 18   Temp: 98.5 F (36.9 C)   SpO2: 96% 96%   General: Awake, oriented x4. CV:  Good peripheral perfusion. Resp:  Normal  effort. Abd:  No distention. Other:  Middle-aged overweight Caucasian male resting comfortably in no acute distress.  Left anterior cervical lymphadenopathy.  Erythematous posterior oropharynx.  No stridor ED Results / Procedures / Treatments  Labs (all labs ordered are listed, but only abnormal results are displayed) Labs Reviewed  BASIC METABOLIC PANEL WITH GFR - Abnormal; Notable for the following components:      Result Value   Sodium 134 (*)    Potassium 3.2 (*)    Glucose, Bld 256 (*)    Calcium  8.6 (*)    All other components within normal limits  LACTIC ACID, PLASMA - Abnormal; Notable for the following components:   Lactic Acid, Venous 2.3 (*)    All other components within normal limits  GROUP A STREP BY PCR  RESP PANEL BY RT-PCR (RSV, FLU A&B, COVID)  [id number]  CBC  LACTIC ACID, PLASMA   RADIOLOGY ED MD interpretation: CT soft tissue of the neck with IV contrast shows minimal thickening/edema about the epiglottis which could reflect early changes of acute supraglottitis with mild narrowing of the airway  2 view chest x-ray shows no evidence of acute abnormalities - All  radiology independently interpreted and agree with radiology assessment Official radiology report(s): CT Soft Tissue Neck W Contrast Result Date: 08/31/2024 CLINICAL DATA:  Initial evaluation for neck swelling, shortness of breath. Prior ACDF approximately 1 month ago. EXAM: CT NECK WITH CONTRAST TECHNIQUE: Multidetector CT imaging of the neck was performed using the standard protocol following the bolus administration of intravenous contrast. RADIATION DOSE REDUCTION: This exam was performed according to the departmental dose-optimization program which includes automated exposure control, adjustment of the mA and/or kV according to patient size and/or use of iterative reconstruction technique. CONTRAST:  75mL OMNIPAQUE  IOHEXOL  300 MG/ML  SOLN COMPARISON:  Prior study from 12/07/2023. FINDINGS: Pharynx and larynx:  Oral cavity within normal limits. Palatine tonsils fairly symmetric without evidence for acute tonsillitis. Multiple calcified tonsilliths noted. Parapharyngeal fat maintained. Nasopharynx within normal limits. There is question mild swelling about the base of the epiglottis, slightly asymmetric to the left, which could reflect early/mild changes of acute supraglottitis (series 2, image 62). Mild narrowing of the supraglottic airway, measuring 3 mm in transverse diameter at its most narrow point (series 2, image 59). Glottis closed inferiorly but grossly symmetric and within normal limits. Subglottic airway clear. Salivary glands: Salivary glands including the parotid and submandibular glands are within normal limits. Thyroid: Normal. Lymph nodes: No enlarged or pathologic lymph nodes within the neck. Vascular: Normal intravascular enhancement seen within the neck. Atheromatous change about the aortic arch, carotid bifurcations, and skull base. Limited intracranial: Unremarkable. Visualized orbits: Unremarkable. Mastoids and visualized paranasal sinuses: Moderate mucosal thickening present throughout the paranasal sinuses. Trace bilateral mastoid effusions noted, of doubtful significance. Middle ear cavities are clear. Skeleton: No discrete or worrisome osseous lesions. Prior ACDF at C3 through C6. Hardware appears well position without complication. Residual mild postoperative stranding along the surgical approach within the right neck, felt to be within normal limits. No collections or other adverse features. Poor dentition noted. Upper chest: No other acute finding. Other: None. IMPRESSION: 1. Question minimal thickening/edema about the epiglottis, which could reflect mild and/or early changes of acute supraglottitis. Secondary mild narrowing of the supraglottic airway, measuring 3 mm in transverse diameter at its most narrow point. 2. Postoperative changes from recent ACDF at C3 through C6. No collections or  other complication identified. 3. Moderate paranasal sinus disease. Aortic Atherosclerosis (ICD10-I70.0). Electronically Signed   By: Morene Hoard M.D.   On: 08/31/2024 22:15   DG Chest 2 View Result Date: 08/31/2024 CLINICAL DATA:  sob EXAM: CHEST - 2 VIEW COMPARISON:  Chest x-ray 06/17/2022, CT chest 07/31/2023 FINDINGS: The heart and mediastinal contours are unchanged. Atherosclerotic plaque. No focal consolidation. No pulmonary edema. No pleural effusion. No pneumothorax. No acute osseous abnormality.  Cervical spine surgical hardware. IMPRESSION: 1. No active cardiopulmonary disease. 2.  Aortic Atherosclerosis (ICD10-I70.0). Electronically Signed   By: Morgane  Naveau M.D.   On: 08/31/2024 20:52   PROCEDURES: Critical Care performed: No Procedures MEDICATIONS ORDERED IN ED: Medications  amoxicillin-clavulanate (AUGMENTIN) 875-125 MG per tablet 1 tablet (has no administration in time range)  dexamethasone  (DECADRON ) injection 10 mg (10 mg Intravenous Given 08/31/24 2025)  morphine  (PF) 4 MG/ML injection 4 mg (4 mg Intravenous Given 08/31/24 2025)  iohexol  (OMNIPAQUE ) 300 MG/ML solution 75 mL (75 mLs Intravenous Contrast Given 08/31/24 2052)  morphine  (PF) 4 MG/ML injection 4 mg (4 mg Intravenous Given 08/31/24 2145)   IMPRESSION / MDM / ASSESSMENT AND PLAN / ED COURSE  I reviewed the triage vital signs and the nursing notes.  The patient is on the cardiac monitor to evaluate for evidence of arrhythmia and/or significant heart rate changes. Patient's presentation is most consistent with acute presentation with potential threat to life or bodily function. Patient is a 59 year old male with the above-stated past medical history who presents for sore throat, difficulty swallowing, and a globus sensation. DDx: Pharyngitis, peritonsillar abscess, medication side effect, strep throat Plan: CBC, CMP, rapid strep, CT soft tissue of the neck Decadron ,  analgesia  Patient states globus sensation is improving after Decadron  administration.  Upon reevaluation, patient's airway is patent without any signs of stridor on exam.  Patient is able to tolerate p.o. liquids and solids as well as taking pill form antibiotics.  Patient encouraged to continue outpatient antibiotics as well as steroids and follow-up with the given ENT.  Patient agrees with plan, all questions were answered prior to discharge.  Patient given strict return precautions  Dispo: Discharge home with ENT follow-up   FINAL CLINICAL IMPRESSION(S) / ED DIAGNOSES   Final diagnoses:  Supraglottitis without airway obstruction   Rx / DC Orders   ED Discharge Orders          Ordered    amoxicillin-clavulanate (AUGMENTIN) 875-125 MG tablet  2 times daily        08/31/24 2234    predniSONE  (STERAPRED UNI-PAK 21 TAB) 10 MG (21) TBPK tablet        08/31/24 2234           Note:  This document was prepared using Dragon voice recognition software and may include unintentional dictation errors.   Jossie Artist POUR, MD 09/05/24 2130

## 2024-08-31 NOTE — Discharge Instructions (Addendum)
 Please use ibuprofen (Motrin) up to 800 mg every 8 hours, naproxen (Naprosyn) up to 500 mg every 12 hours, and/or acetaminophen (Tylenol) up to 4 g/day for any continued pain.  Please do not use this medication regimen for longer than 7 days

## 2024-08-31 NOTE — ED Triage Notes (Signed)
 Pt had cervical surgery about a month ago with Dr.Cram. Sob ongoing x 1 week. Today reports difficulty swallowing. Voice raspy, constant clearing of throat, states feels like something is stuck Lungs clear.

## 2024-09-02 ENCOUNTER — Encounter

## 2024-09-04 ENCOUNTER — Encounter

## 2024-09-09 ENCOUNTER — Encounter

## 2024-09-12 ENCOUNTER — Encounter

## 2024-09-13 ENCOUNTER — Other Ambulatory Visit (HOSPITAL_COMMUNITY): Payer: Self-pay | Admitting: Student

## 2024-09-13 ENCOUNTER — Telehealth (HOSPITAL_COMMUNITY): Payer: Self-pay | Admitting: *Deleted

## 2024-09-13 DIAGNOSIS — R1310 Dysphagia, unspecified: Secondary | ICD-10-CM

## 2024-09-13 NOTE — Telephone Encounter (Signed)
 Attempted to contact patient to schedule swallow test, left phone number for call back (AHARRIS)

## 2024-09-16 ENCOUNTER — Other Ambulatory Visit: Payer: Self-pay | Admitting: Student

## 2024-09-16 DIAGNOSIS — R1310 Dysphagia, unspecified: Secondary | ICD-10-CM

## 2024-09-17 ENCOUNTER — Encounter

## 2024-09-17 ENCOUNTER — Encounter: Payer: Self-pay | Admitting: Student

## 2024-09-20 ENCOUNTER — Ambulatory Visit
Admission: RE | Admit: 2024-09-20 | Discharge: 2024-09-20 | Disposition: A | Discharge status: Home / Self Care | Source: Ambulatory Visit | Attending: Student | Admitting: Student

## 2024-09-20 DIAGNOSIS — R1310 Dysphagia, unspecified: Secondary | ICD-10-CM

## 2024-09-23 ENCOUNTER — Encounter

## 2024-09-25 ENCOUNTER — Encounter

## 2024-09-26 ENCOUNTER — Ambulatory Visit (HOSPITAL_COMMUNITY)
Admission: RE | Admit: 2024-09-26 | Discharge: 2024-09-26 | Disposition: A | Discharge status: Home / Self Care | Source: Ambulatory Visit | Attending: Student | Admitting: Student

## 2024-09-26 DIAGNOSIS — R1310 Dysphagia, unspecified: Secondary | ICD-10-CM

## 2024-09-26 DIAGNOSIS — R09A2 Foreign body sensation, throat: Secondary | ICD-10-CM | POA: Diagnosis present

## 2024-09-26 DIAGNOSIS — R1312 Dysphagia, oropharyngeal phase: Secondary | ICD-10-CM | POA: Diagnosis not present

## 2024-09-26 NOTE — Progress Notes (Signed)
 Modified Barium Swallow Study  Patient Details  Name: Theodore Hinton MRN: [id number] Date of Birth: [birth year]  Today's Date: 09/26/2024  Modified Barium Swallow completed.  Full report located under Chart Review in the Imaging Section.  History of Present Illness Patient is a 58 y.o. male with a stated past medical history of hypertension, type 2 diabetes, chronic back pain, CVA in 2023 and 2024, and ACDF performed on 8/25. Patient presents with complaints of diffculty swallowing, raspy voice, and a globus sensation that has been present since September 2025. Patient denies any fevers, food out of the ordianry, or any rash. MBS ordered to rule out aspiration.   Clinical Impression (P) Patient presents with oropharyngeal dysphagia per this MBS. Suspect decreased oropharyngeal sensation leading to mistiming of epiglottic closure. Aspiration occured intermittently with thin liquids, all aspiration was silent (PAS 8). Patient has a history of decreased pharyngeal sensation from prior stroke in 2024. Patient used chin tuck per SLP recommendation toward the end of session and this proved to be successful in protecting his airway with thin liquids across multiple trials. Throughout the session, there were several instances where patient complained of nasal sensation in absence of any barium seen in nasal cavity. Oral impairments include posterior escape of less than half of bolus during tongue control with thin liquids and delayed swallow initiation to valleculae. SLP educated patient on chin tuck strategy and SLP not recommending any changes to diet consistency. Recommending outpatient SLP to address dysphagia as well as patient's complaints of voice changes.  DIGEST Swallow Severity Rating*  Safety: 2  Efficiency:0  Overall Pharyngeal Swallow Severity: 2 1: mild; 2: moderate; 3: severe; 4: profound  *The Dynamic Imaging Grade of Swallowing Toxicity is standardized for the head and neck cancer  population, however, demonstrates promising clinical applications across populations to standardize the clinical rating of pharyngeal swallow safety and severity.   Swallow Evaluation Recommendations Recommendations: PO diet PO Diet Recommendation: Regular;Thin liquids (Level 0) Liquid Administration via: Spoon;Cup;Straw Medication Administration: Other (Comment) (as tolerated.) Supervision: Patient able to self-feed Swallowing strategies  : Slow rate;Small bites/sips;Chin tuck Postural changes: Position pt fully upright for meals;Stay upright 30-60 min after meals Oral care recommendations: Oral care BID (2x/day)      Damien Hy  Graduate SLP Clinican

## 2024-09-30 ENCOUNTER — Encounter (HOSPITAL_COMMUNITY)

## 2024-09-30 ENCOUNTER — Encounter

## 2024-10-02 ENCOUNTER — Encounter

## 2024-11-05 ENCOUNTER — Encounter (HOSPITAL_COMMUNITY): Payer: Self-pay

## 2024-11-05 ENCOUNTER — Emergency Department (HOSPITAL_COMMUNITY)
Admission: EM | Admit: 2024-11-05 | Discharge: 2024-11-06 | Disposition: A | Attending: Emergency Medicine | Admitting: Emergency Medicine

## 2024-11-05 ENCOUNTER — Emergency Department (HOSPITAL_COMMUNITY)

## 2024-11-05 ENCOUNTER — Other Ambulatory Visit: Payer: Self-pay

## 2024-11-05 DIAGNOSIS — Z8673 Personal history of transient ischemic attack (TIA), and cerebral infarction without residual deficits: Secondary | ICD-10-CM

## 2024-11-05 DIAGNOSIS — R531 Weakness: Secondary | ICD-10-CM

## 2024-11-05 LAB — URINALYSIS, ROUTINE W REFLEX MICROSCOPIC
Bacteria, UA: NONE SEEN
Bilirubin Urine: NEGATIVE
Glucose, UA: 150 mg/dL — AB
Hgb urine dipstick: NEGATIVE
Ketones, ur: NEGATIVE mg/dL
Leukocytes,Ua: NEGATIVE
Nitrite: NEGATIVE
Protein, ur: 30 mg/dL — AB
Specific Gravity, Urine: 1.024 (ref 1.005–1.030)
pH: 7 (ref 5.0–8.0)

## 2024-11-05 LAB — COMPREHENSIVE METABOLIC PANEL WITH GFR
ALT: 27 U/L (ref 0–44)
AST: 27 U/L (ref 15–41)
Albumin: 3.5 g/dL (ref 3.5–5.0)
Alkaline Phosphatase: 59 U/L (ref 38–126)
Anion gap: 4 — ABNORMAL LOW (ref 5–15)
BUN: 10 mg/dL (ref 6–20)
CO2: 24 mmol/L (ref 22–32)
Calcium: 8.5 mg/dL — ABNORMAL LOW (ref 8.9–10.3)
Chloride: 108 mmol/L (ref 98–111)
Creatinine, Ser: 0.63 mg/dL (ref 0.61–1.24)
GFR, Estimated: >60 mL/min (ref >60)
Glucose, Bld: 207 mg/dL — ABNORMAL HIGH (ref 70–99)
Potassium: 4.1 mmol/L (ref 3.5–5.1)
Sodium: 136 mmol/L (ref 135–145)
Total Bilirubin: 1.2 mg/dL (ref 0.0–1.2)
Total Protein: 6.2 g/dL — ABNORMAL LOW (ref 6.5–8.1)

## 2024-11-05 LAB — I-STAT CHEM 8, ED
BUN: 10 mg/dL (ref 6–20)
Calcium, Ion: 1.13 mmol/L — ABNORMAL LOW (ref 1.15–1.40)
Chloride: 105 mmol/L (ref 98–111)
Creatinine, Ser: 0.6 mg/dL — ABNORMAL LOW (ref 0.61–1.24)
Glucose, Bld: 204 mg/dL — ABNORMAL HIGH (ref 70–99)
HCT: 48 % (ref 39.0–52.0)
Hemoglobin: 16.3 g/dL (ref 13.0–17.0)
Potassium: 4.1 mmol/L (ref 3.5–5.1)
Sodium: 139 mmol/L (ref 135–145)
TCO2: 23 mmol/L (ref 22–32)

## 2024-11-05 LAB — CBC WITH DIFFERENTIAL/PLATELET
Abs Immature Granulocytes: 0.03 K/uL (ref 0.00–0.07)
Basophils Absolute: 0.1 K/uL (ref 0.0–0.1)
Basophils Relative: 1 %
Eosinophils Absolute: 0.7 K/uL — ABNORMAL HIGH (ref 0.0–0.5)
Eosinophils Relative: 8 %
HCT: 48.7 % (ref 39.0–52.0)
Hemoglobin: 16.9 g/dL (ref 13.0–17.0)
Immature Granulocytes: 0 %
Lymphocytes Relative: 25 %
Lymphs Abs: 2.2 K/uL (ref 0.7–4.0)
MCH: 32 pg (ref 26.0–34.0)
MCHC: 34.7 g/dL (ref 30.0–36.0)
MCV: 92.2 fL (ref 80.0–100.0)
Monocytes Absolute: 0.6 K/uL (ref 0.1–1.0)
Monocytes Relative: 7 %
Neutro Abs: 5.3 K/uL (ref 1.7–7.7)
Neutrophils Relative %: 59 %
Platelets: 228 K/uL (ref 150–400)
RBC: 5.28 MIL/uL (ref 4.22–5.81)
RDW: 12.9 % (ref 11.5–15.5)
WBC: 8.9 K/uL (ref 4.0–10.5)
nRBC: 0 % (ref 0.0–0.2)

## 2024-11-05 LAB — PROTIME-INR
INR: 1 (ref 0.8–1.2)
Prothrombin Time: 13.4 s (ref 11.4–15.2)

## 2024-11-05 LAB — APTT: aPTT: 23 s — ABNORMAL LOW (ref 24–36)

## 2024-11-05 MED ORDER — OXYCODONE-ACETAMINOPHEN 5-325 MG PO TABS
2.0000 | ORAL_TABLET | Freq: Once | ORAL | Status: AC
  Administered 2024-11-05: 2 via ORAL
  Filled 2024-11-05: qty 2

## 2024-11-05 NOTE — ED Triage Notes (Signed)
 Patient reports weakness and numbness that started day before yesterday, called the doc today who told him to come in. Patient endorses deficits greater than previous stroke baseline, difficulty walking, and falls.

## 2024-11-05 NOTE — ED Provider Notes (Signed)
 [place] [facility]MENT AT [facility] Provider Note   CSN: [id number] Arrival date & time: 11/05/24  1839     Patient presents with: Weakness and Numbness   Theodore Hinton is a 58 y.o. male.  {Add pertinent medical, surgical, social history, OB history to YEP:67052} The history is provided by the patient and medical records.  Weakness  58 year old male with history of hypertension, chronic back pain, diabetes, multiple prior strokes, history of cervical stenosis status post ACDF in August with Dr. Onetha, presenting to the ED for left sided weakness.  Patient reports he first noticed this about 48 hours ago.  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  States normally he walks around just fine on his own, does not have to use a cane or walker.  2 days ago he was walking through the kitchen and just fell into the floor.  He states he seemed to fall straight down on the floor, was not really able to catch himself which was unusual.  States since that time he has seemed to stumble a few other times.  He has noticed some difficulty picking up his left arm, usually using his right arm to assist.  He states left side of his face feels like he was shot with novocaine at the dentist.  He chronically has issues swallowing after his prior neck surgery-- had swallow test 2 weeks ago, told he needed thickened liquids.  Son does report he sounds a bit slurred from baseline.  Prior to Admission medications   Medication Sig Start Date End Date Taking? Authorizing Provider  amLODipine  (NORVASC ) 10 MG tablet Take 1 tablet (10 mg total) by mouth daily. 07/31/23   Henderly, Britni A, PA-C  atorvastatin  (LIPITOR ) 80 MG tablet Take 1 tablet (80 mg total) by mouth daily. Patient not taking: Reported on 07/03/2024 06/21/22   Bailey-Modzik, Ples A, NP  ezetimibe  (ZETIA ) 10 MG tablet Take 1 tablet (10 mg total) by mouth daily. Patient not  taking: Reported on 07/03/2024 12/20/22   Lue Elsie BROCKS, MD  gabapentin  (NEURONTIN ) 300 MG capsule Take 1 capsule (300 mg total) by mouth 3 (three) times daily. 03/09/23   Sethi, Pramod S, MD  HYDROcodone -acetaminophen  (NORCO/VICODIN) 5-325 MG tablet Take 1 tablet by mouth every 4 (four) hours as needed for moderate pain (pain score 4-6). 07/11/24   Meyran, Suzen Lacks, NP  lamoTRIgine  (LAMICTAL ) 25 MG tablet Take 1 tablet (25 mg total) by mouth daily. Patient not taking: Reported on 07/03/2024 06/26/23   Carita Senior, MD  lisinopril  (ZESTRIL ) 20 MG tablet Take 1 tablet (20 mg total) by mouth daily. Patient not taking: Reported on 07/03/2024 12/21/22   Lue Elsie BROCKS, MD  metFORMIN  (GLUCOPHAGE ) 500 MG tablet Take 500 mg by mouth 2 (two) times daily. Patient not taking: Reported on 07/03/2024    [provider]  metoprolol  tartrate (LOPRESSOR ) 50 MG tablet Take 50 mg by mouth 2 (two) times daily. Patient not taking: Reported on 07/03/2024 01/24/23   [provider]  naloxone [facility]) nasal spray 4 mg/0.1 mL Place 1 spray into the nose as directed. Patient not taking: Reported on 07/03/2024 05/23/24   [provider]  ondansetron  (ZOFRAN ) 4 MG tablet Take 1 tablet (4 mg total) by mouth every 8 (eight) hours as needed for nausea or vomiting. Patient not taking: Reported on 07/03/2024 11/01/22   Towana Ozell BROCKS, MD  predniSONE  (STERAPRED UNI-PAK 21 TAB)  10 MG (21) TBPK tablet As directed on packaging 08/31/24   Bradler, Evan K, MD  tiZANidine  (ZANAFLEX ) 4 MG tablet Take 1 tablet (4 mg total) by mouth 3 (three) times daily as needed for muscle spasms. 07/11/24   Meyran, Suzen Lacks, NP  TUMS E-X 750 750 MG chewable tablet Chew 1 tablet by mouth 2 (two) times daily as needed for heartburn. Patient not taking: Reported on 07/03/2024    [provider]    Allergies: Patient has no known allergies.    Review of Systems  Neurological:  Positive for weakness.   All other systems reviewed and are negative.   Updated Vital Signs BP (!) 169/116 (BP Location: Left Arm)   Pulse 87   Temp 98 F (36.7 C)   Resp 17   SpO2 95%   Physical Exam Vitals and nursing note reviewed.  Constitutional:      Appearance: He is well-developed.  HENT:     Head: Normocephalic and atraumatic.  Eyes:     Conjunctiva/sclera: Conjunctivae normal.     Pupils: Pupils are equal, round, and reactive to light.  Cardiovascular:     Rate and Rhythm: Normal rate and regular rhythm.     Heart sounds: Normal heart sounds.  Pulmonary:     Effort: Pulmonary effort is normal. No respiratory distress.     Breath sounds: Normal breath sounds. No rhonchi.  Abdominal:     General: Bowel sounds are normal.     Palpations: Abdomen is soft.  Musculoskeletal:        General: Normal range of motion.     Cervical back: Normal range of motion.     Comments: Orthotic foot drop brace on left foot and lower leg  Skin:    General: Skin is warm and dry.  Neurological:     Mental Status: He is alert and oriented to person, place, and time.     Comments: AAOx3, answering questions and following commands appropriately; RUE/RLE with normal strength/sensation, left grip is 4/5 compared with right, left leg difficulty with plantar and dorsiflexion (baseline though due to foot drop), some blunting at left nasolabial fold, altered sensation to left cheek compared with right, symmetric forehead wrinkle     (all labs ordered are listed, but only abnormal results are displayed) Labs Reviewed  CBC WITH DIFFERENTIAL/PLATELET - Abnormal; Notable for the following components:      Result Value   Eosinophils Absolute 0.7 (*)    All other components within normal limits  COMPREHENSIVE METABOLIC PANEL WITH GFR - Abnormal; Notable for the following components:   Glucose, Bld 207 (*)    Calcium  8.5 (*)    Total Protein 6.2 (*)    Anion gap 4 (*)    All other components within normal limits   APTT - Abnormal; Notable for the following components:   aPTT 23 (*)    All other components within normal limits  URINALYSIS, ROUTINE W REFLEX MICROSCOPIC - Abnormal; Notable for the following components:   Color, Urine AMBER (*)    APPearance CLOUDY (*)    Glucose, UA 150 (*)    Protein, ur 30 (*)    All other components within normal limits  I-STAT CHEM 8, ED - Abnormal; Notable for the following components:   Creatinine, Ser 0.60 (*)    Glucose, Bld 204 (*)    Calcium , Ion 1.13 (*)    All other components within normal limits  PROTIME-INR    EKG: None  Radiology:  DG Chest 2 View Result Date: 11/05/2024 CLINICAL DATA:  TIA. EXAM: CHEST - 2 VIEW COMPARISON:  August 31, 2024 FINDINGS: The heart size and mediastinal contours are within normal limits. Both lungs are clear. Postoperative changes are noted within the lower cervical spine. The visualized skeletal structures are unremarkable. IMPRESSION: No active cardiopulmonary disease. Electronically Signed   By: Suzen Dials M.D.   On: 11/05/2024 20:04   CT Head Wo Contrast Result Date: 11/05/2024 EXAM: CT HEAD WITHOUT CONTRAST 11/05/2024 07:50:25 PM TECHNIQUE: CT of the head was performed without the administration of intravenous contrast. Automated exposure control, iterative reconstruction, and/or weight based adjustment of the mA/kV was utilized to reduce the radiation dose to as low as reasonably achievable. COMPARISON: 07/03/2024 CLINICAL HISTORY: Transient ischemic attack (TIA). FINDINGS: BRAIN AND VENTRICLES: No acute hemorrhage. No evidence of acute infarct. No hydrocephalus. No extra-axial collection. No mass effect or midline shift. Chronic lacunar infarct in the left thalamus. ORBITS: No acute abnormality. SINUSES: Extensive mucosal thickening in the ethmoid air cells and bilateral sphenoid sinuses. Air-fluid levels in the sphenoid sinuses. Additional mucosal thickening of the frontal and maxillary sinuses. No mastoid  effusion. Hyperostosis of the maxillary and sphenoid sinus walls. Findings are compatible with chronic sinusitis. SOFT TISSUES AND SKULL: No acute soft tissue abnormality. No skull fracture. IMPRESSION: 1. No acute intracranial abnormality. 2. Chronic maxillary and sphenoid sinusitis. Possible acute inflammatory component suggested by air-fluid levels in the sphenoid sinuses. Electronically signed by: Norman Gatlin MD 11/05/2024 08:02 PM EST RP Workstation: [id number]    {Document cardiac monitor, telemetry assessment procedure when appropriate:32947} Procedures   Medications Ordered in the ED - No data to display    {Click here for ABCD2, HEART and other calculators REFRESH Note before signing:1}                              Medical Decision Making  ***  {Document critical care time when appropriate  Document review of labs and clinical decision tools ie CHADS2VASC2, etc  Document your independent review of radiology images and any outside records  Document your discussion with family members, caretakers and with consultants  Document social determinants of health affecting pt's care  Document your decision making why or why not admission, treatments were needed:32947:::1}   Final diagnoses:  None    ED Discharge Orders     None

## 2024-11-05 NOTE — ED Provider Triage Note (Signed)
 Emergency Medicine Provider Triage Evaluation Note  Theodore Hinton , a 58 y.o. male  was evaluated in triage.  Pt complains of left-sided facial droop and left-sided weakness starting 48 hours ago.  Also complains of headache.  Does report history of stroke in the past with left-sided deficits that improved but symptoms are consistent with previous stroke.  Review of Systems  Positive: See above Negative: Fever  Physical Exam  BP (!) 169/116 (BP Location: Left Arm)   Pulse 87   Temp 98 F (36.7 C)   Resp 17   SpO2 95%  Gen:   Awake, no distress   Resp:  Normal effort  MSK:   Moves extremities without difficulty  Other:  Left-sided facial droop noted with obvious weakness to the left upper and left lower extremity but is worse than baseline per patient  Medical Decision Making  Medically screening exam initiated at 7:24 PM.  Appropriate orders placed.  Theodore Hinton was informed that the remainder of the evaluation will be completed by another provider, this initial triage assessment does not replace that evaluation, and the importance of remaining in the ED until their evaluation is complete.  58 year old male with past medical history of CVA in the past presenting to the emergency department today with left-sided weakness.  These are the symptoms he had with his prior stroke.  Will further evaluate the patient here with CT scan and labs eval for anemia or electrolyte abnormality as well as urinalysis and chest x-ray to evaluate for infectious etiologies.  The patient is outside the window for acute neurointervention at this time with symptoms > 24 hrs.    Theodore Prentice SAUNDERS, MD 11/05/24 [phone]

## 2024-11-05 NOTE — ED Notes (Signed)
 Patient transported to MRI

## 2024-11-05 NOTE — ED Notes (Signed)
 Dr Ula at bedside for [facility]

## 2024-11-06 DIAGNOSIS — E119 Type 2 diabetes mellitus without complications: Secondary | ICD-10-CM | POA: Insufficient documentation

## 2024-11-06 MED ORDER — CLOPIDOGREL BISULFATE 75 MG PO TABS: 75.0000 mg | ORAL_TABLET | Freq: Every day | ORAL | 0 refills | Status: AC

## 2024-11-06 MED ORDER — GABAPENTIN 300 MG PO CAPS
300.0000 mg | ORAL_CAPSULE | Freq: Once | ORAL | Status: AC
  Administered 2024-11-06: 300 mg via ORAL
  Filled 2024-11-06: qty 1

## 2024-11-06 MED ORDER — AMOXICILLIN 500 MG PO CAPS: 500.0000 mg | ORAL_CAPSULE | Freq: Three times a day (TID) | ORAL | 0 refills | Status: AC

## 2024-11-06 NOTE — Discharge Instructions (Addendum)
 MRI today of brain and neck without new findings of stroke.  Stable neck findings post-operatively. I have refilled your Plavix .  Please continue as directed. Follow-up with neurology--I have placed a referral back to go for neurology. Return to the ED for new or worsening symptoms.

## 2024-12-11 ENCOUNTER — Encounter: Payer: Self-pay | Admitting: Neurology

## 2024-12-11 ENCOUNTER — Ambulatory Visit: Admitting: Neurology

## 2024-12-11 VITALS — BP 176/103 | HR 92 | Ht 68.0 in | Wt 189.2 lb

## 2024-12-11 DIAGNOSIS — M9689 Other intraoperative and postprocedural complications and disorders of the musculoskeletal system: Secondary | ICD-10-CM

## 2024-12-11 DIAGNOSIS — Z8673 Personal history of transient ischemic attack (TIA), and cerebral infarction without residual deficits: Secondary | ICD-10-CM

## 2024-12-11 DIAGNOSIS — M542 Cervicalgia: Secondary | ICD-10-CM

## 2024-12-11 DIAGNOSIS — M21372 Foot drop, left foot: Secondary | ICD-10-CM | POA: Diagnosis not present

## 2024-12-11 DIAGNOSIS — R42 Dizziness and giddiness: Secondary | ICD-10-CM

## 2024-12-11 MED ORDER — ZANAFLEX 2 MG PO CAPS: 2.0000 mg | ORAL_CAPSULE | Freq: Two times a day (BID) | ORAL | 1 refills | Status: AC

## 2024-12-11 NOTE — Patient Instructions (Addendum)
 I had a long d/w patient about his remote lacunar stroke, multifactorial dizziness , gait instability chronic neck and shoulder pain ,risk for recurrent stroke/TIAs, personally independently reviewed imaging studies and stroke evaluation results and answered questions.Continue Plavix  75 mg daily  for secondary stroke prevention and maintain strict control of hypertension with blood pressure goal below 130/90, diabetes with hemoglobin A1c goal below 6.5% and lipids with LDL cholesterol goal below 70 mg/dL. I also advised the patient to eat a healthy diet with plenty of whole grains, cereals, fruits and vegetables, exercise regularly and maintain ideal body weight . SABRA  Continue gabapentin  300 mg 3 times daily for paresthesias and start Zanaflex  2 mg at night for 1 week increase to twice daily to help with muscle spasm.  I also advised him to do regular back stretching exercises as well..  Follow-up with Dr. Arlyss for his chronic neck pain.  Followup in the future with me only as needed.   Neck Exercises Ask your health care provider which exercises are safe for you. Do exercises exactly as told by your health care provider and adjust them as directed. It is normal to feel mild stretching, pulling, tightness, or discomfort as you do these exercises. Stop right away if you feel sudden pain or your pain gets worse. Do not begin these exercises until told by your health care provider. Neck exercises can be important for many reasons. They can improve strength and maintain flexibility in your neck, which will help your upper back and prevent neck pain. Stretching exercises Rotation neck stretching  Sit in a chair or stand up. Place your feet flat on the floor, shoulder-width apart. Slowly turn your head (rotate) to the right until a slight stretch is felt. Turn it all the way to the right so you can look over your right shoulder. Do not tilt or tip your head. Hold this position for 10-30 seconds. Slowly turn  your head (rotate) to the left until a slight stretch is felt. Turn it all the way to the left so you can look over your left shoulder. Do not tilt or tip your head. Hold this position for 10-30 seconds. Repeat __________ times. Complete this exercise __________ times a day. Neck retraction  Sit in a sturdy chair or stand up. Look straight ahead. Do not bend your neck. Use your fingers to push your chin backward (retraction). Do not bend your neck for this movement. Continue to face straight ahead. If you are doing the exercise properly, you will feel a slight sensation in your throat and a stretch at the back of your neck. Hold the stretch for 1-2 seconds. Repeat __________ times. Complete this exercise __________ times a day. Strengthening exercises Neck press  Lie on your back on a firm bed or on the floor with a pillow under your head. Use your neck muscles to push your head down on the pillow and straighten your spine. Hold the position as well as you can. Keep your head facing up (in a neutral position) and your chin tucked. Slowly count to 5 while holding this position. Repeat __________ times. Complete this exercise __________ times a day. Isometrics These are exercises in which you strengthen the muscles in your neck while keeping your neck still (isometrics). Sit in a supportive chair and place your hand on your forehead. Keep your head and face facing straight ahead. Do not flex or extend your neck while doing isometrics. Push forward with your head and neck while pushing  back with your hand. Hold for 10 seconds. Do the sequence again, this time putting your hand against the back of your head. Use your head and neck to push backward against the hand pressure. Finally, do the same exercise on either side of your head, pushing sideways against the pressure of your hand. Repeat __________ times. Complete this exercise __________ times a day. Prone head lifts  Lie face-down (prone  position), resting on your elbows so that your chest and upper back are raised. Start with your head facing downward, near your chest. Position your chin either on or near your chest. Slowly lift your head upward. Lift until you are looking straight ahead. Then continue lifting your head as far back as you can comfortably stretch. Hold your head up for 5 seconds. Then slowly lower it to your starting position. Repeat __________ times. Complete this exercise __________ times a day. Supine head lifts  Lie on your back (supine position), bending your knees to point to the ceiling and keeping your feet flat on the floor. Lift your head slowly off the floor, raising your chin toward your chest. Hold for 5 seconds. Repeat __________ times. Complete this exercise __________ times a day. Scapular retraction  Stand with your arms at your sides. Look straight ahead. Slowly pull both shoulders (scapulae) backward and downward (retraction) until you feel a stretch between your shoulder blades in your upper back. Hold for 10-30 seconds. Relax and repeat. Repeat __________ times. Complete this exercise __________ times a day. Contact a health care provider if: Your neck pain or discomfort gets worse when you do an exercise. Your neck pain or discomfort does not improve within 2 hours after you exercise. If you have any of these problems, stop exercising right away. Do not do the exercises again unless your health care provider says that you can. Get help right away if: You develop sudden, severe neck pain. If this happens, stop exercising right away. Do not do the exercises again unless your health care provider says that you can. This information is not intended to replace advice given to you by your health care provider. Make sure you discuss any questions you have with your health care provider. Document Revised: 05/18/2021 Document Reviewed: 05/18/2021 Elsevier Patient Education  2024 [employer].

## 2024-12-11 NOTE — Progress Notes (Signed)
 " [facility] [address] [place]. [place] [address] [phone]       OFFICE FOLLOW-UP NOTE  Mr. Chaze Hruska Date of Birth:  May 15, 1966 Medical Record Number:  [id number]   HPI: Initial visit 09/08/2022 Mr. Noguez is a 59 year old Caucasian male seen today for initial office follow-up visit for stroke.  History is obtained from the patient and review of electronic medical records and I have personally reviewed pertinent available imaging films in PACS.SABRA  He has past medical history of diabetes, hypertension diverticulosis.  He presented on 06/18/2022 with sudden onset of right-sided facial droop, facial numbness, slurred speech and right arm weakness.  He also felt lightheaded and saw spots in front of his eyes.  [facility] given IV TNK question of risk benefits and and after patient consent.  He was transferred to [facility] had careful neurological follow-up and strict blood pressure control.  He did well and right-sided weakness improved but he had residual numbness in the right face and minor extent the right hand.  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  Echocardiogram showed ejection fraction 65 to 70%.  LDL cholesterol 121 mg percent and hemoglobin A1c was 10.3.  Patient was started on aspirin  Plavix  as well as Lipitor .  He states he has done well since discharge.  He does need physical occupational therapy.  His right-sided strength has improved.  He still has some numbness in the right cheek and left and his taste is altered.  He has very occasional tingling in the right hand.  He has not had any recurrent stroke or TIA symptoms.  He is Recommending alcohol smoke cigarettes.  Patient was advised to stop Plavix  after 3 weeks and stay on aspirin  but he seems to have stopped both of the medications. Update 03/09/2023 : He returns for follow-up after last visit 6 months ago.  Patient states he  was admitted in January 2024 with a new stroke.  He presented on 12/19/2022 with 4-day history of new right facial droop, worsening numbness of the right face right-sided weakness and numbness.  CT head on admission showed no acute abnormality and CT angiogram showed no LVO but severe stenosis of P2 segment of the right PCA.  MRI scan showed a 9 mm acute left parietal cortical infarct.  2D echo showed ejection fraction of 65 to 70% with grade 1 diastolic dysfunction and normal left atrium size.  LDL cholesterol was elevated at 142 mg percent and hemoglobin A1c 9.5.  30-day outpatient cardiac monitoring did not reveal any paroxysmal A-fib.  Patient was started on aspirin  and Plavix  for 3 weeks followed by aspirin .  Patient had worsening of his subjective right-sided numbness.  She has previously had left thalamic infarct in July 2023.  He returned back to the ER on 02/26/2023 with persistent subjective worsening of right-sided deficits.  MRI scan of the brain did not show any acute infarct at this time.  Patient states that he had previously been on gabapentin  but this was stopped few months ago and may have led to worsening of his right-sided deficits.  He also has chronic back pain and is followed at the show left-sided numbness has improved.  s currently on Percocet.  He is not helping with.  He is currently on Plavix  which is tolerating well without bruising or bleeding. Update 12/11/2024 : Patient returns for follow-up after last visit more than a year and a  half ago.  He had spine surgery in August 2025 by Dr. Onetha but unfortunately he has not had significant improvement in his neck and shoulder pain which is persistent.  He had restriction of his neck movements in all directions.  Patient was prescribed Zanaflex  but he has not taken it.  He is currently on gabapentin  300 mg 3 times daily for sciatica and it helps.  He complains of dizziness which is chronic for him but when he moves his neck in any direction.   He also has a foot drop.  He has had several falls.  He had MRI scan of the brain on 11/06/2024 shows old left thalamic infarct and changes of small vessel disease.  MRI scan of the cervical spine showed C3-C6 ACDF multilevel moderate to severe neuroforaminal narrowing.  He has not followed up with Dr. Onetha in recent months.  He continues to do well with stroke standpoint no neurovascular symptoms remains on Plavix  which he is tolerating well without bruising or bleeding he is tolerating Lipitor  well without side effects.  He states his sugars are under good control so I do not see any recent lab work his blood pressure is elevated today at 176/103 upon repeat checking it was 160/100 ROS:   14 system review of systems is positive for numbness, tingling, altered taste all other systems negative  PMH:  Past Medical History:  Diagnosis Date   Back injury    Chronic back pain    Diabetes mellitus without complication (HCC)    Diverticulosis    Hypertension    Stroke [facility])     Social History:  Social History   Socioeconomic History   Marital status: Single    Spouse name: Not on file   Number of children: Not on file   Years of education: Not on file   Highest education level: Not on file  Occupational History   Not on file  Tobacco Use   Smoking status: Never   Smokeless tobacco: Former    Types: Designer, Multimedia Use   Vaping status: Never Used  Substance and Sexual Activity   Alcohol use: Not Currently    Comment: occasionally   Drug use: No   Sexual activity: Not on file  Other Topics Concern   Not on file  Social History Narrative   Not on file   Social Drivers of Health   Tobacco Use: Medium Risk (12/11/2024)   Patient History    Smoking Tobacco Use: Never    Smokeless Tobacco Use: Former    Passive Exposure: Not on Actuary Strain: Medium Risk (08/28/2023)   Received from [facility]   Overall Financial Resource Strain (CARDIA)    Difficulty of Paying  Living Expenses: Somewhat hard  Food Insecurity: No Food Insecurity (07/04/2024)   Epic    Worried About Radiation Protection Practitioner of Food in the Last Year: Never true    Ran Out of Food in the Last Year: Never true  Transportation Needs: No Transportation Needs (07/04/2024)   Epic    Lack of Transportation (Medical): No    Lack of Transportation (Non-Medical): No  Physical Activity: Not on file  Stress: Not on file  Social Connections: Unknown (12/28/2022)   [facility]   Social Network    Social Network: Not on file  Intimate Partner Violence: Not At Risk (07/04/2024)   Epic    Fear of Current or Ex-Partner: No    Emotionally Abused: No  Physically Abused: No    Sexually Abused: No  Depression (PHQ2-9): Not on file  Alcohol Screen: Not on file  Housing: Low Risk (07/04/2024)   Epic    Unable to Pay for Housing in the Last Year: No    Number of Times Moved in the Last Year: 0    Homeless in the Last Year: No  Utilities: Not At Risk (07/04/2024)   Epic    Threatened with loss of utilities: No  Health Literacy: Not on file    Medications:   Current Outpatient Medications on File Prior to Visit  Medication Sig Dispense Refill   amLODipine  (NORVASC ) 10 MG tablet Take 1 tablet (10 mg total) by mouth daily. 30 tablet 0   amoxicillin  (AMOXIL ) 500 MG capsule Take 1 capsule (500 mg total) by mouth 3 (three) times daily. 21 capsule 0   clopidogrel  (PLAVIX ) 75 MG tablet Take 1 tablet (75 mg total) by mouth daily. 30 tablet 0   gabapentin  (NEURONTIN ) 300 MG capsule Take 1 capsule (300 mg total) by mouth 3 (three) times daily. 90 capsule 11   atorvastatin  (LIPITOR ) 80 MG tablet Take 1 tablet (80 mg total) by mouth daily. (Patient not taking: Reported on 12/11/2024) 30 tablet 2   ezetimibe  (ZETIA ) 10 MG tablet Take 1 tablet (10 mg total) by mouth daily. (Patient not taking: Reported on 12/11/2024) 30 tablet 0   HYDROcodone -acetaminophen  (NORCO/VICODIN) 5-325 MG tablet Take 1 tablet by mouth  every 4 (four) hours as needed for moderate pain (pain score 4-6). (Patient not taking: Reported on 12/11/2024) 30 tablet 0   lamoTRIgine  (LAMICTAL ) 25 MG tablet Take 1 tablet (25 mg total) by mouth daily. (Patient not taking: Reported on 12/11/2024) 60 tablet 0   lisinopril  (ZESTRIL ) 20 MG tablet Take 1 tablet (20 mg total) by mouth daily. (Patient not taking: Reported on 12/11/2024) 30 tablet 0   metFORMIN  (GLUCOPHAGE ) 500 MG tablet Take 500 mg by mouth 2 (two) times daily. (Patient not taking: Reported on 12/11/2024)     metoprolol  tartrate (LOPRESSOR ) 50 MG tablet Take 50 mg by mouth 2 (two) times daily. (Patient not taking: Reported on 12/11/2024)     naloxone (NARCAN) nasal spray 4 mg/0.1 mL Place 1 spray into the nose as directed. (Patient not taking: Reported on 12/11/2024)     ondansetron  (ZOFRAN ) 4 MG tablet Take 1 tablet (4 mg total) by mouth every 8 (eight) hours as needed for nausea or vomiting. (Patient not taking: Reported on 12/11/2024) 15 tablet 0   predniSONE  (STERAPRED UNI-PAK 21 TAB) 10 MG (21) TBPK tablet As directed on packaging (Patient not taking: Reported on 12/11/2024) 1 each 0   tiZANidine  (ZANAFLEX ) 4 MG tablet Take 1 tablet (4 mg total) by mouth 3 (three) times daily as needed for muscle spasms. (Patient not taking: Reported on 12/11/2024) 30 tablet 0   TUMS E-X 750 750 MG chewable tablet Chew 1 tablet by mouth 2 (two) times daily as needed for heartburn. (Patient not taking: Reported on 12/11/2024)     No current facility-administered medications on file prior to visit.    Allergies:  No Known Allergies  Physical Exam General: well developed, well nourished middle-aged Caucasian male, seated, in no evident distress Head: head normocephalic and atraumatic.  Neck: supple with no carotid or supraclavicular bruits Cardiovascular: regular rate and rhythm, no murmurs Musculoskeletal: Posterior neck muscle spasm with limited neck movement in all directions.  Left foot drop  skin:  no  rash/petichiae Vascular:  Normal pulses  all extremities Vitals:   12/11/24 1322  BP: (!) 176/103  Pulse: 92  SpO2: 96%   Neurologic Exam Mental Status: Awake and fully alert. Oriented to place and time. Recent and remote memory intact. Attention span, concentration and fund of knowledge appropriate. Mood and affect appropriate.  Cranial Nerves: Fundoscopic exam normal. Pupils equal, briskly reactive to light. Extraocular movements full without nystagmus. Visual fields full to confrontation. Hearing intact. Facial sensation intact. Face, tongue, palate moves normally and symmetrically.  Motor: Normal bulk and tone. Normal strength in all tested extremity muscles.  Diminished fine finger movements on the right.  Orbits left or right upper extremity.  Mild weakness of right grip.  Right leg weakness 4/5.  With weakness of the right hip flexors and ankle dorsiflexors.  Left foot drop with ankle dorsiflexor weakness 3/5. Sensory.:  Subjective hyperesthesia on the right to touch ,  pinprick but preserved.position and vibratory sensation.  Except some decreased sensation in the right lower face. Coordination: Rapid alternating movements normal in all extremities. Finger-to-nose and heel-to-shin performed accurately bilaterally. Gait and Station: Arises from chair without difficulty. Stance is normal.  Left leg with left foot drop Driving.   Reflexes: 1+ and asymmetric and brisker on the right and both medial  .  Toes downgoing.   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  He still has persistent right-sided paresthesias.  Vascular risk factors of diabetes, hyperlipidemia hypertension and intracranial atherosclerosis.  Recent left parietal infarct in January 2024 of cryptogenic etiology.   Persistent chronic poststroke dysesthesias as well as multifactorial combination of degenerative  disease stroke And chronic neck and shoulder pain despite neck surgery in August 2025     PLAN:I had a long d/w patient about his remote lacunar stroke, multifactorial dizziness , gait instability chronic neck and shoulder pain ,risk for recurrent stroke/TIAs, personally independently reviewed imaging studies and stroke evaluation results and answered questions.Continue Plavix  75 mg daily  for secondary stroke prevention and maintain strict control of hypertension with blood pressure goal below 130/90, diabetes with hemoglobin A1c goal below 6.5% and lipids with LDL cholesterol goal below 70 mg/dL. I also advised the patient to eat a healthy diet with plenty of whole grains, cereals, fruits and vegetables, exercise regularly and maintain ideal body weight . SABRA  Continue gabapentin  300 mg 3 times daily for paresthesias and start Zanaflex  2 mg at night for 1 week increase to twice daily to help with muscle spasm.  I also advised him to do regular back stretching exercises as well..  Follow-up with Dr. Onetha for his chronic neck pain.  Followup in the future with me only as needed    I personally spent a total of 40 minutes in the care of the patient today including getting/reviewing separately obtained history, performing a medically appropriate exam/evaluation, counseling and educating, placing orders, referring and communicating with other health care professionals, documenting clinical information in the EHR, independently interpreting results, and coordinating care.        Eather Popp, MD Note: This document was prepared with digital dictation and possible smart phrase technology. Any transcriptional errors that result from this process are unintentional "

## 2024-12-30 ENCOUNTER — Other Ambulatory Visit (HOSPITAL_COMMUNITY): Payer: Self-pay

## 2024-12-30 ENCOUNTER — Telehealth: Payer: Self-pay

## 2024-12-30 NOTE — Telephone Encounter (Signed)
 Pharmacy Patient Advocate Encounter   Received notification from CoverMyMeds that prior authorization for Zanaflex  2MG  capsules is required/requested.   Insurance verification completed.   The patient is insured through [employer].   Prior Authorization for Zanaflex  2MG  capsules has been APPROVED from 12-30-2024 to 12-30-2025. Ran test claim, Copay is $4.00. This test claim was processed through [facility]- copay amounts may vary at other pharmacies due to pharmacy/plan contracts, or as the patient moves through the different stages of their insurance plan.   PA #/Case ID/Reference #: [id number]
